# Patient Record
Sex: Female | Born: 1940 | Race: White | Hispanic: No | Smoking: Current every day smoker
Health system: Southern US, Community
[De-identification: ages and names within clinical notes are randomized; demographics above are authoritative.]

## PROBLEM LIST (undated history)

## (undated) DIAGNOSIS — E119 Type 2 diabetes mellitus without complications: Secondary | ICD-10-CM

## (undated) DIAGNOSIS — M170 Bilateral primary osteoarthritis of knee: Secondary | ICD-10-CM

## (undated) DIAGNOSIS — N201 Calculus of ureter: Secondary | ICD-10-CM

## (undated) DIAGNOSIS — S72342D Displaced spiral fracture of shaft of left femur, subsequent encounter for closed fracture with routine healing: Secondary | ICD-10-CM

## (undated) DIAGNOSIS — K7460 Unspecified cirrhosis of liver: Secondary | ICD-10-CM

## (undated) DIAGNOSIS — I447 Left bundle-branch block, unspecified: Secondary | ICD-10-CM

## (undated) DIAGNOSIS — G4733 Obstructive sleep apnea (adult) (pediatric): Secondary | ICD-10-CM

## (undated) DIAGNOSIS — I509 Heart failure, unspecified: Secondary | ICD-10-CM

## (undated) DIAGNOSIS — K219 Gastro-esophageal reflux disease without esophagitis: Secondary | ICD-10-CM

## (undated) DIAGNOSIS — I480 Paroxysmal atrial fibrillation: Secondary | ICD-10-CM

## (undated) DIAGNOSIS — E785 Hyperlipidemia, unspecified: Secondary | ICD-10-CM

## (undated) DIAGNOSIS — F0280 Dementia in other diseases classified elsewhere without behavioral disturbance: Secondary | ICD-10-CM

## (undated) DIAGNOSIS — J449 Chronic obstructive pulmonary disease, unspecified: Secondary | ICD-10-CM

## (undated) DIAGNOSIS — I2510 Atherosclerotic heart disease of native coronary artery without angina pectoris: Secondary | ICD-10-CM

## (undated) DIAGNOSIS — I70235 Atherosclerosis of native arteries of right leg with ulceration of other part of foot: Secondary | ICD-10-CM

## (undated) DIAGNOSIS — F32A Depression, unspecified: Secondary | ICD-10-CM

## (undated) DIAGNOSIS — I10 Essential (primary) hypertension: Secondary | ICD-10-CM

## (undated) DIAGNOSIS — F329 Major depressive disorder, single episode, unspecified: Secondary | ICD-10-CM

## (undated) DIAGNOSIS — E559 Vitamin D deficiency, unspecified: Secondary | ICD-10-CM

## (undated) DIAGNOSIS — D229 Melanocytic nevi, unspecified: Secondary | ICD-10-CM

## (undated) HISTORY — DX: Calculus of ureter: N20.1

## (undated) HISTORY — DX: Bilateral primary osteoarthritis of knee: M17.0

## (undated) HISTORY — DX: Melanocytic nevi, unspecified: D22.9

## (undated) HISTORY — DX: Displaced spiral fracture of shaft of left femur, subsequent encounter for closed fracture with routine healing: S72.342D

## (undated) HISTORY — PX: SPINE SURGERY: SHX786

## (undated) HISTORY — PX: CHOLECYSTECTOMY: SHX55

## (undated) HISTORY — PX: STRABISMUS SURGERY: SHX218

## (undated) HISTORY — DX: Hyperlipidemia, unspecified: E78.5

## (undated) HISTORY — DX: Gastro-esophageal reflux disease without esophagitis: K21.9

## (undated) HISTORY — DX: Atherosclerosis of native arteries of right leg with ulceration of other part of foot: I70.235

---
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
## 2019-03-28 ENCOUNTER — Encounter: Payer: Self-pay | Admitting: Family Medicine

## 2019-03-28 ENCOUNTER — Ambulatory Visit (INDEPENDENT_AMBULATORY_CARE_PROVIDER_SITE_OTHER): Payer: Medicare Other | Admitting: Family Medicine

## 2019-03-28 ENCOUNTER — Other Ambulatory Visit: Payer: Self-pay

## 2019-03-28 VITALS — BP 110/50 | HR 64 | Temp 97.7°F | Resp 15 | Ht 68.0 in | Wt 165.5 lb

## 2019-03-28 DIAGNOSIS — K7469 Other cirrhosis of liver: Secondary | ICD-10-CM

## 2019-03-28 DIAGNOSIS — I5022 Chronic systolic (congestive) heart failure: Secondary | ICD-10-CM

## 2019-03-28 DIAGNOSIS — M623 Immobility syndrome (paraplegic): Secondary | ICD-10-CM | POA: Insufficient documentation

## 2019-03-28 DIAGNOSIS — F329 Major depressive disorder, single episode, unspecified: Secondary | ICD-10-CM

## 2019-03-28 DIAGNOSIS — D229 Melanocytic nevi, unspecified: Secondary | ICD-10-CM | POA: Insufficient documentation

## 2019-03-28 DIAGNOSIS — R262 Difficulty in walking, not elsewhere classified: Secondary | ICD-10-CM | POA: Insufficient documentation

## 2019-03-28 DIAGNOSIS — G309 Alzheimer's disease, unspecified: Secondary | ICD-10-CM | POA: Insufficient documentation

## 2019-03-28 DIAGNOSIS — R0683 Snoring: Secondary | ICD-10-CM | POA: Insufficient documentation

## 2019-03-28 DIAGNOSIS — F984 Stereotyped movement disorders: Secondary | ICD-10-CM | POA: Insufficient documentation

## 2019-03-28 DIAGNOSIS — R29898 Other symptoms and signs involving the musculoskeletal system: Secondary | ICD-10-CM | POA: Insufficient documentation

## 2019-03-28 DIAGNOSIS — R600 Localized edema: Secondary | ICD-10-CM | POA: Insufficient documentation

## 2019-03-28 DIAGNOSIS — G479 Sleep disorder, unspecified: Secondary | ICD-10-CM | POA: Insufficient documentation

## 2019-03-28 DIAGNOSIS — M170 Bilateral primary osteoarthritis of knee: Secondary | ICD-10-CM | POA: Insufficient documentation

## 2019-03-28 DIAGNOSIS — G4722 Circadian rhythm sleep disorder, advanced sleep phase type: Secondary | ICD-10-CM | POA: Insufficient documentation

## 2019-03-28 DIAGNOSIS — I491 Atrial premature depolarization: Secondary | ICD-10-CM | POA: Insufficient documentation

## 2019-03-28 DIAGNOSIS — R52 Pain, unspecified: Secondary | ICD-10-CM | POA: Insufficient documentation

## 2019-03-28 DIAGNOSIS — R4182 Altered mental status, unspecified: Secondary | ICD-10-CM | POA: Insufficient documentation

## 2019-03-28 DIAGNOSIS — L89622 Pressure ulcer of left heel, stage 2: Secondary | ICD-10-CM

## 2019-03-28 DIAGNOSIS — I447 Left bundle-branch block, unspecified: Secondary | ICD-10-CM | POA: Insufficient documentation

## 2019-03-28 DIAGNOSIS — N201 Calculus of ureter: Secondary | ICD-10-CM | POA: Insufficient documentation

## 2019-03-28 DIAGNOSIS — G4719 Other hypersomnia: Secondary | ICD-10-CM | POA: Insufficient documentation

## 2019-03-28 DIAGNOSIS — R41841 Cognitive communication deficit: Secondary | ICD-10-CM | POA: Insufficient documentation

## 2019-03-28 DIAGNOSIS — I70235 Atherosclerosis of native arteries of right leg with ulceration of other part of foot: Secondary | ICD-10-CM | POA: Insufficient documentation

## 2019-03-28 DIAGNOSIS — R5383 Other fatigue: Secondary | ICD-10-CM | POA: Insufficient documentation

## 2019-03-28 DIAGNOSIS — I4891 Unspecified atrial fibrillation: Secondary | ICD-10-CM

## 2019-03-28 DIAGNOSIS — E559 Vitamin D deficiency, unspecified: Secondary | ICD-10-CM | POA: Insufficient documentation

## 2019-03-28 DIAGNOSIS — K5900 Constipation, unspecified: Secondary | ICD-10-CM | POA: Insufficient documentation

## 2019-03-28 DIAGNOSIS — J439 Emphysema, unspecified: Secondary | ICD-10-CM

## 2019-03-28 DIAGNOSIS — L89602 Pressure ulcer of unspecified heel, stage 2: Secondary | ICD-10-CM

## 2019-03-28 DIAGNOSIS — E119 Type 2 diabetes mellitus without complications: Secondary | ICD-10-CM | POA: Diagnosis not present

## 2019-03-28 DIAGNOSIS — G64 Other disorders of peripheral nervous system: Secondary | ICD-10-CM | POA: Insufficient documentation

## 2019-03-28 DIAGNOSIS — G4701 Insomnia due to medical condition: Secondary | ICD-10-CM | POA: Insufficient documentation

## 2019-03-28 DIAGNOSIS — I480 Paroxysmal atrial fibrillation: Secondary | ICD-10-CM | POA: Insufficient documentation

## 2019-03-28 DIAGNOSIS — L602 Onychogryphosis: Secondary | ICD-10-CM

## 2019-03-28 DIAGNOSIS — R638 Other symptoms and signs concerning food and fluid intake: Secondary | ICD-10-CM | POA: Insufficient documentation

## 2019-03-28 DIAGNOSIS — F0280 Dementia in other diseases classified elsewhere without behavioral disturbance: Secondary | ICD-10-CM | POA: Insufficient documentation

## 2019-03-28 DIAGNOSIS — I25118 Atherosclerotic heart disease of native coronary artery with other forms of angina pectoris: Secondary | ICD-10-CM | POA: Insufficient documentation

## 2019-03-28 DIAGNOSIS — G9341 Metabolic encephalopathy: Secondary | ICD-10-CM | POA: Insufficient documentation

## 2019-03-28 DIAGNOSIS — L539 Erythematous condition, unspecified: Secondary | ICD-10-CM | POA: Insufficient documentation

## 2019-03-28 DIAGNOSIS — L89329 Pressure ulcer of left buttock, unspecified stage: Secondary | ICD-10-CM

## 2019-03-28 DIAGNOSIS — G4733 Obstructive sleep apnea (adult) (pediatric): Secondary | ICD-10-CM | POA: Insufficient documentation

## 2019-03-28 MED ORDER — IPRATROPIUM-ALBUTEROL 0.5-2.5 (3) MG/3ML IN SOLN: 3.0000 mL | Freq: Two times a day (BID) | RESPIRATORY_TRACT | 3 refills | Status: DC

## 2019-03-28 MED ORDER — UNABLE TO FIND: 3 refills | Status: DC

## 2019-03-28 MED ORDER — ALBUTEROL SULFATE (2.5 MG/3ML) 0.083% IN NEBU: 2.5000 mg | INHALATION_SOLUTION | Freq: Four times a day (QID) | RESPIRATORY_TRACT | 3 refills | Status: DC | PRN

## 2019-03-28 MED ORDER — BLOOD GLUCOSE METER KIT: PACK | 0 refills | Status: DC

## 2019-03-28 NOTE — Progress Notes (Signed)
Subjective:  Patient ID: Heather Newman, female    DOB: 1940-06-01  Age: 78 y.o. MRN: [id number]  CC:  Chief Complaint  Patient presents with  . New Patient (Initial Visit)    establish care, look at wounds on both heels      HPI  HPI  Patient is new to clinic recently moved down here from [facility] in [place].  History of dementia/cognitive changes.  Granddaughter and friend are here to speak and be historians on her behalf.  They are trying to get her into the [facility] but due to Covid there has been a reduction in admissions and it looks like it might be another month or 2 before they can get her in there.  She has a very extensive history including but not limited to A. fib, CHF, atherosclerosis, pressure ulcers, constipation, liver/metabolic encephalopathy, osteoarthritis, difficulty walking wheelchair-bound among others.  Biggest concern from granddaughter today is to make sure that her pressure wounds are healing appropriately.  She reports that they have been doing a lot of care to them to try to get them to heal.  If she still needs to continue to take lactulose as her ammonia level was elevated at the last check from other facility.  And if they can get help getting blood sugar diabetic supplies and possibly chucks/depends for helping get care at home.  Additionally need refill on her nebulizer medications.  They deny having any changes in her bowel or bladder habits.  Reports that she sleeps fragmented but good.  Outside of the skin pressure ulcers that she has everything else is doing okay.  Reports that she eats and drinks well. Denies having any excessive confusion or depression reports that she is done really well since they moved her down here.  She did have a little bit of a nosebleed but those have gone away.  Reports taking all her medications without issue or side effect.  Need to get request for records so that we can get copies of all of her labs.   Additionally they report that they believe she has had a pneumonia vaccine flu vaccine and is possibly up-to-date on her tetanus but they are unsure.  Today patient denies signs and symptoms of COVID 19 infection including fever, chills, cough, shortness of breath, and headache. Past Medical, Surgical, Social History, Allergies, and Medications have been Reviewed.   Past Medical History:  Diagnosis Date  . Altered mental status, unspecified   . Alzheimer's disease, unspecified (CODE) ([place])   . Atherosclerosis of native arteries of right leg with ulceration of other part of foot ([place])   . Atherosclerotic heart disease of native coronary artery without angina pectoris   . Atrial premature depolarization   . Bilateral primary osteoarthritis of knee   . Calculus of ureter   . Chronic systolic (congestive) heart failure ([place])   . Circadian rhythm sleep disorder, advanced sleep phase type   . Cognitive communication deficit   . Constipation, unspecified   . Dementia in other diseases classified elsewhere without behavioral disturbance ([place])   . Difficulty in walking, not elsewhere classified   . Displaced spiral fracture of shaft of left femur, subsequent encounter for closed fracture with routine healing   . Emphysema, unspecified ([place])   . Erythematous condition, unspecified   . GERD (gastroesophageal reflux disease)   . Hyperlipidemia   . Hypertension   . Immobility syndrome (paraplegic)   . Insomnia due to medical condition   .  Left bundle-branch block, unspecified   . Localized edema   . Major depressive disorder, single episode, unspecified   . Melanocytic nevi, unspecified   . Metabolic encephalopathy   . Nicotine dependence, unspecified, uncomplicated   . Obstructive sleep apnea (adult) (pediatric)   . Other cirrhosis of liver ([place])   . Other disorders of peripheral nervous system   . Other fatigue   . Other hypersomnia   . Other symptoms and signs concerning food and fluid  intake   . Other symptoms and signs involving the musculoskeletal system   . Pain, unspecified   . Sleep disorder   . Snoring   . Stereotyped movement disorders   . Unspecified atrial fibrillation ([place])   . Vitamin D deficiency, unspecified     Current Meds  Medication Sig  . acetaminophen (TYLENOL) 325 MG tablet Take 650 mg by mouth every 8 (eight) hours.  Marland Kitchen albuterol (PROVENTIL) (2.5 MG/3ML) 0.083% nebulizer solution Take 3 mLs (2.5 mg total) by nebulization every 6 (six) hours as needed for wheezing or shortness of breath.  Marland Kitchen aspirin EC 81 MG tablet Take 81 mg by mouth daily.  . bisacodyl (DULCOLAX) 5 MG EC tablet Take 5 mg by mouth daily as needed for moderate constipation.  Marland Kitchen diltiazem (CARTIA XT) 120 MG 24 hr capsule Take 120 mg by mouth daily.  Marland Kitchen escitalopram (LEXAPRO) 5 MG tablet Take 5 mg by mouth at bedtime.  . famotidine (PEPCID) 20 MG tablet Take 20 mg by mouth 2 (two) times daily.  . furosemide (LASIX) 20 MG tablet Take 20 mg by mouth daily.  Marland Kitchen gabapentin (NEURONTIN) 400 MG capsule Take 400 mg by mouth 2 (two) times daily.  Marland Kitchen lactulose (CHRONULAC) 10 GM/15ML solution Take 30 g by mouth 2 (two) times daily.  Marland Kitchen lisinopril (ZESTRIL) 2.5 MG tablet Take 2.5 mg by mouth daily.  . magnesium hydroxide (MILK OF MAGNESIA) 400 MG/5ML suspension Take 30 mLs by mouth daily as needed for mild constipation.  . methocarbamol (ROBAXIN) 500 MG tablet Take 500 mg by mouth every 8 (eight) hours as needed for muscle spasms.  . mirtazapine (REMERON) 15 MG tablet Take 15 mg by mouth at bedtime.  . pravastatin (PRAVACHOL) 10 MG tablet Take 10 mg by mouth daily.  . sitaGLIPtin (JANUVIA) 50 MG tablet Take 50 mg by mouth daily.  . traMADol (ULTRAM) 50 MG tablet Take 50 mg by mouth every 4 (four) hours.  . Vitamin D, Ergocalciferol, (DRISDOL) 1.25 MG (50000 UT) CAPS capsule Take 50,000 Units by mouth every 7 (seven) days.  . [DISCONTINUED] albuterol (PROVENTIL) (2.5 MG/3ML) 0.083% nebulizer solution  Take 2.5 mg by nebulization every 6 (six) hours as needed for wheezing or shortness of breath.  . [DISCONTINUED] ipratropium-albuterol (DUONEB) 0.5-2.5 (3) MG/3ML SOLN Take 3 mLs by nebulization 2 (two) times daily.  . [DISCONTINUED] ipratropium-albuterol (DUONEB) 0.5-2.5 (3) MG/3ML SOLN Take 3 mLs by nebulization 2 (two) times daily.    ROS:  Review of Systems  Constitutional: Negative.   HENT: Negative.   Eyes: Negative.   Respiratory: Positive for shortness of breath.        Mild shortness of breath at baseline secondary to emphysema  Cardiovascular: Negative.   Gastrointestinal: Negative.   Genitourinary: Negative.   Musculoskeletal: Negative.        Immobile requires lots of assistance  Skin: Negative.        Pressure wound on heels and buttocks.  Neurological: Negative.   Endo/Heme/Allergies: Negative.   Psychiatric/Behavioral: Negative.  Depressed mood on Lexapro family reports doing much better.  All other systems reviewed and are negative.    Objective:   Today's Vitals: BP (!) 110/50   Pulse 64   Temp 97.7 F (36.5 C) (Oral)   Resp 15   Ht _0  (1.727 m)   Wt 165 lb 8 oz (75.1 kg)   SpO2 92%   BMI 25.16 kg/m  Vitals with BMI 03/28/2019  Height _1   Weight 165 lbs 8 oz  BMI 62.86  Systolic 381  Diastolic 50  Pulse 64     Physical Exam Vitals and nursing note reviewed.  Constitutional:      Appearance: Normal appearance. She is well-developed and well-groomed. She is obese.  HENT:     Head: Normocephalic and atraumatic.     Right Ear: External ear normal.     Left Ear: External ear normal.     Nose: Nose normal.     Mouth/Throat:     Mouth: Mucous membranes are moist.     Pharynx: Oropharynx is clear.  Eyes:     General:        Right eye: No discharge.        Left eye: No discharge.     Conjunctiva/sclera: Conjunctivae normal.  Cardiovascular:     Rate and Rhythm: Normal rate and regular rhythm.     Pulses: Normal pulses.     Heart  sounds: Normal heart sounds.  Pulmonary:     Effort: Pulmonary effort is normal.     Breath sounds: Normal breath sounds.  Musculoskeletal:        General: Normal range of motion.     Cervical back: Normal range of motion and neck supple.     Comments: In wheelchair  Skin:    General: Skin is warm.     Comments: Bilateral healing pressure ulcers on heels.  Additional pressure ulcer on buttock.  Neurological:     General: No focal deficit present.     Mental Status: She is alert and oriented to person, place, and time.  Psychiatric:        Attention and Perception: Attention normal.        Mood and Affect: Mood normal. Affect is flat.        Behavior: Behavior normal. Behavior is cooperative.        Thought Content: Thought content normal.        Cognition and Memory: Cognition is impaired.        Judgment: Judgment normal.     Comments: Minimal speech     Assessment   1. Chronic systolic (congestive) heart failure ([place])   2. Atrial fibrillation, unspecified type ([place])   3. Type 2 diabetes mellitus without complication, unspecified whether long term insulin use ([place])   4. Pulmonary emphysema, unspecified emphysema type ([place])   5. Pressure injury of left heel, stage 2 ([place])   6. Pressure injury of skin of left buttock, unspecified injury stage   7. Pressure injury of heel, stage 2, unspecified laterality ([place])   8. Other cirrhosis of liver ([place])   9. Cognitive communication deficit   10. Immobility syndrome (paraplegic)   11. Major depressive disorder with single episode, remission status unspecified   12. Metabolic encephalopathy   13. Nail overgrowth     Tests ordered Orders Placed This Encounter  Procedures  . CBC  . COMPLETE METABOLIC PANEL WITH GFR  . Ammonia  . AMB referral to wound clinic  . Ambulatory  referral to Podiatry     Plan: Please see assessment and plan per problem list above.   Meds ordered this encounter  Medications  . albuterol (PROVENTIL)  (2.5 MG/3ML) 0.083% nebulizer solution    Sig: Take 3 mLs (2.5 mg total) by nebulization every 6 (six) hours as needed for wheezing or shortness of breath.    Dispense:  75 mL    Refill:  3    Order Specific Question:   Supervising Provider    Answer:   SIMPSON, MARGARET E [[id number]]  . DISCONTD: ipratropium-albuterol (DUONEB) 0.5-2.5 (3) MG/3ML SOLN    Sig: Take 3 mLs by nebulization 2 (two) times daily.    Dispense:  360 mL    Refill:  3    Order Specific Question:   Supervising Provider    Answer:   SIMPSON, MARGARET E [[id number]]  . UNABLE TO FIND    Sig: Chuck pads 60/mth    Dispense:  60 each    Refill:  3  . UNABLE TO FIND    Sig: Depends Diapers up to 72" 120/mth    Dispense:  120 each    Refill:  3  . blood glucose meter kit and supplies    Sig: Dispense based on patient and insurance preference. Use up to four times daily as directed. (FOR ICD-10 E10.9, E11.9).    Dispense:  1 each    Refill:  0    Order Specific Question:   Number of strips    Answer:   100    Order Specific Question:   Number of lancets    Answer:   100    Patient to follow-up in 1 month  .  [name], NP

## 2019-03-28 NOTE — Patient Instructions (Addendum)
I appreciate the opportunity to provide you with care for your health and wellness. It was a pleasure to meet you all.  Today we discussed: establish care   Follow up:1-2 months   Labs today at [place] today  Continue all medications for now.  We will look at labs and decided if anything needs to be addressed.  Possible need for cardiology referral given history.  We will look at that pending if she can not get in [facility] soon.  Please continue to practice social distancing to keep you, your family, and our community safe.  If you must go out, please wear a mask and practice good handwashing.  It was a pleasure to see you and I look forward to continuing to work together on your health and well-being. Please do not hesitate to call the office if you need care or have questions about your care.  Have a wonderful day and week. With Gratitude, Cherly Beach, DNP, AGNP-BC

## 2019-03-29 ENCOUNTER — Telehealth: Payer: Self-pay

## 2019-03-29 ENCOUNTER — Other Ambulatory Visit: Payer: Self-pay

## 2019-03-29 DIAGNOSIS — E118 Type 2 diabetes mellitus with unspecified complications: Secondary | ICD-10-CM | POA: Insufficient documentation

## 2019-03-29 DIAGNOSIS — J439 Emphysema, unspecified: Secondary | ICD-10-CM

## 2019-03-29 DIAGNOSIS — L89622 Pressure ulcer of left heel, stage 2: Secondary | ICD-10-CM | POA: Insufficient documentation

## 2019-03-29 DIAGNOSIS — L602 Onychogryphosis: Secondary | ICD-10-CM | POA: Insufficient documentation

## 2019-03-29 LAB — COMPLETE METABOLIC PANEL WITH GFR
AG Ratio: 1 (calc) (ref 1.0–2.5)
ALT: 21 U/L (ref 6–29)
AST: 27 U/L (ref 10–35)
Albumin: 3.3 g/dL — ABNORMAL LOW (ref 3.6–5.1)
Alkaline phosphatase (APISO): 122 U/L (ref 37–153)
BUN/Creatinine Ratio: 23 (calc) — ABNORMAL HIGH (ref 6–22)
BUN: 13 mg/dL (ref 7–25)
CO2: 34 mmol/L — ABNORMAL HIGH (ref 20–32)
Calcium: 9.3 mg/dL (ref 8.6–10.4)
Chloride: 105 mmol/L (ref 98–110)
Creat: 0.57 mg/dL — ABNORMAL LOW (ref 0.60–0.93)
GFR, Est African American: 104 mL/min/{1.73_m2} (ref >=60)
GFR, Est Non African American: 89 mL/min/{1.73_m2} (ref >=60)
Globulin: 3.3 g/dL (calc) (ref 1.9–3.7)
Glucose, Bld: 103 mg/dL (ref 65–139)
Potassium: 4.2 mmol/L (ref 3.5–5.3)
Sodium: 143 mmol/L (ref 135–146)
Total Bilirubin: 0.5 mg/dL (ref 0.2–1.2)
Total Protein: 6.6 g/dL (ref 6.1–8.1)

## 2019-03-29 LAB — CBC
HCT: 28.9 % — ABNORMAL LOW (ref 35.0–45.0)
Hemoglobin: 9.4 g/dL — ABNORMAL LOW (ref 11.7–15.5)
MCH: 29.5 pg (ref 27.0–33.0)
MCHC: 32.5 g/dL (ref 32.0–36.0)
MCV: 90.6 fL (ref 80.0–100.0)
MPV: 11.5 fL (ref 7.5–12.5)
Platelets: 245 10*3/uL (ref 140–400)
RBC: 3.19 10*6/uL — ABNORMAL LOW (ref 3.80–5.10)
RDW: 12.8 % (ref 11.0–15.0)
WBC: 5.3 10*3/uL (ref 3.8–10.8)

## 2019-03-29 LAB — AMMONIA: Ammonia: 46 umol/L (ref <=72)

## 2019-03-29 MED ORDER — IPRATROPIUM-ALBUTEROL 0.5-2.5 (3) MG/3ML IN SOLN: 3.0000 mL | Freq: Two times a day (BID) | RESPIRATORY_TRACT | 3 refills | Status: DC

## 2019-03-29 NOTE — Telephone Encounter (Signed)
Caretaker advised that script has been called in under Dr.Simpson's name. Also made aware that medicare will only pay for one of them because both of them have albuterol in them. Jarrett Soho wants patient on Duoneb. All with verbal understanding.

## 2019-03-29 NOTE — Telephone Encounter (Signed)
PT LVM that she can't get the Neb solution unless called in by Dr Moshe Cipro, please call her

## 2019-04-02 ENCOUNTER — Telehealth: Payer: Self-pay | Admitting: *Deleted

## 2019-04-02 NOTE — Telephone Encounter (Signed)
Maryjo called for pt she said they cannot find the 325 mg iron over the counter and was wondering if this needed to be a prescription. She also knew the diapers for the pt were sent to [facility]  But was wondering if the pt could get some pads to go on the bed under her sent to [facility]. She would like a call back at [phone]

## 2019-04-02 NOTE — Telephone Encounter (Signed)
Spoke with caregiver. Let her know that chuck pads were sent in on the same day as diapers were. Also let her know which iron tablet to look for with verbal understanding.

## 2019-04-03 ENCOUNTER — Other Ambulatory Visit: Payer: Self-pay

## 2019-04-03 ENCOUNTER — Encounter: Payer: Self-pay | Admitting: Orthopedic Surgery

## 2019-04-03 ENCOUNTER — Ambulatory Visit (INDEPENDENT_AMBULATORY_CARE_PROVIDER_SITE_OTHER): Payer: Medicare Other | Admitting: Orthopedic Surgery

## 2019-04-03 VITALS — Ht 68.0 in | Wt 165.0 lb

## 2019-04-03 DIAGNOSIS — M5432 Sciatica, left side: Secondary | ICD-10-CM

## 2019-04-03 DIAGNOSIS — R296 Repeated falls: Secondary | ICD-10-CM | POA: Diagnosis not present

## 2019-04-03 DIAGNOSIS — B351 Tinea unguium: Secondary | ICD-10-CM

## 2019-04-03 DIAGNOSIS — L89622 Pressure ulcer of left heel, stage 2: Secondary | ICD-10-CM | POA: Diagnosis not present

## 2019-04-03 DIAGNOSIS — R531 Weakness: Secondary | ICD-10-CM

## 2019-04-03 DIAGNOSIS — L89612 Pressure ulcer of right heel, stage 2: Secondary | ICD-10-CM | POA: Diagnosis not present

## 2019-04-03 DIAGNOSIS — L89602 Pressure ulcer of unspecified heel, stage 2: Secondary | ICD-10-CM | POA: Insufficient documentation

## 2019-04-03 DIAGNOSIS — L89329 Pressure ulcer of left buttock, unspecified stage: Secondary | ICD-10-CM | POA: Insufficient documentation

## 2019-04-03 DIAGNOSIS — E118 Type 2 diabetes mellitus with unspecified complications: Secondary | ICD-10-CM | POA: Diagnosis not present

## 2019-04-03 MED ORDER — PREDNISONE 50 MG PO TABS: ORAL_TABLET | ORAL | 0 refills | Status: DC

## 2019-04-03 NOTE — Assessment & Plan Note (Signed)
Patient is new to the clinic.  Appears to be on several medications to help control congestive heart failure.  Is trying to get into the [facility] awaiting placement at this time.  Will manage medications until she is able to be accepted which they feel is going to be within the next month or 2.  Advised to continue taking all medications as previously prescribed at this time blood pressure is on the lower end and holding but no complaints of side effects at this time.

## 2019-04-03 NOTE — Assessment & Plan Note (Addendum)
They have done really good at making sure that her wounds have been taken care of.  Referral to wound clinic to make sure that they are doing everything that we can for them.

## 2019-04-03 NOTE — Assessment & Plan Note (Signed)
Controlled advised to continue medications at this time.

## 2019-04-03 NOTE — Assessment & Plan Note (Signed)
Has had elevation in ammonia levels.  Will be checking labs to make sure that liver is doing all right.

## 2019-04-03 NOTE — Assessment & Plan Note (Signed)
Controlled, per family improved.  Continue Lexapro at this time.

## 2019-04-03 NOTE — Assessment & Plan Note (Signed)
Has been on lactulose for elevated ammonia levels.  Will be checking labs to see if this level is controlled.  Continue lactulose at this time.

## 2019-04-03 NOTE — Assessment & Plan Note (Signed)
Family is encouraged to check blood sugar daily as directed. Continue current medications. . Educated on importance of maintain a well balanced diabetic friendly diet.   She is reminded the importance of maintaining  good blood sugars,  taking medications as directed, daily foot care, annual eye exams. Additionally educated about keeping good control over blood pressure and cholesterol as well.

## 2019-04-03 NOTE — Assessment & Plan Note (Signed)
Wheelchair-bound.  Have provided prescription for check pads and depends.

## 2019-04-03 NOTE — Assessment & Plan Note (Signed)
They have done really good at making sure that her wounds have been taken care of.  Referral to wound clinic to make sure that they are doing everything that we can for them.

## 2019-04-03 NOTE — Assessment & Plan Note (Signed)
Needs toenails cut.  Provided with podiatry referral.  Appreciate collaboration in her care please let PCP know if there is anything that we can do interim.

## 2019-04-03 NOTE — Assessment & Plan Note (Signed)
Controlled needs refills of her medications provided today.

## 2019-04-03 NOTE — Assessment & Plan Note (Signed)
Has not been much in communication as she was.  There was a history is whether or not she had dementia.  We will possibly look at doing a work-up of this if she is unable to get to the [facility] in the next month.

## 2019-04-03 NOTE — Progress Notes (Signed)
Office Visit Note   Patient: Heather Newman           Date of Birth: Nov 07, 1940           MRN: [id number] Visit Date: 04/03/2019              Requested by: [name], NP [address] [place],  [place] [address] PCP: [name], NP  Chief Complaint  Patient presents with  . Left Foot - Pain  . Right Foot - Pain      HPI: The patient is a 79 year old woman who presents today for initial evaluation of bilateral pressure ulcers to her heels, onychomycosis as well as generalized weakness.  Her daughter reports that she had a femur fracture as well as hip replacement on the left earlier this year she was out of state and skilled nursing.  States she did not receive physical therapy.  States she has been bedbound for greater than 6 months.  They were concerned for the heel ulcers.  They have been floating her heels as long as she has been staying with them which has been for the last several weeks these have shown excellent improvement.  They have difficulty getting her from a seated to a standing position in the home.  She is complaining of low back as well as left lateral hip pain this radiates down her posterior thigh and around her anterior shin.  She does have a history of lumbar spine surgery.  She has follow-up scheduled with podiatry.  She has not yet established care.  They are requesting a nail trim and foot evaluation today.  Assessment & Plan: Visit Diagnoses: No diagnosis found.  Plan: The pressure ulcers are nearly healed the left heel ulcer is fully healed the right ulcer is quite small they will continue floating the heels and doing antibacterial ointment and a Band-Aid.  They will advance her weightbearing as she tolerates using assistive devices.  I will order physical therapy to work on strengthening of the lower extremities and gait training.  We will start her on a prednisone taper for her radicular symptoms on the left.  Nails were trimmed today x10 as patient  is unable to safely trim her own nails  Daughter would like to keep follow-up with podiatry to establish care.  Discussed referring her to spine surgery for her back she will follow-up in office in 4 weeks  Follow-Up Instructions: No follow-ups on file.   Left Hip Exam   Tenderness  The patient is experiencing no tenderness.   Range of Motion  The patient has normal left hip ROM.  Other  Sensation: normal   Back Exam   Tenderness  The patient is experiencing no tenderness.   Muscle Strength  Right Quadriceps:  4/5  Left Quadriceps:  3/5   Tests  Straight leg raise right: negative Straight leg raise left: positive      Patient is alert, oriented, no adenopathy, well-dressed, normal affect, normal respiratory effort. Evaluation bilateral lower extremities there is no edema no erythema.  She does have callused ulceration to the posterior heel on the left this was debrided underlying full epithelialization.  There is no erythema no breakdown of her skin on the left.  On the right nonviable tissue debrided underlying ulceration is 3 mm in diameter 2 mm deep there is pink tissue in the wound bed no active drainage no surrounding erythema no edema no sign of infection she does have thickened and onychomycotic  nails x10 these were trimmed today in the office without incident  Imaging: No results found. No images are attached to the encounter.  Labs: No results found for: HGBA1C, ESRSEDRATE, CRP, LABURIC, REPTSTATUS, GRAMSTAIN, CULT, LABORGA   No results found for: ALBUMIN, PREALBUMIN, LABURIC  No results found for: MG No results found for: VD25OH  No results found for: PREALBUMIN CBC EXTENDED Latest Ref Rng & Units 03/28/2019  WBC 3.8 - 10.8 Thousand/uL 5.3  RBC 3.80 - 5.10 Million/uL 3.19(L)  HGB 11.7 - 15.5 g/dL 9.4(L)  HCT 35.0 - 45.0 % 28.9(L)  PLT 140 - 400 Thousand/uL 245     Body mass index is 25.09 kg/m.  Orders:  No orders of the defined types were  placed in this encounter.  No orders of the defined types were placed in this encounter.    Procedures: No procedures performed  Clinical Data: No additional findings.  ROS:  All other systems negative, except as noted in the HPI. Review of Systems  Objective: Vital Signs: Ht 5\' 8"  (1.727 m)   Wt 165 lb (74.8 kg)   BMI 25.09 kg/m   Specialty Comments:  No specialty comments available.  PMFS History: Patient Active Problem List   Diagnosis Date Noted  . Pressure injury of skin of left buttock 04/03/2019  . Pressure injury of heel, stage 2 ([place]) 04/03/2019  . Diabetes mellitus type 2 with complications ([place]) 99991111  . Nail overgrowth 03/29/2019  . Pressure injury of left heel, stage 2 ([place]) 03/29/2019  . Obstructive sleep apnea (adult) (pediatric)   . Altered mental status, unspecified   . Atherosclerosis of native arteries of right leg with ulceration of other part of foot ([place])   . Atrial premature depolarization   . Calculus of ureter   . Chronic systolic (congestive) heart failure ([place])   . Cognitive communication deficit   . Dementia in other diseases classified elsewhere without behavioral disturbance ([place])   . Emphysema, unspecified ([place])   . Immobility syndrome (paraplegic)   . Left bundle-branch block, unspecified   . Major depressive disorder, single episode, unspecified   . Metabolic encephalopathy   . Other disorders of peripheral nervous system   . Other hypersomnia   . Other symptoms and signs concerning food and fluid intake   . Pain, unspecified   . Sleep disorder   . Stereotyped movement disorders   . Unspecified atrial fibrillation ([place])   . Alzheimer's disease, unspecified (CODE) ([place])   . Atherosclerotic heart disease of native coronary artery without angina pectoris   . Bilateral primary osteoarthritis of knee   . Circadian rhythm sleep disorder, advanced sleep phase type   . Constipation, unspecified   . Difficulty in walking, not  elsewhere classified   . Insomnia due to medical condition   . Erythematous condition, unspecified   . Localized edema   . Melanocytic nevi, unspecified   . Other cirrhosis of liver ([place])   . Other fatigue   . Snoring   . Other symptoms and signs involving the musculoskeletal system   . Vitamin D deficiency, unspecified    Past Medical History:  Diagnosis Date  . Altered mental status, unspecified   . Alzheimer's disease, unspecified (CODE) ([place])   . Atherosclerosis of native arteries of right leg with ulceration of other part of foot ([place])   . Atherosclerotic heart disease of native coronary artery without angina pectoris   . Atrial premature depolarization   . Bilateral primary osteoarthritis of knee   .  Calculus of ureter   . Chronic systolic (congestive) heart failure ([place])   . Circadian rhythm sleep disorder, advanced sleep phase type   . Cognitive communication deficit   . Constipation, unspecified   . Dementia in other diseases classified elsewhere without behavioral disturbance ([place])   . Difficulty in walking, not elsewhere classified   . Displaced spiral fracture of shaft of left femur, subsequent encounter for closed fracture with routine healing   . Emphysema, unspecified ([place])   . Erythematous condition, unspecified   . GERD (gastroesophageal reflux disease)   . Hyperlipidemia   . Hypertension   . Immobility syndrome (paraplegic)   . Insomnia due to medical condition   . Left bundle-branch block, unspecified   . Localized edema   . Major depressive disorder, single episode, unspecified   . Melanocytic nevi, unspecified   . Metabolic encephalopathy   . Nicotine dependence, unspecified, uncomplicated   . Obstructive sleep apnea (adult) (pediatric)   . Other cirrhosis of liver ([place])   . Other disorders of peripheral nervous system   . Other fatigue   . Other hypersomnia   . Other symptoms and signs concerning food and fluid intake   . Other symptoms and signs  involving the musculoskeletal system   . Pain, unspecified   . Sleep disorder   . Snoring   . Stereotyped movement disorders   . Unspecified atrial fibrillation ([place])   . Vitamin D deficiency, unspecified     No family history on file.  No past surgical history on file. Social History   Occupational History  . Not on file  Tobacco Use  . Smoking status: Current Every Day Smoker  . Smokeless tobacco: Never Used  Substance and Sexual Activity  . Alcohol use: Not on file  . Drug use: Not on file  . Sexual activity: Not on file

## 2019-04-05 ENCOUNTER — Ambulatory Visit: Payer: Medicare Other | Admitting: Podiatry

## 2019-04-09 ENCOUNTER — Telehealth: Payer: Self-pay | Admitting: *Deleted

## 2019-04-09 ENCOUNTER — Other Ambulatory Visit: Payer: Self-pay

## 2019-04-09 MED ORDER — VITAMIN D (ERGOCALCIFEROL) 1.25 MG (50000 UNIT) PO CAPS: 50000.0000 [IU] | ORAL_CAPSULE | ORAL | 2 refills | Status: DC

## 2019-04-09 MED ORDER — LISINOPRIL 2.5 MG PO TABS: 2.5000 mg | ORAL_TABLET | Freq: Every day | ORAL | 2 refills | Status: DC

## 2019-04-09 MED ORDER — PRAVASTATIN SODIUM 10 MG PO TABS: 10.0000 mg | ORAL_TABLET | Freq: Every day | ORAL | 2 refills | Status: DC

## 2019-04-09 NOTE — Telephone Encounter (Signed)
Caregiver states you were going to review records and then decide which inhaler you wanted patient on before you called one in for her.

## 2019-04-09 NOTE — Telephone Encounter (Signed)
Heather Newman called about pt medication. She needs her vitamin D, lisinopril, prevastatin, and the steroid inhaler Jarrett Soho was going to call in sent to [facility] in [place]

## 2019-04-10 ENCOUNTER — Other Ambulatory Visit: Payer: Self-pay | Admitting: Family Medicine

## 2019-04-10 DIAGNOSIS — J439 Emphysema, unspecified: Secondary | ICD-10-CM

## 2019-04-10 MED ORDER — MOMETASONE FURO-FORMOTEROL FUM 100-5 MCG/ACT IN AERO: 2.0000 | INHALATION_SPRAY | Freq: Every day | RESPIRATORY_TRACT | 1 refills | Status: DC

## 2019-04-10 NOTE — Telephone Encounter (Signed)
Advised caregiver that Heather Newman has been called in. [facility] is only accepting Covid patients at this time. Caregivers are wanting assistance with bathing and that type of thing. Patient has Medicaid. I will look into it and see what I can find out.

## 2019-04-11 ENCOUNTER — Ambulatory Visit (HOSPITAL_COMMUNITY): Payer: Medicare Other | Admitting: Physical Therapy

## 2019-04-11 ENCOUNTER — Encounter: Payer: Self-pay | Admitting: Family Medicine

## 2019-04-11 ENCOUNTER — Telehealth (HOSPITAL_COMMUNITY): Payer: Self-pay | Admitting: Physical Therapy

## 2019-04-11 ENCOUNTER — Other Ambulatory Visit: Payer: Self-pay | Admitting: Family Medicine

## 2019-04-11 ENCOUNTER — Telehealth: Payer: Self-pay

## 2019-04-11 MED ORDER — CEPHALEXIN 250 MG PO CAPS: 250.0000 mg | ORAL_CAPSULE | Freq: Three times a day (TID) | ORAL | 0 refills | Status: DC

## 2019-04-11 NOTE — Telephone Encounter (Signed)
Will start keflex , needs to see NP in office

## 2019-04-11 NOTE — Telephone Encounter (Signed)
pt is not having a good day physically and can not get out of bed shehas recheduled this appt

## 2019-04-11 NOTE — Telephone Encounter (Signed)
Daughter called and said that her mothers right arm had redness spreading up her arm and she had a slight temp. She thought it was cellulitis and I advised UC since Jarrett Soho was not here today. Daughter states she does not want to take her to UC because of all the sick people there and wante to send a picture through mychart for the Dr that was here to look at and see if she would just prescribe some antibiotics for it because "that's what you do for cellulitis"  I explained that the UC would be able to evaluate her to see if an antibiotic was all she needed or if she needed to go to the ER for treatment. Wanted to still send a picture to Dr Moshe Cipro to see what she said. Message/picture forwarded to her

## 2019-04-12 NOTE — Telephone Encounter (Signed)
Follow up appt scheduled.

## 2019-04-13 ENCOUNTER — Telehealth: Payer: Self-pay | Admitting: *Deleted

## 2019-04-13 ENCOUNTER — Ambulatory Visit: Payer: Medicare Other | Admitting: Family Medicine

## 2019-04-13 NOTE — Telephone Encounter (Signed)
Noted, thank you

## 2019-04-13 NOTE — Telephone Encounter (Signed)
Called Heather Newman (on [place]) let her know recommendations. She said the redness is not spreading and it is not as swollen. The pt was more alert and she would take to urgent care or ED if worsens before Tuesday appt.

## 2019-04-16 ENCOUNTER — Other Ambulatory Visit: Payer: Self-pay

## 2019-04-16 ENCOUNTER — Telehealth: Payer: Self-pay

## 2019-04-16 MED ORDER — LACTULOSE 10 GM/15ML PO SOLN: 30.0000 g | Freq: Two times a day (BID) | ORAL | 0 refills | Status: DC

## 2019-04-16 NOTE — Telephone Encounter (Signed)
Please call in Lactulose for the pt, she has an appointment tomorrow

## 2019-04-16 NOTE — Telephone Encounter (Signed)
Med refilled.

## 2019-04-17 ENCOUNTER — Telehealth: Payer: Self-pay | Admitting: *Deleted

## 2019-04-17 ENCOUNTER — Ambulatory Visit (INDEPENDENT_AMBULATORY_CARE_PROVIDER_SITE_OTHER): Payer: Medicare Other | Admitting: Family Medicine

## 2019-04-17 ENCOUNTER — Other Ambulatory Visit: Payer: Self-pay

## 2019-04-17 ENCOUNTER — Encounter: Payer: Self-pay | Admitting: Family Medicine

## 2019-04-17 VITALS — BP 114/60 | HR 64 | Temp 98.3°F | Resp 15 | Ht 68.0 in | Wt 174.0 lb

## 2019-04-17 DIAGNOSIS — K7469 Other cirrhosis of liver: Secondary | ICD-10-CM | POA: Diagnosis not present

## 2019-04-17 DIAGNOSIS — E118 Type 2 diabetes mellitus with unspecified complications: Secondary | ICD-10-CM | POA: Diagnosis not present

## 2019-04-17 DIAGNOSIS — E119 Type 2 diabetes mellitus without complications: Secondary | ICD-10-CM | POA: Insufficient documentation

## 2019-04-17 MED ORDER — LACTULOSE 10 GM/15ML PO SOLN: 30.0000 g | Freq: Two times a day (BID) | ORAL | 0 refills | Status: DC

## 2019-04-17 MED ORDER — METFORMIN HCL 500 MG PO TABS: 500.0000 mg | ORAL_TABLET | Freq: Every day | ORAL | 1 refills | Status: DC

## 2019-04-17 NOTE — Assessment & Plan Note (Signed)
I have stopped the Januvia and added her back to Metformin once daily.  We will be checking her liver and kidney function in 4 weeks.  If all goes well and Metformin help stabilize her blood sugars we will continue Metformin over Januvia.  Most likely blood sugars are related to diet in addition to prednisone.  Secondary to having high doses of it.  Has been 10 days since her last dose.

## 2019-04-17 NOTE — Patient Instructions (Signed)
Happy New Year! May you have a year filled with hope, love, happiness and laughter.  I appreciate the opportunity to provide you with care for your health and wellness. Today we discussed: increased blood sugars   Follow up: 05/16/2019  Lab 1-3 days before that appt  Please continue to practice social distancing to keep you, your family, and our community safe.  If you must go out, please wear a mask and practice good handwashing.  It was a pleasure to see you and I look forward to continuing to work together on your health and well-being. Please do not hesitate to call the office if you need care or have questions about your care.  Have a wonderful day and week. With Gratitude, Cherly Beach, DNP, AGNP-BC

## 2019-04-17 NOTE — Progress Notes (Signed)
Subjective:  Patient ID: Heather Newman, female    DOB: 1940/10/10  Age: 79 y.o. MRN: [id number]  CC:  Chief Complaint  Patient presents with  . Recurrent Skin Infections    follow up      HPI  Diabetes She presents for her follow-up (Family reports that her blood sugars have been out of control.  Secondary to being put on prednisone from her orthopedic provider.  They report they only did for the 5 days of recommendation.  Blood sugars have ranged from the 130s to the 300s.  They are c) diabetic visit. She has type 2 diabetes mellitus. No MedicAlert identification noted. Her disease course has been fluctuating. There are no hypoglycemic associated symptoms. There are no diabetic associated symptoms. There are no hypoglycemic complications. Symptoms are worsening. There are no diabetic complications. Current diabetic treatment includes diet and oral agent (monotherapy). She is compliant with treatment all of the time. Her weight is stable. She is following a diabetic and generally healthy diet. Meal planning includes avoidance of concentrated sweets and carbohydrate counting. She has not had a previous visit with a dietitian. She never participates in exercise. Her home blood glucose trend is fluctuating dramatically. An ACE inhibitor/angiotensin II receptor blocker is being taken. She sees a podiatrist.Eye exam is not current.   Currently only takes Januvia for diabetic control.  Was on Metformin and the previous skilled nursing facility change that.   Today patient denies signs and symptoms of COVID 19 infection including fever, chills, cough, shortness of breath, and headache. Past Medical, Surgical, Social History, Allergies, and Medications have been Reviewed.   Past Medical History:  Diagnosis Date  . Altered mental status, unspecified   . Alzheimer's disease, unspecified (CODE) ([place])   . Atherosclerosis of native arteries of right leg with ulceration of other part of foot  ([place])   . Atherosclerotic heart disease of native coronary artery without angina pectoris   . Atrial premature depolarization   . Bilateral primary osteoarthritis of knee   . Calculus of ureter   . Chronic systolic (congestive) heart failure ([place])   . Circadian rhythm sleep disorder, advanced sleep phase type   . Cognitive communication deficit   . Constipation, unspecified   . Dementia in other diseases classified elsewhere without behavioral disturbance ([place])   . Difficulty in walking, not elsewhere classified   . Displaced spiral fracture of shaft of left femur, subsequent encounter for closed fracture with routine healing   . Emphysema, unspecified ([place])   . Erythematous condition, unspecified   . GERD (gastroesophageal reflux disease)   . Hyperlipidemia   . Hypertension   . Immobility syndrome (paraplegic)   . Insomnia due to medical condition   . Left bundle-branch block, unspecified   . Localized edema   . Major depressive disorder, single episode, unspecified   . Melanocytic nevi, unspecified   . Metabolic encephalopathy   . Nicotine dependence, unspecified, uncomplicated   . Obstructive sleep apnea (adult) (pediatric)   . Other cirrhosis of liver ([place])   . Other disorders of peripheral nervous system   . Other fatigue   . Other hypersomnia   . Other symptoms and signs concerning food and fluid intake   . Other symptoms and signs involving the musculoskeletal system   . Pain, unspecified   . Sleep disorder   . Snoring   . Stereotyped movement disorders   . Unspecified atrial fibrillation ([place])   . Vitamin D deficiency, unspecified  Current Meds  Medication Sig  . acetaminophen (TYLENOL) 325 MG tablet Take 650 mg by mouth every 8 (eight) hours.  Marland Kitchen albuterol (PROVENTIL) (2.5 MG/3ML) 0.083% nebulizer solution Take 3 mLs (2.5 mg total) by nebulization every 6 (six) hours as needed for wheezing or shortness of breath.  Marland Kitchen aspirin EC 81 MG tablet Take 81 mg by mouth  daily.  . bisacodyl (DULCOLAX) 5 MG EC tablet Take 5 mg by mouth daily as needed for moderate constipation.  . blood glucose meter kit and supplies Dispense based on patient and insurance preference. Use up to four times daily as directed. (FOR ICD-10 E10.9, E11.9).  . cephALEXin (KEFLEX) 250 MG capsule Take 1 capsule (250 mg total) by mouth 3 (three) times daily.  Marland Kitchen diltiazem (CARTIA XT) 120 MG 24 hr capsule Take 120 mg by mouth daily.  Marland Kitchen escitalopram (LEXAPRO) 5 MG tablet Take 5 mg by mouth at bedtime.  . famotidine (PEPCID) 20 MG tablet Take 20 mg by mouth 2 (two) times daily.  . furosemide (LASIX) 20 MG tablet Take 20 mg by mouth daily.  Marland Kitchen gabapentin (NEURONTIN) 400 MG capsule Take 400 mg by mouth 2 (two) times daily.  Marland Kitchen ipratropium-albuterol (DUONEB) 0.5-2.5 (3) MG/3ML SOLN Take 3 mLs by nebulization 2 (two) times daily.  Marland Kitchen lactulose (CHRONULAC) 10 GM/15ML solution Take 45 mLs (30 g total) by mouth 2 (two) times daily.  Marland Kitchen lisinopril (ZESTRIL) 2.5 MG tablet Take 1 tablet (2.5 mg total) by mouth daily.  . magnesium hydroxide (MILK OF MAGNESIA) 400 MG/5ML suspension Take 30 mLs by mouth daily as needed for mild constipation.  . methocarbamol (ROBAXIN) 500 MG tablet Take 500 mg by mouth every 8 (eight) hours as needed for muscle spasms.  . mirtazapine (REMERON) 15 MG tablet Take 15 mg by mouth at bedtime.  . mometasone-formoterol (DULERA) 100-5 MCG/ACT AERO Inhale 2 puffs into the lungs daily.  . pravastatin (PRAVACHOL) 10 MG tablet Take 1 tablet (10 mg total) by mouth daily.  . traMADol (ULTRAM) 50 MG tablet Take 50 mg by mouth every 4 (four) hours.  Marland Kitchen UNABLE TO FIND Chuck pads 60/mth  . UNABLE TO FIND Depends Diapers up to 72" 120/mth  . Vitamin D, Ergocalciferol, (DRISDOL) 1.25 MG (50000 UNIT) CAPS capsule Take 1 capsule (50,000 Units total) by mouth every 7 (seven) days.  . [DISCONTINUED] lactulose (CHRONULAC) 10 GM/15ML solution Take 45 mLs (30 g total) by mouth 2 (two) times daily.  .  [DISCONTINUED] sitaGLIPtin (JANUVIA) 50 MG tablet Take 50 mg by mouth daily.    ROS:  Review of Systems  Constitutional: Negative.   HENT: Negative.   Eyes: Negative.   Respiratory: Negative.   Cardiovascular: Negative.   Gastrointestinal: Negative.   Genitourinary: Negative.   Musculoskeletal: Negative.   Skin: Negative.   Neurological: Negative.   Endo/Heme/Allergies: Negative.        Uncontrolled blood sugars secondary to prednisone use  Psychiatric/Behavioral: Negative.   All other systems reviewed and are negative.    Objective:   Today's Vitals: BP 114/60   Pulse 64   Temp 98.3 F (36.8 C) (Oral)   Resp 15   Ht _0  (1.727 m)   Wt 174 lb (78.9 kg)   SpO2 91%   BMI 26.46 kg/m  Vitals with BMI 04/17/2019 04/03/2019 03/28/2019  Height _1  _2  _3   Weight 174 lbs 165 lbs 165 lbs 8 oz  BMI 26.46 90.24 09.73  Systolic 532 - 992  Diastolic 60 -  50  Pulse 64 - 64     Physical Exam Vitals and nursing note reviewed.  Constitutional:      Appearance: Normal appearance. She is well-developed, well-groomed and overweight.  HENT:     Head: Normocephalic and atraumatic.     Right Ear: External ear normal.     Left Ear: External ear normal.     Nose: Nose normal.     Mouth/Throat:     Mouth: Mucous membranes are moist.     Pharynx: Oropharynx is clear.  Eyes:     General:        Right eye: No discharge.        Left eye: No discharge.     Conjunctiva/sclera: Conjunctivae normal.  Cardiovascular:     Rate and Rhythm: Normal rate and regular rhythm.     Pulses: Normal pulses.     Heart sounds: Normal heart sounds.  Pulmonary:     Effort: Pulmonary effort is normal.     Breath sounds: Normal breath sounds.  Musculoskeletal:        General: Normal range of motion.     Cervical back: Normal range of motion and neck supple.     Comments: In wheelchair  Skin:    General: Skin is warm.  Neurological:     General: No focal deficit present.     Mental Status:  She is alert and oriented to person, place, and time.  Psychiatric:        Attention and Perception: Attention normal.        Mood and Affect: Mood normal.        Speech: Speech normal.        Behavior: Behavior normal. Behavior is cooperative.        Thought Content: Thought content normal.        Cognition and Memory: Cognition normal.        Judgment: Judgment normal.     Assessment   1. Type 2 diabetes mellitus without complication, unspecified whether long term insulin use (HCC)   2. Other cirrhosis of liver ([place])   3. Diabetes mellitus type 2 with complications ([place])     Tests ordered Orders Placed This Encounter  Procedures  . COMPLETE METABOLIC PANEL WITH GFR     Plan: Please see assessment and plan per problem list above.   Meds ordered this encounter  Medications  . metFORMIN (GLUCOPHAGE) 500 MG tablet    Sig: Take 1 tablet (500 mg total) by mouth daily.    Dispense:  30 tablet    Refill:  1    Order Specific Question:   Supervising Provider    Answer:   SIMPSON, MARGARET E [[id number]]  . lactulose (CHRONULAC) 10 GM/15ML solution    Sig: Take 45 mLs (30 g total) by mouth 2 (two) times daily.    Dispense:  3000 mL    Refill:  0    Order Specific Question:   Supervising Provider    Answer:   Fayrene Helper [[id number]]    Patient to follow-up in 1 month  [name], NP

## 2019-04-17 NOTE — Telephone Encounter (Signed)
Pt caregiver marijo aware to start metforming tomorrow instead of today with verbal understanding

## 2019-04-17 NOTE — Telephone Encounter (Signed)
Called patient's caregiver to let her know to start metformin tomorrow 04/18/2019 per verbal clarification from provider. No answer, no vm. Will try again later.

## 2019-04-17 NOTE — Telephone Encounter (Signed)
Heather Newman called about pt said she had already taken her [place] today and wanted to know if she should start pt on metformin at dinner or if they should wait until tomorrow. Would like clarification

## 2019-04-17 NOTE — Assessment & Plan Note (Signed)
Refilled

## 2019-04-19 ENCOUNTER — Other Ambulatory Visit: Payer: Self-pay

## 2019-04-19 ENCOUNTER — Ambulatory Visit (HOSPITAL_COMMUNITY): Payer: Medicare Other | Attending: Family | Admitting: Physical Therapy

## 2019-04-19 ENCOUNTER — Encounter (HOSPITAL_COMMUNITY): Payer: Self-pay | Admitting: Physical Therapy

## 2019-04-19 DIAGNOSIS — M6281 Muscle weakness (generalized): Secondary | ICD-10-CM | POA: Diagnosis present

## 2019-04-19 DIAGNOSIS — R293 Abnormal posture: Secondary | ICD-10-CM | POA: Diagnosis present

## 2019-04-19 NOTE — Therapy (Signed)
[place], [place], [address] Phone: [phone]   Fax:  [phone]  Physical Therapy Evaluation  Patient Details  Name: Heather Newman MRN: [id number] Date of Birth: [birth year] Referring Provider (PT): Dondra Prader NP    Encounter Date: 04/19/2019  PT End of Session - 04/19/19 1614    Visit Number  1    Number of Visits  16    Date for PT Re-Evaluation  06/15/19    Authorization Type  MEDICARE PART A    Authorization Time Period  04/19/19-06/15/19    Authorization - Visit Number  1    Authorization - Number of Visits  10    PT Start Time  [id number]    PT Stop Time  1600    PT Time Calculation (min)  45 min    Equipment Utilized During Treatment  Gait belt   RW/ Wheelchair follow   Activity Tolerance  Patient tolerated treatment well;Patient limited by fatigue    Behavior During Therapy  WFL for tasks assessed/performed       Past Medical History:  Diagnosis Date  . Altered mental status, unspecified   . Alzheimer's disease, unspecified (CODE) ([place])   . Atherosclerosis of native arteries of right leg with ulceration of other part of foot ([place])   . Atherosclerotic heart disease of native coronary artery without angina pectoris   . Atrial premature depolarization   . Bilateral primary osteoarthritis of knee   . Calculus of ureter   . Chronic systolic (congestive) heart failure ([place])   . Circadian rhythm sleep disorder, advanced sleep phase type   . Cognitive communication deficit   . Constipation, unspecified   . Dementia in other diseases classified elsewhere without behavioral disturbance ([place])   . Difficulty in walking, not elsewhere classified   . Displaced spiral fracture of shaft of left femur, subsequent encounter for closed fracture with routine healing   . Emphysema, unspecified ([place])   . Erythematous condition, unspecified   . GERD (gastroesophageal reflux disease)   . Hyperlipidemia   .  Hypertension   . Immobility syndrome (paraplegic)   . Insomnia due to medical condition   . Left bundle-branch block, unspecified   . Localized edema   . Major depressive disorder, single episode, unspecified   . Melanocytic nevi, unspecified   . Metabolic encephalopathy   . Nicotine dependence, unspecified, uncomplicated   . Obstructive sleep apnea (adult) (pediatric)   . Other cirrhosis of liver ([place])   . Other disorders of peripheral nervous system   . Other fatigue   . Other hypersomnia   . Other symptoms and signs concerning food and fluid intake   . Other symptoms and signs involving the musculoskeletal system   . Pain, unspecified   . Sleep disorder   . Snoring   . Stereotyped movement disorders   . Unspecified atrial fibrillation ([place])   . Vitamin D deficiency, unspecified     History reviewed. No pertinent surgical history.  There were no vitals filed for this visit.   Subjective Assessment - 04/19/19 1529    Subjective  Patient presents to therapy with complaint of weakness. Patient presents to evaluation with a family friend. Patients friend says patient broke her hip in November of 2019, she says she went to a SNF where she spent 9 months. Also reports femur fracture in early 2020 from a fall. She says she did not have therapy in SNF and became very weak. She says she  and patient granddaughter picked patient up in SNF in [place], and brought her here where they live. Patient is able to stand and take few steps with RW, is able to transfer with some assistance from bed to chair and chair to bed. Patient denies presence of pain. Says she is wanting to build strength and get back to walking.    Patient is accompained by:  Family member   "family friend" of patients granddaugher   Limitations  Standing;Walking;House hold activities    How long can you stand comfortably?  1-2 minutes    How long can you walk comfortably?  8-10 feet with assistance    Patient Stated Goals  Be  able to walk with a walker    Currently in Pain?  No/denies         [facility] PT Assessment - 04/19/19 0001      Assessment   Medical Diagnosis  generalized weakness    Referring Provider (PT)  Dondra Prader NP     Onset Date/Surgical Date  --   Chronic    Prior Therapy  Initially for hip 1-2 years ago       Precautions   Precautions  Fall      Restrictions   Weight Bearing Restrictions  No      Balance Screen   Has the patient fallen in the past 6 months  Yes    How many times?  3-4    Has the patient had a decrease in activity level because of a fear of falling?   Yes    Is the patient reluctant to leave their home because of a fear of falling?   No      Home Environment   Living Environment  Private residence    Living Arrangements  Other relatives    Type of [place] Access  Level entry    Home Layout  One level      Prior Function   Level of Independence  Needs assistance with ADLs      Cognition   Overall Cognitive Status  Within Functional Limits for tasks assessed      Sensation   Light Touch  Impaired by gross assessment   slight decreased sensation to light touch about LLE     Posture/Postural Control   Posture/Postural Control  Postural limitations    Postural Limitations  Rounded Shoulders;Forward head;Flexed trunk      ROM / Strength   AROM / PROM / Strength  Strength      Strength   Overall Strength Comments  MMT tested in seated     Strength Assessment Site  Hip;Knee;Ankle    Right/Left Hip  Right;Left    Right Hip Flexion  4/5    Right Hip Extension  4/5    Right Hip ABduction  4/5    Left Hip Flexion  3+/5    Left Hip Extension  4-/5    Left Hip ABduction  4-/5    Right/Left Knee  Right;Left    Right Knee Flexion  4+/5    Right Knee Extension  5/5    Left Knee Flexion  4/5    Left Knee Extension  4/5    Right/Left Ankle  Right;Left    Right Ankle Dorsiflexion  4+/5    Left Ankle Dorsiflexion  4+/5      Transfers   Transfers   Sit to Stand    Sit to Stand  6: Modified independent (  Device/Increase time)   requires multiple attempts and use of both hands     Ambulation/Gait   Ambulation/Gait  Yes    Ambulation Distance (Feet)  20 Feet    Assistive device  Rolling walker    Gait Pattern  Decreased step length - right;Decreased step length - left;Decreased stride length;Decreased hip/knee flexion - right;Decreased hip/knee flexion - left;Decreased dorsiflexion - right;Decreased dorsiflexion - left;Trunk flexed    Ambulation Surface  Level;Indoor    Gait velocity  decreased      Balance   Balance Assessed  --   unable to assess due to BLE weakness/ unsteadiness               Objective measurements completed on examination: See above findings.              PT Education - 04/19/19 1531    Education Details  Patient educated on evaluation, POC and HEP    Person(s) Educated  Patient    Methods  Explanation    Comprehension  Verbalized understanding       PT Short Term Goals - 04/19/19 1619      PT SHORT TERM GOAL #1   Title  Patient will be independent with initial HEP to improve functional outcomes    Time  4    Period  Weeks    Status  New    Target Date  05/18/19      PT SHORT TERM GOAL #2   Title  Patient will be able to perform stand x 5 in < 30 seconds to demonstrate improvement in functional mobility and reduced risk for falls.    Time  4    Period  Weeks    Status  New    Target Date  05/18/19      PT SHORT TERM GOAL #3   Title  Patient will report at least 25% overall improvement in subjective complaint to indicate improvement in ability to perform ADLs.    Time  4    Period  Weeks    Status  New    Target Date  05/18/19        PT Long Term Goals - 04/19/19 1621      PT LONG TERM GOAL #1   Title  Patient will report at least 70% overall improvement in subjective complaint to indicate improvement in ability to perform ADLs.    Time  8    Period  Weeks    Status   New    Target Date  06/15/19      PT LONG TERM GOAL #2   Title  Patient will be able to perform stand x 5 in < 20 seconds to demonstrate improvement in functional mobility and reduced risk for falls.    Time  8    Period  Weeks    Status  New    Target Date  06/15/19      PT LONG TERM GOAL #3   Title  Patient will be able to ambulate at least 150 feet during 2MWT with LRAD and improved upright posturing to demonstrate improved ability to perform functional mobility and associated tasks.    Time  8    Period  Weeks    Status  New    Target Date  06/15/19             Plan - 04/19/19 1615    Clinical Impression Statement  Patient is a 79 y.o. female who presents to  physical therapy with complaint of generalized weakness. Patient demonstrates decreased strength, reduced tolerance to functional activity, balance deficits and gait abnormalities which are negatively impacting patient ability to perform ADLs and functional mobility tasks. Patient will benefit from skilled physical therapy services to address these deficits to improve level of function with ADLs, functional mobility tasks, and reduce risk for falls.    Personal Factors and Comorbidities  Age;Time since onset of injury/illness/exacerbation    Examination-Activity Limitations  Bathing;Bed Mobility;Bend;Squat;Carry;Stand;Toileting;Dressing;Transfers;Hygiene/Grooming;Lift;Locomotion Level    Examination-Participation Restrictions  Community Activity    Stability/Clinical Decision Making  Stable/Uncomplicated    Clinical Decision Making  Low    Rehab Potential  Good    PT Frequency  2x / week    PT Duration  8 weeks    PT Treatment/Interventions  ADLs/Self Care Home Management;Balance training;DME Instruction;Gait training;Neuromuscular re-education;Manual techniques;Functional mobility training;Stair training;Therapeutic activities;Therapeutic exercise;Patient/family education;Passive range of motion;Energy conservation;Joint  Manipulations;Taping    PT Next Visit Plan  Review goals and HEP. Progress BLE strengthening and gait/ balance training as tolerated. Add seated ball squeeze, HS curls, sit/ stands, NBOS balance    PT Home Exercise Plan  04/19/19: seated march, heel/ toe raises, LAQs    Consulted and Agree with Plan of Care  Patient       Patient will benefit from skilled therapeutic intervention in order to improve the following deficits and impairments:  Abnormal gait, Decreased endurance, Hypomobility, Decreased activity tolerance, Decreased strength, Decreased balance, Decreased mobility, Difficulty walking, Postural dysfunction, Improper body mechanics  Visit Diagnosis: Muscle weakness (generalized)  Abnormal posture     Problem List Patient Active Problem List   Diagnosis Date Noted  . Pressure injury of skin of left buttock 04/03/2019  . Pressure injury of heel, stage 2 ([place]) 04/03/2019  . Diabetes mellitus type 2 with complications ([place]) [id number]  . Nail overgrowth 03/29/2019  . Pressure injury of left heel, stage 2 ([place]) 03/29/2019  . Obstructive sleep apnea (adult) (pediatric)   . Altered mental status, unspecified   . Atherosclerosis of native arteries of right leg with ulceration of other part of foot ([place])   . Atrial premature depolarization   . Calculus of ureter   . Chronic systolic (congestive) heart failure ([place])   . Cognitive communication deficit   . Dementia in other diseases classified elsewhere without behavioral disturbance ([place])   . Emphysema, unspecified ([place])   . Immobility syndrome (paraplegic)   . Left bundle-branch block, unspecified   . Major depressive disorder, single episode, unspecified   . Metabolic encephalopathy   . Other disorders of peripheral nervous system   . Other hypersomnia   . Other symptoms and signs concerning food and fluid intake   . Pain, unspecified   . Sleep disorder   . Stereotyped movement disorders   . Unspecified atrial fibrillation  ([place])   . Alzheimer's disease, unspecified (CODE) ([place])   . Atherosclerotic heart disease of native coronary artery without angina pectoris   . Bilateral primary osteoarthritis of knee   . Circadian rhythm sleep disorder, advanced sleep phase type   . Constipation, unspecified   . Difficulty in walking, not elsewhere classified   . Insomnia due to medical condition   . Erythematous condition, unspecified   . Localized edema   . Melanocytic nevi, unspecified   . Other cirrhosis of liver ([place])   . Other fatigue   . Snoring   . Other symptoms and signs involving the musculoskeletal system   . Vitamin D deficiency, unspecified  4:26 PM, 04/19/19 Josue Hector PT DPT  Physical Therapist with [facility]  [phone] [place] [address] [place], [place], [address] Phone: [phone]   Fax:  [phone]  Name: Julitza Bradsher MRN: [id number] Date of Birth: 1940-08-03

## 2019-04-19 NOTE — Patient Instructions (Signed)
Access Code: [email or web address]  URL: [email or web address]  Date: 04/19/2019  Prepared by: Josue Hector   Exercises Seated March - 10 reps - 2 sets - 3x daily - 7x weekly Seated Long Arc Quad - 10 reps - 2 sets - 3x daily - 7x weekly Seated Heel Toe Raises - 10 reps - 2 sets - 3x daily - 7x weekly

## 2019-04-23 ENCOUNTER — Telehealth: Payer: Self-pay | Admitting: *Deleted

## 2019-04-23 ENCOUNTER — Other Ambulatory Visit: Payer: Self-pay

## 2019-04-23 MED ORDER — GABAPENTIN 400 MG PO CAPS: 400.0000 mg | ORAL_CAPSULE | Freq: Two times a day (BID) | ORAL | 2 refills | Status: DC

## 2019-04-23 MED ORDER — FUROSEMIDE 20 MG PO TABS: 20.0000 mg | ORAL_TABLET | Freq: Every day | ORAL | 2 refills | Status: DC

## 2019-04-23 NOTE — Telephone Encounter (Signed)
Medications refilled and sent to pharmacy.

## 2019-04-23 NOTE — Telephone Encounter (Signed)
Pt daughter Idalia Needle called pt needs gabapentin and furosemide sent to [facility] in [place] also the inhaler that was called in needs a prior authroization

## 2019-04-24 ENCOUNTER — Ambulatory Visit (HOSPITAL_COMMUNITY): Payer: Medicare Other

## 2019-04-24 ENCOUNTER — Other Ambulatory Visit: Payer: Self-pay

## 2019-04-24 ENCOUNTER — Encounter (HOSPITAL_COMMUNITY): Payer: Self-pay

## 2019-04-24 DIAGNOSIS — M6281 Muscle weakness (generalized): Secondary | ICD-10-CM | POA: Diagnosis not present

## 2019-04-24 DIAGNOSIS — R293 Abnormal posture: Secondary | ICD-10-CM

## 2019-04-24 NOTE — Therapy (Signed)
[place], [place], [address] Phone: [phone]   Fax:  [phone]  Physical Therapy Treatment  Patient Details  Name: Heather Newman MRN: [id number] Date of Birth: [birth year] Referring Provider (PT): Dondra Prader NP    Encounter Date: 04/24/2019  PT End of Session - 04/24/19 1327    Visit Number  2    Number of Visits  16    Date for PT Re-Evaluation  06/15/19    Authorization Type  MEDICARE PART A    Authorization Time Period  04/19/19-06/15/19    Authorization - Visit Number  2    Authorization - Number of Visits  10    PT Start Time  1318    PT Stop Time  1358    PT Time Calculation (min)  40 min    Equipment Utilized During Treatment  Gait belt   RW/ WC follow   Activity Tolerance  Patient tolerated treatment well;Patient limited by fatigue    Behavior During Therapy  [facility] for tasks assessed/performed       Past Medical History:  Diagnosis Date  . Altered mental status, unspecified   . Alzheimer's disease, unspecified (CODE) ([place])   . Atherosclerosis of native arteries of right leg with ulceration of other part of foot ([place])   . Atherosclerotic heart disease of native coronary artery without angina pectoris   . Atrial premature depolarization   . Bilateral primary osteoarthritis of knee   . Calculus of ureter   . Chronic systolic (congestive) heart failure ([place])   . Circadian rhythm sleep disorder, advanced sleep phase type   . Cognitive communication deficit   . Constipation, unspecified   . Dementia in other diseases classified elsewhere without behavioral disturbance ([place])   . Difficulty in walking, not elsewhere classified   . Displaced spiral fracture of shaft of left femur, subsequent encounter for closed fracture with routine healing   . Emphysema, unspecified ([place])   . Erythematous condition, unspecified   . GERD (gastroesophageal reflux disease)   . Hyperlipidemia   . Hypertension   .  Immobility syndrome (paraplegic)   . Insomnia due to medical condition   . Left bundle-branch block, unspecified   . Localized edema   . Major depressive disorder, single episode, unspecified   . Melanocytic nevi, unspecified   . Metabolic encephalopathy   . Nicotine dependence, unspecified, uncomplicated   . Obstructive sleep apnea (adult) (pediatric)   . Other cirrhosis of liver ([place])   . Other disorders of peripheral nervous system   . Other fatigue   . Other hypersomnia   . Other symptoms and signs concerning food and fluid intake   . Other symptoms and signs involving the musculoskeletal system   . Pain, unspecified   . Sleep disorder   . Snoring   . Stereotyped movement disorders   . Unspecified atrial fibrillation ([place])   . Vitamin D deficiency, unspecified     History reviewed. No pertinent surgical history.  There were no vitals filed for this visit.  Subjective Assessment - 04/24/19 1322    Subjective  Pt arrived with granddaugther's friend for today treatment.  Pt reports she is very achey today    Patient Stated Goals  Be able to walk with a walker    Currently in Pain?  Yes    Pain Score  7     Pain Location  Hip    Pain Orientation  Left;Lateral    Pain Descriptors /  Indicators  Aching    Pain Type  Chronic pain    Pain Radiating Towards  lateral just past Lt knee    Pain Onset  More than a month ago    Pain Frequency  Intermittent    Aggravating Factors   weather    Pain Relieving Factors  pillow behind back for LBP and hip, [place]         [facility] PT Assessment - 04/24/19 0001      Assessment   Medical Diagnosis  generalized weakness    Referring Provider (PT)  Dondra Prader NP     Onset Date/Surgical Date  --   chronic                  [facility] Adult PT Treatment/Exercise - 04/24/19 0001      Posture/Postural Control   Posture/Postural Control  Postural limitations    Postural Limitations  Rounded Shoulders;Forward head;Flexed trunk       Exercises   Exercises  Knee/Hip      Knee/Hip Exercises: Standing   Gait Training  RW x9ft    Other Standing Knee Exercises  NBOS 2x 20"    Other Standing Knee Exercises  attempted sidestep, too fatigued      Knee/Hip Exercises: Seated   Long Arc Quad  Both;10 reps    Ball Squeeze  10x 5"    Other Seated Knee/Hip Exercises  heel/toe raises 10x    Marching  Both;10 reps    Marching Limitations  alternating, hold for 3" slow    Hamstring Curl  10 reps;Both    Hamstring Limitations  RTB    Sit to Sand  5 reps;with UE support   cueing for handplacement prior standing            PT Education - 04/24/19 1327    Education Details  Reviewed goals and assured compliance with HEP.    Person(s) Educated  Patient;Other (comment)   Granddaughter's friend   Methods  Explanation    Comprehension  Verbalized understanding       PT Short Term Goals - 04/19/19 1619      PT SHORT TERM GOAL #1   Title  Patient will be independent with initial HEP to improve functional outcomes    Time  4    Period  Weeks    Status  New    Target Date  05/18/19      PT SHORT TERM GOAL #2   Title  Patient will be able to perform stand x 5 in < 30 seconds to demonstrate improvement in functional mobility and reduced risk for falls.    Time  4    Period  Weeks    Status  New    Target Date  05/18/19      PT SHORT TERM GOAL #3   Title  Patient will report at least 25% overall improvement in subjective complaint to indicate improvement in ability to perform ADLs.    Time  4    Period  Weeks    Status  New    Target Date  05/18/19        PT Long Term Goals - 04/19/19 1621      PT LONG TERM GOAL #1   Title  Patient will report at least 70% overall improvement in subjective complaint to indicate improvement in ability to perform ADLs.    Time  8    Period  Weeks    Status  New  Target Date  06/15/19      PT LONG TERM GOAL #2   Title  Patient will be able to perform stand x 5 in < 20  seconds to demonstrate improvement in functional mobility and reduced risk for falls.    Time  8    Period  Weeks    Status  New    Target Date  06/15/19      PT LONG TERM GOAL #3   Title  Patient will be able to ambulate at least 150 feet during 2MWT with LRAD and improved upright posturing to demonstrate improved ability to perform functional mobility and associated tasks.    Time  8    Period  Weeks    Status  New    Target Date  06/15/19            Plan - 04/24/19 1513    Clinical Impression Statement  Pt arrived with granddaugther's friend who is caregiver.  Began session by reviewing goals and educated importance of compliance with HEP.  Reviewed mechanics, pt able to verbalize and demonstrate appropriate mechanics with current HEP.  Session focus on LE strengthening mainly in open chain position.  STS complete with supervision and cueing for safety with sit to stand.  Min A with gait and static balance training.  Pt limited by fatigue and SOB with increased rest breaks required, no reports of pain through session.    Personal Factors and Comorbidities  Age;Time since onset of injury/illness/exacerbation    Examination-Activity Limitations  Bathing;Bed Mobility;Bend;Squat;Carry;Stand;Toileting;Dressing;Transfers;Hygiene/Grooming;Lift;Locomotion Level    Examination-Participation Restrictions  Community Activity    Stability/Clinical Decision Making  Stable/Uncomplicated    Clinical Decision Making  Low    Rehab Potential  Good    PT Frequency  2x / week    PT Duration  8 weeks    PT Treatment/Interventions  ADLs/Self Care Home Management;Balance training;DME Instruction;Gait training;Neuromuscular re-education;Manual techniques;Functional mobility training;Stair training;Therapeutic activities;Therapeutic exercise;Patient/family education;Passive range of motion;Energy conservation;Joint Manipulations;Taping    PT Next Visit Plan  Progress BLE strengthening and gait/ balance  training as tolerated.  Continue STS and NBOS balance.  Add sidestep next session with HHA    PT Home Exercise Plan  04/19/19: seated march, heel/ toe raises, LAQs       Patient will benefit from skilled therapeutic intervention in order to improve the following deficits and impairments:  Abnormal gait, Decreased endurance, Hypomobility, Decreased activity tolerance, Decreased strength, Decreased balance, Decreased mobility, Difficulty walking, Postural dysfunction, Improper body mechanics  Visit Diagnosis: Muscle weakness (generalized)  Abnormal posture     Problem List Patient Active Problem List   Diagnosis Date Noted  . Pressure injury of skin of left buttock 04/03/2019  . Pressure injury of heel, stage 2 ([place]) 04/03/2019  . Diabetes mellitus type 2 with complications ([place]) [id number]  . Nail overgrowth 03/29/2019  . Pressure injury of left heel, stage 2 ([place]) 03/29/2019  . Obstructive sleep apnea (adult) (pediatric)   . Altered mental status, unspecified   . Atherosclerosis of native arteries of right leg with ulceration of other part of foot ([place])   . Atrial premature depolarization   . Calculus of ureter   . Chronic systolic (congestive) heart failure ([place])   . Cognitive communication deficit   . Dementia in other diseases classified elsewhere without behavioral disturbance ([place])   . Emphysema, unspecified ([place])   . Immobility syndrome (paraplegic)   . Left bundle-branch block, unspecified   . Major depressive disorder, single episode, unspecified   .  Metabolic encephalopathy   . Other disorders of peripheral nervous system   . Other hypersomnia   . Other symptoms and signs concerning food and fluid intake   . Pain, unspecified   . Sleep disorder   . Stereotyped movement disorders   . Unspecified atrial fibrillation ([place])   . Alzheimer's disease, unspecified (CODE) ([place])   . Atherosclerotic heart disease of native coronary artery without angina pectoris   .  Bilateral primary osteoarthritis of knee   . Circadian rhythm sleep disorder, advanced sleep phase type   . Constipation, unspecified   . Difficulty in walking, not elsewhere classified   . Insomnia due to medical condition   . Erythematous condition, unspecified   . Localized edema   . Melanocytic nevi, unspecified   . Other cirrhosis of liver ([place])   . Other fatigue   . Snoring   . Other symptoms and signs involving the musculoskeletal system   . Vitamin D deficiency, unspecified    [place]hor [place], [place]; [place]  Aldona Lento 04/24/2019, 5:38 PM  [facility] [address] [place], [place], [address] Phone: [phone]   Fax:  [phone]  Name: Heather Newman MRN: [id number] Date of Birth: 08-Jun-1940

## 2019-04-26 ENCOUNTER — Other Ambulatory Visit: Payer: Self-pay

## 2019-04-26 ENCOUNTER — Encounter (HOSPITAL_COMMUNITY): Payer: Self-pay

## 2019-04-26 ENCOUNTER — Ambulatory Visit (HOSPITAL_COMMUNITY): Payer: Medicare Other

## 2019-04-26 DIAGNOSIS — M6281 Muscle weakness (generalized): Secondary | ICD-10-CM

## 2019-04-26 DIAGNOSIS — R293 Abnormal posture: Secondary | ICD-10-CM

## 2019-04-26 NOTE — Therapy (Signed)
[place], [place], [address] Phone: [phone]   Fax:  [phone]  Physical Therapy Treatment  Patient Details  Name: Heather Newman MRN: [id number] Date of Birth: 1941-03-18 Referring Provider (PT): Dondra Prader NP    Encounter Date: 04/26/2019  PT End of Session - 04/26/19 1426    Visit Number  3    Number of Visits  16    Date for PT Re-Evaluation  06/15/19    Authorization Type  MEDICARE PART A    Authorization Time Period  04/19/19-06/15/19    Authorization - Visit Number  3    Authorization - Number of Visits  10    PT Start Time  1410   pt late for apt   PT Stop Time  1443    PT Time Calculation (min)  33 min    Equipment Utilized During Treatment  Gait belt   Gait belt, RW, WC   Activity Tolerance  Patient limited by pain;Patient limited by fatigue;Patient tolerated treatment well    Behavior During Therapy  [facility] for tasks assessed/performed       Past Medical History:  Diagnosis Date  . Altered mental status, unspecified   . Alzheimer's disease, unspecified (CODE) ([place])   . Atherosclerosis of native arteries of right leg with ulceration of other part of foot ([place])   . Atherosclerotic heart disease of native coronary artery without angina pectoris   . Atrial premature depolarization   . Bilateral primary osteoarthritis of knee   . Calculus of ureter   . Chronic systolic (congestive) heart failure ([place])   . Circadian rhythm sleep disorder, advanced sleep phase type   . Cognitive communication deficit   . Constipation, unspecified   . Dementia in other diseases classified elsewhere without behavioral disturbance ([place])   . Difficulty in walking, not elsewhere classified   . Displaced spiral fracture of shaft of left femur, subsequent encounter for closed fracture with routine healing   . Emphysema, unspecified ([place])   . Erythematous condition, unspecified   . GERD (gastroesophageal reflux  disease)   . Hyperlipidemia   . Hypertension   . Immobility syndrome (paraplegic)   . Insomnia due to medical condition   . Left bundle-branch block, unspecified   . Localized edema   . Major depressive disorder, single episode, unspecified   . Melanocytic nevi, unspecified   . Metabolic encephalopathy   . Nicotine dependence, unspecified, uncomplicated   . Obstructive sleep apnea (adult) (pediatric)   . Other cirrhosis of liver ([place])   . Other disorders of peripheral nervous system   . Other fatigue   . Other hypersomnia   . Other symptoms and signs concerning food and fluid intake   . Other symptoms and signs involving the musculoskeletal system   . Pain, unspecified   . Sleep disorder   . Snoring   . Stereotyped movement disorders   . Unspecified atrial fibrillation ([place])   . Vitamin D deficiency, unspecified     History reviewed. No pertinent surgical history.  There were no vitals filed for this visit.  Subjective Assessment - 04/26/19 1424    Subjective  Pt arrived with granddaughter's friend, care giver today.  Pt reports very sore today and edema present Bil LE.    Patient is accompained by:  Family member    Patient Stated Goals  Be able to walk with a walker    Currently in Pain?  Yes    Pain Score  8     Pain Location  Leg    Pain Orientation  Left;Lateral    Pain Descriptors / Indicators  Sore    Pain Type  Chronic pain    Pain Onset  More than a month ago    Pain Frequency  Intermittent    Aggravating Factors   weather    Pain Relieving Factors  pillow behind back for LBP and hip, [place]                       [facility] Adult PT Treatment/Exercise - 04/26/19 0001      Ambulation/Gait   Ambulation/Gait  Yes    Ambulation Distance (Feet)  8 Feet    Assistive device  Rolling walker    Gait Pattern  Decreased step length - right;Decreased step length - left;Decreased stride length;Decreased hip/knee flexion - right;Decreased hip/knee flexion -  left;Decreased dorsiflexion - right;Decreased dorsiflexion - left;Trunk flexed    Ambulation Surface  Level;Indoor    Gait velocity  decreased      Posture/Postural Control   Posture/Postural Control  Postural limitations    Postural Limitations  Rounded Shoulders;Forward head;Flexed trunk      Knee/Hip Exercises: Standing   Gait Training  RW x66ft      Knee/Hip Exercises: Seated   Long Arc Quad  Both;10 reps    Other Seated Knee/Hip Exercises  heel/toe raises 10x    Marching  Both;10 reps    Marching Limitations  alternating, hold for 3" slow    Sit to Sand  1 set      Knee/Hip Exercises: Supine   Heel Slides  Both;5 reps    Other Supine Knee/Hip Exercises  gluteal sets 5x 5"      Manual Therapy   Manual Therapy  Soft tissue mobilization    Manual therapy comments  Manual complete separate than rest of tx with use of rolling stick    Soft tissue mobilization  Rt femor/hip with IASTM             PT Education - 04/26/19 1427    Education Details  Self care massage techniques wiht rolling stick, ball or massage machine at home to assist with pain control.  Open chain exercises only due to pain.    Person(s) Educated  Patient;Other (comment)   Granddaughter's friend- caregiver   Methods  Explanation;Demonstration    Comprehension  Verbalized understanding;Verbal cues required;Need further instruction       PT Short Term Goals - 04/19/19 1619      PT SHORT TERM GOAL #1   Title  Patient will be independent with initial HEP to improve functional outcomes    Time  4    Period  Weeks    Status  New    Target Date  05/18/19      PT SHORT TERM GOAL #2   Title  Patient will be able to perform stand x 5 in < 30 seconds to demonstrate improvement in functional mobility and reduced risk for falls.    Time  4    Period  Weeks    Status  New    Target Date  05/18/19      PT SHORT TERM GOAL #3   Title  Patient will report at least 25% overall improvement in subjective  complaint to indicate improvement in ability to perform ADLs.    Time  4    Period  Weeks    Status  New  Target Date  05/18/19        PT Long Term Goals - 04/19/19 1621      PT LONG TERM GOAL #1   Title  Patient will report at least 70% overall improvement in subjective complaint to indicate improvement in ability to perform ADLs.    Time  8    Period  Weeks    Status  New    Target Date  06/15/19      PT LONG TERM GOAL #2   Title  Patient will be able to perform stand x 5 in < 20 seconds to demonstrate improvement in functional mobility and reduced risk for falls.    Time  8    Period  Weeks    Status  New    Target Date  06/15/19      PT LONG TERM GOAL #3   Title  Patient will be able to ambulate at least 150 feet during 2MWT with LRAD and improved upright posturing to demonstrate improved ability to perform functional mobility and associated tasks.    Time  8    Period  Weeks    Status  New    Target Date  06/15/19            Plan - 04/26/19 1430    Clinical Impression Statement  Pt limited by pain following increased activities last session.  Began session with manual soft tissue mobilizaiton to address tight restricitons around Rt hip/femor for pain relief.  Pt educated on self care technqiues with use of rolling stick/ball at home for pain control.  Therex focus mainly open chair positions for pain control, min A and gait belt/RW used to transfer to bed from [facility] as well as short distance gait training at [facility].  Pt limited by fatigue and SOB through session, limited by pain as well though does report some relief following manual at [facility].    Personal Factors and Comorbidities  Age;Time since onset of injury/illness/exacerbation    Examination-Activity Limitations  Bathing;Bed Mobility;Bend;Squat;Carry;Stand;Toileting;Dressing;Transfers;Hygiene/Grooming;Lift;Locomotion Level    Examination-Participation Restrictions  Community Activity    Stability/Clinical Decision  Making  Stable/Uncomplicated    Clinical Decision Making  Low    Rehab Potential  Good    PT Frequency  2x / week    PT Duration  8 weeks    PT Treatment/Interventions  ADLs/Self Care Home Management;Balance training;DME Instruction;Gait training;Neuromuscular re-education;Manual techniques;Functional mobility training;Stair training;Therapeutic activities;Therapeutic exercise;Patient/family education;Passive range of motion;Energy conservation;Joint Manipulations;Taping    PT Next Visit Plan  F/U wiht pain and manual as needed.  When able add RTB with seated abduction.  Progress BLE strengthening and gait/ balance training as tolerated.  Continue STS and NBOS balance.  Add sidestep next session with HHA    PT Home Exercise Plan  04/19/19: seated march, heel/ toe raises, LAQs       Patient will benefit from skilled therapeutic intervention in order to improve the following deficits and impairments:  Abnormal gait, Decreased endurance, Hypomobility, Decreased activity tolerance, Decreased strength, Decreased balance, Decreased mobility, Difficulty walking, Postural dysfunction, Improper body mechanics  Visit Diagnosis: Muscle weakness (generalized)  Abnormal posture     Problem List Patient Active Problem List   Diagnosis Date Noted  . Pressure injury of skin of left buttock 04/03/2019  . Pressure injury of heel, stage 2 ([place]) 04/03/2019  . Diabetes mellitus type 2 with complications ([place]) 99991111  . Nail overgrowth 03/29/2019  . Pressure injury of left heel, stage 2 ([place]) 03/29/2019  .  Obstructive sleep apnea (adult) (pediatric)   . Altered mental status, unspecified   . Atherosclerosis of native arteries of right leg with ulceration of other part of foot ([place])   . Atrial premature depolarization   . Calculus of ureter   . Chronic systolic (congestive) heart failure ([place])   . Cognitive communication deficit   . Dementia in other diseases classified elsewhere without behavioral  disturbance ([place])   . Emphysema, unspecified ([place])   . Immobility syndrome (paraplegic)   . Left bundle-branch block, unspecified   . Major depressive disorder, single episode, unspecified   . Metabolic encephalopathy   . Other disorders of peripheral nervous system   . Other hypersomnia   . Other symptoms and signs concerning food and fluid intake   . Pain, unspecified   . Sleep disorder   . Stereotyped movement disorders   . Unspecified atrial fibrillation ([place])   . Alzheimer's disease, unspecified (CODE) ([place])   . Atherosclerotic heart disease of native coronary artery without angina pectoris   . Bilateral primary osteoarthritis of knee   . Circadian rhythm sleep disorder, advanced sleep phase type   . Constipation, unspecified   . Difficulty in walking, not elsewhere classified   . Insomnia due to medical condition   . Erythematous condition, unspecified   . Localized edema   . Melanocytic nevi, unspecified   . Other cirrhosis of liver ([place])   . Other fatigue   . Snoring   . Other symptoms and signs involving the musculoskeletal system   . Vitamin D deficiency, unspecified    [place]hor [place], [place]; [place]  Aldona Lento 04/26/2019, 2:56 PM  Time [address] [place], [place], [address] Phone: [phone]   Fax:  [phone]  Name: Heather Newman MRN: [id number] Date of Birth: Jun 04, 1940

## 2019-05-01 ENCOUNTER — Encounter (HOSPITAL_COMMUNITY): Payer: Self-pay | Admitting: Physical Therapy

## 2019-05-01 ENCOUNTER — Other Ambulatory Visit: Payer: Self-pay

## 2019-05-01 ENCOUNTER — Ambulatory Visit (HOSPITAL_COMMUNITY): Payer: Medicare Other | Attending: Family | Admitting: Physical Therapy

## 2019-05-01 ENCOUNTER — Telehealth: Payer: Self-pay

## 2019-05-01 DIAGNOSIS — R293 Abnormal posture: Secondary | ICD-10-CM | POA: Insufficient documentation

## 2019-05-01 DIAGNOSIS — M6281 Muscle weakness (generalized): Secondary | ICD-10-CM | POA: Diagnosis not present

## 2019-05-01 MED ORDER — MIRTAZAPINE 15 MG PO TABS: 15.0000 mg | ORAL_TABLET | Freq: Every day | ORAL | 2 refills | Status: DC

## 2019-05-01 NOTE — Telephone Encounter (Signed)
Medication refilled and sent to pharmacy.

## 2019-05-01 NOTE — Telephone Encounter (Signed)
Please send refill to [facility] in , [place]

## 2019-05-01 NOTE — Therapy (Signed)
[place], [place], [address] Phone: [phone]   Fax:  [phone]  Physical Therapy Treatment  Patient Details  Name: Heather Newman MRN: [id number] Date of Birth: 03/26/1941 Referring Provider (PT): Dondra Prader NP    Encounter Date: 05/01/2019  PT End of Session - 05/01/19 1446    Visit Number  4    Number of Visits  16    Date for PT Re-Evaluation  06/15/19    Authorization Type  MEDICARE PART A    Authorization Time Period  04/19/19-06/15/19    Authorization - Visit Number  4    Authorization - Number of Visits  10    PT Start Time  [id number]    PT Stop Time  1515    PT Time Calculation (min)  39 min    Equipment Utilized During Treatment  Gait belt   Gait belt, RW, WC   Activity Tolerance  Patient limited by fatigue;Patient tolerated treatment well    Behavior During Therapy  [facility] for tasks assessed/performed       Past Medical History:  Diagnosis Date  . Altered mental status, unspecified   . Alzheimer's disease, unspecified (CODE) ([place])   . Atherosclerosis of native arteries of right leg with ulceration of other part of foot ([place])   . Atherosclerotic heart disease of native coronary artery without angina pectoris   . Atrial premature depolarization   . Bilateral primary osteoarthritis of knee   . Calculus of ureter   . Chronic systolic (congestive) heart failure ([place])   . Circadian rhythm sleep disorder, advanced sleep phase type   . Cognitive communication deficit   . Constipation, unspecified   . Dementia in other diseases classified elsewhere without behavioral disturbance ([place])   . Difficulty in walking, not elsewhere classified   . Displaced spiral fracture of shaft of left femur, subsequent encounter for closed fracture with routine healing   . Emphysema, unspecified ([place])   . Erythematous condition, unspecified   . GERD (gastroesophageal reflux disease)   . Hyperlipidemia   . Hypertension    . Immobility syndrome (paraplegic)   . Insomnia due to medical condition   . Left bundle-branch block, unspecified   . Localized edema   . Major depressive disorder, single episode, unspecified   . Melanocytic nevi, unspecified   . Metabolic encephalopathy   . Nicotine dependence, unspecified, uncomplicated   . Obstructive sleep apnea (adult) (pediatric)   . Other cirrhosis of liver ([place])   . Other disorders of peripheral nervous system   . Other fatigue   . Other hypersomnia   . Other symptoms and signs concerning food and fluid intake   . Other symptoms and signs involving the musculoskeletal system   . Pain, unspecified   . Sleep disorder   . Snoring   . Stereotyped movement disorders   . Unspecified atrial fibrillation ([place])   . Vitamin D deficiency, unspecified     History reviewed. No pertinent surgical history.  There were no vitals filed for this visit.  Subjective Assessment - 05/01/19 1443    Subjective  Patient says her legs are very sore, says she was having a lot of pain earlier today but her granddaughter gave her a message and she feels much better now. Says the cold makes her hurt more. Denies pain currently.    Patient is accompained by:  Family member    Patient Stated Goals  Be able to walk with a walker  Currently in Pain?  No/denies    Pain Onset  More than a month ago                       [facility] Adult PT Treatment/Exercise - 05/01/19 0001      Knee/Hip Exercises: Stretches   Other Knee/Hip Stretches  seated knee flexion stretch 5x5" each       Knee/Hip Exercises: Standing   Gait Training  16 feet with RW, 15 feet with RW and WC follow      Knee/Hip Exercises: Seated   Sit to Sand  5 reps;with UE support      Knee/Hip Exercises: Supine   Heel Slides  Both;10 reps    Other Supine Knee/Hip Exercises  gluteal sets 10 x 5"      Manual Therapy   Manual Therapy  Soft tissue mobilization    Manual therapy comments  Manual complete  separate than rest of tx     Soft tissue mobilization  roller stick to LT quad and ITB in seated position               PT Short Term Goals - 04/19/19 1619      PT SHORT TERM GOAL #1   Title  Patient will be independent with initial HEP to improve functional outcomes    Time  4    Period  Weeks    Status  New    Target Date  05/18/19      PT SHORT TERM GOAL #2   Title  Patient will be able to perform stand x 5 in < 30 seconds to demonstrate improvement in functional mobility and reduced risk for falls.    Time  4    Period  Weeks    Status  New    Target Date  05/18/19      PT SHORT TERM GOAL #3   Title  Patient will report at least 25% overall improvement in subjective complaint to indicate improvement in ability to perform ADLs.    Time  4    Period  Weeks    Status  New    Target Date  05/18/19        PT Long Term Goals - 04/19/19 1621      PT LONG TERM GOAL #1   Title  Patient will report at least 70% overall improvement in subjective complaint to indicate improvement in ability to perform ADLs.    Time  8    Period  Weeks    Status  New    Target Date  06/15/19      PT LONG TERM GOAL #2   Title  Patient will be able to perform stand x 5 in < 20 seconds to demonstrate improvement in functional mobility and reduced risk for falls.    Time  8    Period  Weeks    Status  New    Target Date  06/15/19      PT LONG TERM GOAL #3   Title  Patient will be able to ambulate at least 150 feet during 2MWT with LRAD and improved upright posturing to demonstrate improved ability to perform functional mobility and associated tasks.    Time  8    Period  Weeks    Status  New    Target Date  06/15/19            Plan - 05/01/19 1550    Clinical Impression Statement  Patient had some trouble getting into position for supine exercise. Required assistance with LES. Patient requires wedge and several pillows for keeping head elevated to avoid breathing issues due to  patient hx of emphysema. Patient repositioned to seated at EOB for comfort during manual treatment.  Patient required tactile cues for glute activation with glute sets. Patient did well with gait training today, and was able to increase distance, but requires continued cueing for proper use of hands on RW and keeping upright posture. Patient did require rest breaks due to fatigue and increased work of breathing with activity. Patient noted some improvement in LLE stiffness post session.    Personal Factors and Comorbidities  Age;Time since onset of injury/illness/exacerbation    Examination-Activity Limitations  Bathing;Bed Mobility;Bend;Squat;Carry;Stand;Toileting;Dressing;Transfers;Hygiene/Grooming;Lift;Locomotion Level    Examination-Participation Restrictions  Community Activity    Stability/Clinical Decision Making  Stable/Uncomplicated    Rehab Potential  Good    PT Frequency  2x / week    PT Duration  8 weeks    PT Treatment/Interventions  ADLs/Self Care Home Management;Balance training;DME Instruction;Gait training;Neuromuscular re-education;Manual techniques;Functional mobility training;Stair training;Therapeutic activities;Therapeutic exercise;Patient/family education;Passive range of motion;Energy conservation;Joint Manipulations;Taping    PT Next Visit Plan  F/U wiht pain and manual as needed.  When able add RTB with seated abduction.  Progress BLE strengthening and gait/ balance training as tolerated.  Continue STS and NBOS balance.  Add sidestep next session with HHA    PT Home Exercise Plan  04/19/19: seated march, heel/ toe raises, LAQs       Patient will benefit from skilled therapeutic intervention in order to improve the following deficits and impairments:  Abnormal gait, Decreased endurance, Hypomobility, Decreased activity tolerance, Decreased strength, Decreased balance, Decreased mobility, Difficulty walking, Postural dysfunction, Improper body mechanics  Visit  Diagnosis: Muscle weakness (generalized)  Abnormal posture     Problem List Patient Active Problem List   Diagnosis Date Noted  . Pressure injury of skin of left buttock 04/03/2019  . Pressure injury of heel, stage 2 ([place]) 04/03/2019  . Diabetes mellitus type 2 with complications ([place]) 99991111  . Nail overgrowth 03/29/2019  . Pressure injury of left heel, stage 2 ([place]) 03/29/2019  . Obstructive sleep apnea (adult) (pediatric)   . Altered mental status, unspecified   . Atherosclerosis of native arteries of right leg with ulceration of other part of foot ([place])   . Atrial premature depolarization   . Calculus of ureter   . Chronic systolic (congestive) heart failure ([place])   . Cognitive communication deficit   . Dementia in other diseases classified elsewhere without behavioral disturbance ([place])   . Emphysema, unspecified ([place])   . Immobility syndrome (paraplegic)   . Left bundle-branch block, unspecified   . Major depressive disorder, single episode, unspecified   . Metabolic encephalopathy   . Other disorders of peripheral nervous system   . Other hypersomnia   . Other symptoms and signs concerning food and fluid intake   . Pain, unspecified   . Sleep disorder   . Stereotyped movement disorders   . Unspecified atrial fibrillation ([place])   . Alzheimer's disease, unspecified (CODE) ([place])   . Atherosclerotic heart disease of native coronary artery without angina pectoris   . Bilateral primary osteoarthritis of knee   . Circadian rhythm sleep disorder, advanced sleep phase type   . Constipation, unspecified   . Difficulty in walking, not elsewhere classified   . Insomnia due to medical condition   . Erythematous condition, unspecified   . Localized edema   .  Melanocytic nevi, unspecified   . Other cirrhosis of liver ([place])   . Other fatigue   . Snoring   . Other symptoms and signs involving the musculoskeletal system   . Vitamin D deficiency, unspecified    3:54 PM,  05/01/19 Josue Hector PT DPT  Physical Therapist with [facility]  [phone] [place] [address] [place], [place], [address] Phone: [phone]   Fax:  [phone]  Name: Heather Newman MRN: [id number] Date of Birth: 08/30/1940

## 2019-05-02 ENCOUNTER — Telehealth (HOSPITAL_COMMUNITY): Payer: Self-pay | Admitting: Physical Therapy

## 2019-05-02 NOTE — Telephone Encounter (Signed)
Pt's caregiver called states her leg is too swollen and they will be here next week.

## 2019-05-03 ENCOUNTER — Ambulatory Visit (HOSPITAL_COMMUNITY): Payer: Medicare Other | Admitting: Physical Therapy

## 2019-05-07 ENCOUNTER — Telehealth (HOSPITAL_COMMUNITY): Payer: Self-pay | Admitting: Physical Therapy

## 2019-05-07 ENCOUNTER — Other Ambulatory Visit: Payer: Self-pay

## 2019-05-07 ENCOUNTER — Telehealth: Payer: Self-pay

## 2019-05-07 MED ORDER — DILTIAZEM HCL ER COATED BEADS 120 MG PO CP24: 120.0000 mg | ORAL_CAPSULE | Freq: Every day | ORAL | 0 refills | Status: DC

## 2019-05-07 NOTE — Telephone Encounter (Signed)
Please send Diltiazem to [facility] in [place]

## 2019-05-07 NOTE — Telephone Encounter (Signed)
Heather Newman has never refilled before. Ok to refill?

## 2019-05-07 NOTE — Telephone Encounter (Signed)
pt's caregiver called to cancel this appt due to she is hurting and stiff

## 2019-05-07 NOTE — Telephone Encounter (Signed)
Medication refilled

## 2019-05-08 ENCOUNTER — Ambulatory Visit (HOSPITAL_COMMUNITY): Payer: Medicare Other | Admitting: Physical Therapy

## 2019-05-10 ENCOUNTER — Telehealth (HOSPITAL_COMMUNITY): Payer: Self-pay | Admitting: Physical Therapy

## 2019-05-10 ENCOUNTER — Telehealth (HOSPITAL_COMMUNITY): Payer: Self-pay

## 2019-05-10 ENCOUNTER — Ambulatory Visit (HOSPITAL_COMMUNITY): Payer: Medicare Other

## 2019-05-10 NOTE — Telephone Encounter (Signed)
pt's caregiver called to cancel today's appt and mondays due to she still has some swelling in her legs

## 2019-05-12 ENCOUNTER — Encounter (HOSPITAL_COMMUNITY): Payer: Self-pay | Admitting: Emergency Medicine

## 2019-05-12 ENCOUNTER — Emergency Department (HOSPITAL_COMMUNITY): Payer: Medicare Other

## 2019-05-12 ENCOUNTER — Other Ambulatory Visit: Payer: Self-pay

## 2019-05-12 ENCOUNTER — Inpatient Hospital Stay (HOSPITAL_COMMUNITY)
Admission: EM | Admit: 2019-05-12 | Discharge: 2019-05-16 | DRG: 190 | Disposition: A | Discharge status: Skilled Nursing Facility | Payer: Medicare Other | Attending: Family Medicine | Admitting: Family Medicine

## 2019-05-12 DIAGNOSIS — L8990 Pressure ulcer of unspecified site, unspecified stage: Secondary | ICD-10-CM | POA: Diagnosis present

## 2019-05-12 DIAGNOSIS — I110 Hypertensive heart disease with heart failure: Secondary | ICD-10-CM | POA: Diagnosis present

## 2019-05-12 DIAGNOSIS — I483 Typical atrial flutter: Secondary | ICD-10-CM | POA: Diagnosis not present

## 2019-05-12 DIAGNOSIS — I480 Paroxysmal atrial fibrillation: Secondary | ICD-10-CM | POA: Diagnosis present

## 2019-05-12 DIAGNOSIS — K7469 Other cirrhosis of liver: Secondary | ICD-10-CM | POA: Diagnosis present

## 2019-05-12 DIAGNOSIS — J9621 Acute and chronic respiratory failure with hypoxia: Secondary | ICD-10-CM | POA: Diagnosis present

## 2019-05-12 DIAGNOSIS — I25118 Atherosclerotic heart disease of native coronary artery with other forms of angina pectoris: Secondary | ICD-10-CM | POA: Diagnosis present

## 2019-05-12 DIAGNOSIS — Z87442 Personal history of urinary calculi: Secondary | ICD-10-CM

## 2019-05-12 DIAGNOSIS — G9341 Metabolic encephalopathy: Secondary | ICD-10-CM | POA: Diagnosis present

## 2019-05-12 DIAGNOSIS — L89152 Pressure ulcer of sacral region, stage 2: Secondary | ICD-10-CM | POA: Diagnosis present

## 2019-05-12 DIAGNOSIS — I5022 Chronic systolic (congestive) heart failure: Secondary | ICD-10-CM | POA: Diagnosis present

## 2019-05-12 DIAGNOSIS — J9602 Acute respiratory failure with hypercapnia: Secondary | ICD-10-CM | POA: Diagnosis not present

## 2019-05-12 DIAGNOSIS — M79671 Pain in right foot: Secondary | ICD-10-CM | POA: Diagnosis not present

## 2019-05-12 DIAGNOSIS — K219 Gastro-esophageal reflux disease without esophagitis: Secondary | ICD-10-CM | POA: Diagnosis present

## 2019-05-12 DIAGNOSIS — Z7984 Long term (current) use of oral hypoglycemic drugs: Secondary | ICD-10-CM

## 2019-05-12 DIAGNOSIS — Z833 Family history of diabetes mellitus: Secondary | ICD-10-CM

## 2019-05-12 DIAGNOSIS — F329 Major depressive disorder, single episode, unspecified: Secondary | ICD-10-CM | POA: Diagnosis present

## 2019-05-12 DIAGNOSIS — G309 Alzheimer's disease, unspecified: Secondary | ICD-10-CM | POA: Diagnosis present

## 2019-05-12 DIAGNOSIS — J9622 Acute and chronic respiratory failure with hypercapnia: Secondary | ICD-10-CM | POA: Diagnosis present

## 2019-05-12 DIAGNOSIS — Z20822 Contact with and (suspected) exposure to covid-19: Secondary | ICD-10-CM | POA: Diagnosis present

## 2019-05-12 DIAGNOSIS — I493 Ventricular premature depolarization: Secondary | ICD-10-CM | POA: Diagnosis present

## 2019-05-12 DIAGNOSIS — Z9049 Acquired absence of other specified parts of digestive tract: Secondary | ICD-10-CM

## 2019-05-12 DIAGNOSIS — K7290 Hepatic failure, unspecified without coma: Secondary | ICD-10-CM | POA: Diagnosis present

## 2019-05-12 DIAGNOSIS — I361 Nonrheumatic tricuspid (valve) insufficiency: Secondary | ICD-10-CM | POA: Diagnosis not present

## 2019-05-12 DIAGNOSIS — I447 Left bundle-branch block, unspecified: Secondary | ICD-10-CM | POA: Diagnosis present

## 2019-05-12 DIAGNOSIS — M170 Bilateral primary osteoarthritis of knee: Secondary | ICD-10-CM | POA: Diagnosis present

## 2019-05-12 DIAGNOSIS — M79672 Pain in left foot: Secondary | ICD-10-CM | POA: Diagnosis not present

## 2019-05-12 DIAGNOSIS — E559 Vitamin D deficiency, unspecified: Secondary | ICD-10-CM | POA: Diagnosis present

## 2019-05-12 DIAGNOSIS — J441 Chronic obstructive pulmonary disease with (acute) exacerbation: Secondary | ICD-10-CM | POA: Diagnosis present

## 2019-05-12 DIAGNOSIS — T380X5A Adverse effect of glucocorticoids and synthetic analogues, initial encounter: Secondary | ICD-10-CM | POA: Diagnosis not present

## 2019-05-12 DIAGNOSIS — E118 Type 2 diabetes mellitus with unspecified complications: Secondary | ICD-10-CM | POA: Diagnosis present

## 2019-05-12 DIAGNOSIS — J439 Emphysema, unspecified: Secondary | ICD-10-CM | POA: Diagnosis present

## 2019-05-12 DIAGNOSIS — J9601 Acute respiratory failure with hypoxia: Secondary | ICD-10-CM | POA: Diagnosis present

## 2019-05-12 DIAGNOSIS — F17210 Nicotine dependence, cigarettes, uncomplicated: Secondary | ICD-10-CM | POA: Diagnosis present

## 2019-05-12 DIAGNOSIS — Z8679 Personal history of other diseases of the circulatory system: Secondary | ICD-10-CM

## 2019-05-12 DIAGNOSIS — Z9181 History of falling: Secondary | ICD-10-CM

## 2019-05-12 DIAGNOSIS — Y92239 Unspecified place in hospital as the place of occurrence of the external cause: Secondary | ICD-10-CM | POA: Diagnosis not present

## 2019-05-12 DIAGNOSIS — I082 Rheumatic disorders of both aortic and tricuspid valves: Secondary | ICD-10-CM | POA: Diagnosis present

## 2019-05-12 DIAGNOSIS — E785 Hyperlipidemia, unspecified: Secondary | ICD-10-CM | POA: Diagnosis present

## 2019-05-12 DIAGNOSIS — G4733 Obstructive sleep apnea (adult) (pediatric): Secondary | ICD-10-CM | POA: Diagnosis present

## 2019-05-12 DIAGNOSIS — I4892 Unspecified atrial flutter: Secondary | ICD-10-CM | POA: Diagnosis present

## 2019-05-12 DIAGNOSIS — Z7982 Long term (current) use of aspirin: Secondary | ICD-10-CM

## 2019-05-12 DIAGNOSIS — Z79899 Other long term (current) drug therapy: Secondary | ICD-10-CM

## 2019-05-12 DIAGNOSIS — I340 Nonrheumatic mitral (valve) insufficiency: Secondary | ICD-10-CM | POA: Diagnosis not present

## 2019-05-12 DIAGNOSIS — I429 Cardiomyopathy, unspecified: Secondary | ICD-10-CM | POA: Diagnosis present

## 2019-05-12 DIAGNOSIS — F0280 Dementia in other diseases classified elsewhere without behavioral disturbance: Secondary | ICD-10-CM | POA: Diagnosis present

## 2019-05-12 HISTORY — DX: Paroxysmal atrial fibrillation: I48.0

## 2019-05-12 HISTORY — DX: Left bundle-branch block, unspecified: I44.7

## 2019-05-12 HISTORY — DX: Dementia in other diseases classified elsewhere, unspecified severity, without behavioral disturbance, psychotic disturbance, mood disturbance, and anxiety: F02.80

## 2019-05-12 HISTORY — DX: Obstructive sleep apnea (adult) (pediatric): G47.33

## 2019-05-12 HISTORY — DX: Essential (primary) hypertension: I10

## 2019-05-12 HISTORY — DX: Atherosclerotic heart disease of native coronary artery without angina pectoris: I25.10

## 2019-05-12 HISTORY — DX: Unspecified cirrhosis of liver: K74.60

## 2019-05-12 HISTORY — DX: Type 2 diabetes mellitus without complications: E11.9

## 2019-05-12 HISTORY — DX: Depression, unspecified: F32.A

## 2019-05-12 HISTORY — DX: Chronic obstructive pulmonary disease, unspecified: J44.9

## 2019-05-12 HISTORY — DX: Vitamin D deficiency, unspecified: E55.9

## 2019-05-12 LAB — RESPIRATORY PANEL BY RT PCR (FLU A&B, COVID)
Influenza A by PCR: NEGATIVE
Influenza B by PCR: NEGATIVE
SARS Coronavirus 2 by RT PCR: NEGATIVE

## 2019-05-12 LAB — BLOOD GAS, VENOUS
Acid-Base Excess: 12.3 mmol/L — ABNORMAL HIGH (ref 0.0–2.0)
Bicarbonate: 32.8 mmol/L — ABNORMAL HIGH (ref 20.0–28.0)
FIO2: 100
O2 Saturation: 92.9 %
Patient temperature: 37
pCO2, Ven: 93.4 mmHg (ref 44.0–60.0)
pH, Ven: 7.252 (ref 7.250–7.430)
pO2, Ven: 77 mmHg — ABNORMAL HIGH (ref 32.0–45.0)

## 2019-05-12 LAB — CBC WITH DIFFERENTIAL/PLATELET
Abs Immature Granulocytes: 0.04 10*3/uL (ref 0.00–0.07)
Basophils Absolute: 0.1 10*3/uL (ref 0.0–0.1)
Basophils Relative: 1 %
Eosinophils Absolute: 0.1 10*3/uL (ref 0.0–0.5)
Eosinophils Relative: 2 %
HCT: 43.1 % (ref 36.0–46.0)
Hemoglobin: 12.5 g/dL (ref 12.0–15.0)
Immature Granulocytes: 1 %
Lymphocytes Relative: 13 %
Lymphs Abs: 0.8 10*3/uL (ref 0.7–4.0)
MCH: 30.9 pg (ref 26.0–34.0)
MCHC: 29 g/dL — ABNORMAL LOW (ref 30.0–36.0)
MCV: 106.4 fL — ABNORMAL HIGH (ref 80.0–100.0)
Monocytes Absolute: 1 10*3/uL (ref 0.1–1.0)
Monocytes Relative: 16 %
Neutro Abs: 4 10*3/uL (ref 1.7–7.7)
Neutrophils Relative %: 67 %
Platelets: 192 10*3/uL (ref 150–400)
RBC: 4.05 MIL/uL (ref 3.87–5.11)
RDW: 18.2 % — ABNORMAL HIGH (ref 11.5–15.5)
WBC: 5.9 10*3/uL (ref 4.0–10.5)
nRBC: 0 % (ref 0.0–0.2)

## 2019-05-12 LAB — COMPREHENSIVE METABOLIC PANEL
ALT: 20 U/L (ref 0–44)
AST: 23 U/L (ref 15–41)
Albumin: 3.1 g/dL — ABNORMAL LOW (ref 3.5–5.0)
Alkaline Phosphatase: 115 U/L (ref 38–126)
Anion gap: 8 (ref 5–15)
BUN: 9 mg/dL (ref 8–23)
CO2: 37 mmol/L — ABNORMAL HIGH (ref 22–32)
Calcium: 8.7 mg/dL — ABNORMAL LOW (ref 8.9–10.3)
Chloride: 96 mmol/L — ABNORMAL LOW (ref 98–111)
Creatinine, Ser: 0.52 mg/dL (ref 0.44–1.00)
GFR calc Af Amer: >60 mL/min (ref >60)
GFR calc non Af Amer: >60 mL/min (ref >60)
Glucose, Bld: 179 mg/dL — ABNORMAL HIGH (ref 70–99)
Potassium: 3.8 mmol/L (ref 3.5–5.1)
Sodium: 141 mmol/L (ref 135–145)
Total Bilirubin: 0.7 mg/dL (ref 0.3–1.2)
Total Protein: 6.4 g/dL — ABNORMAL LOW (ref 6.5–8.1)

## 2019-05-12 LAB — AMMONIA: Ammonia: 41 umol/L — ABNORMAL HIGH (ref 9–35)

## 2019-05-12 LAB — CBG MONITORING, ED: Glucose-Capillary: 166 mg/dL — ABNORMAL HIGH (ref 70–99)

## 2019-05-12 MED ORDER — PRAVASTATIN SODIUM 10 MG PO TABS
10.0000 mg | ORAL_TABLET | Freq: Every day | ORAL | Status: DC
  Administered 2019-05-13 – 2019-05-15 (×3): 10 mg via ORAL
  Filled 2019-05-12 (×5): qty 1

## 2019-05-12 MED ORDER — BISACODYL 5 MG PO TBEC: 5.0000 mg | DELAYED_RELEASE_TABLET | Freq: Every day | ORAL | Status: DC | PRN

## 2019-05-12 MED ORDER — FUROSEMIDE 20 MG PO TABS
20.0000 mg | ORAL_TABLET | Freq: Every morning | ORAL | Status: DC
  Administered 2019-05-13 – 2019-05-16 (×4): 20 mg via ORAL
  Filled 2019-05-12 (×4): qty 1

## 2019-05-12 MED ORDER — SODIUM CHLORIDE 0.9% FLUSH: 3.0000 mL | INTRAVENOUS | Status: DC | PRN

## 2019-05-12 MED ORDER — INSULIN ASPART 100 UNIT/ML ~~LOC~~ SOLN
0.0000 [IU] | Freq: Every day | SUBCUTANEOUS | Status: DC
  Administered 2019-05-14: 22:00:00 2 [IU] via SUBCUTANEOUS
  Administered 2019-05-15: 3 [IU] via SUBCUTANEOUS

## 2019-05-12 MED ORDER — DILTIAZEM HCL ER COATED BEADS 120 MG PO CP24
120.0000 mg | ORAL_CAPSULE | Freq: Every evening | ORAL | Status: DC
  Filled 2019-05-12: qty 1

## 2019-05-12 MED ORDER — IPRATROPIUM BROMIDE 0.02 % IN SOLN
RESPIRATORY_TRACT | Status: AC
  Administered 2019-05-12: 22:00:00 0.5 mg
  Filled 2019-05-12: qty 2.5

## 2019-05-12 MED ORDER — ALBUTEROL SULFATE (2.5 MG/3ML) 0.083% IN NEBU
2.5000 mg | INHALATION_SOLUTION | RESPIRATORY_TRACT | Status: DC | PRN
  Administered 2019-05-12: 22:00:00 2.5 mg via RESPIRATORY_TRACT
  Filled 2019-05-12: qty 3

## 2019-05-12 MED ORDER — IPRATROPIUM-ALBUTEROL 0.5-2.5 (3) MG/3ML IN SOLN
3.0000 mL | Freq: Four times a day (QID) | RESPIRATORY_TRACT | Status: DC
  Administered 2019-05-12 – 2019-05-13 (×5): 3 mL via RESPIRATORY_TRACT
  Filled 2019-05-12 (×5): qty 3

## 2019-05-12 MED ORDER — INSULIN ASPART 100 UNIT/ML ~~LOC~~ SOLN
0.0000 [IU] | Freq: Three times a day (TID) | SUBCUTANEOUS | Status: DC
  Administered 2019-05-13: 4 [IU] via SUBCUTANEOUS
  Administered 2019-05-13: 3 [IU] via SUBCUTANEOUS
  Administered 2019-05-13 – 2019-05-14 (×2): 4 [IU] via SUBCUTANEOUS
  Administered 2019-05-14: 7 [IU] via SUBCUTANEOUS
  Administered 2019-05-14: 3 [IU] via SUBCUTANEOUS
  Administered 2019-05-15: 4 [IU] via SUBCUTANEOUS
  Administered 2019-05-15: 12:00:00 11 [IU] via SUBCUTANEOUS
  Administered 2019-05-15 – 2019-05-16 (×2): 4 [IU] via SUBCUTANEOUS
  Administered 2019-05-16: 11 [IU] via SUBCUTANEOUS
  Administered 2019-05-16: 4 [IU] via SUBCUTANEOUS
  Filled 2019-05-12: qty 1

## 2019-05-12 MED ORDER — ALBUTEROL SULFATE (2.5 MG/3ML) 0.083% IN NEBU
INHALATION_SOLUTION | RESPIRATORY_TRACT | Status: AC
  Administered 2019-05-12: 22:00:00 5 mg
  Filled 2019-05-12: qty 6

## 2019-05-12 MED ORDER — SODIUM CHLORIDE 0.9 % IV SOLN: 250.0000 mL | INTRAVENOUS | Status: DC | PRN

## 2019-05-12 MED ORDER — INSULIN GLARGINE 100 UNIT/ML ~~LOC~~ SOLN
10.0000 [IU] | Freq: Every day | SUBCUTANEOUS | Status: DC
  Administered 2019-05-13 – 2019-05-15 (×4): 10 [IU] via SUBCUTANEOUS
  Filled 2019-05-12 (×5): qty 0.1

## 2019-05-12 MED ORDER — LACTULOSE 10 GM/15ML PO SOLN
20.0000 g | Freq: Two times a day (BID) | ORAL | Status: DC
  Administered 2019-05-13 – 2019-05-16 (×7): 20 g via ORAL
  Filled 2019-05-12 (×7): qty 30

## 2019-05-12 MED ORDER — ENOXAPARIN SODIUM 40 MG/0.4ML ~~LOC~~ SOLN
40.0000 mg | SUBCUTANEOUS | Status: DC
  Administered 2019-05-13 – 2019-05-14 (×3): 40 mg via SUBCUTANEOUS
  Filled 2019-05-12 (×3): qty 0.4

## 2019-05-12 MED ORDER — ASPIRIN EC 81 MG PO TBEC
81.0000 mg | DELAYED_RELEASE_TABLET | Freq: Every morning | ORAL | Status: DC
  Administered 2019-05-13 – 2019-05-16 (×4): 81 mg via ORAL
  Filled 2019-05-12 (×4): qty 1

## 2019-05-12 MED ORDER — LISINOPRIL 5 MG PO TABS
2.5000 mg | ORAL_TABLET | Freq: Every morning | ORAL | Status: DC
  Administered 2019-05-13: 2.5 mg via ORAL
  Filled 2019-05-12: qty 1

## 2019-05-12 MED ORDER — MIRTAZAPINE 15 MG PO TABS
15.0000 mg | ORAL_TABLET | Freq: Every day | ORAL | Status: DC
  Administered 2019-05-13 – 2019-05-15 (×3): 15 mg via ORAL
  Filled 2019-05-12 (×3): qty 1

## 2019-05-12 MED ORDER — DEXAMETHASONE SODIUM PHOSPHATE 10 MG/ML IJ SOLN
10.0000 mg | Freq: Once | INTRAMUSCULAR | Status: AC
  Administered 2019-05-12: 20:00:00 10 mg via INTRAVENOUS
  Filled 2019-05-12: qty 1

## 2019-05-12 MED ORDER — ALBUTEROL SULFATE HFA 108 (90 BASE) MCG/ACT IN AERS
4.0000 | INHALATION_SPRAY | Freq: Once | RESPIRATORY_TRACT | Status: AC
  Administered 2019-05-12: 20:00:00 4 via RESPIRATORY_TRACT
  Filled 2019-05-12: qty 6.7

## 2019-05-12 MED ORDER — METHYLPREDNISOLONE SODIUM SUCC 125 MG IJ SOLR
60.0000 mg | Freq: Four times a day (QID) | INTRAMUSCULAR | Status: DC
  Administered 2019-05-12 – 2019-05-13 (×4): 60 mg via INTRAVENOUS
  Filled 2019-05-12 (×4): qty 2

## 2019-05-12 MED ORDER — METHOCARBAMOL 500 MG PO TABS: 500.0000 mg | ORAL_TABLET | Freq: Three times a day (TID) | ORAL | Status: DC | PRN

## 2019-05-12 MED ORDER — MOMETASONE FURO-FORMOTEROL FUM 100-5 MCG/ACT IN AERO
2.0000 | INHALATION_SPRAY | Freq: Every day | RESPIRATORY_TRACT | Status: DC
  Administered 2019-05-13 – 2019-05-16 (×4): 2 via RESPIRATORY_TRACT
  Filled 2019-05-12 (×2): qty 8.8

## 2019-05-12 MED ORDER — FAMOTIDINE 20 MG PO TABS
20.0000 mg | ORAL_TABLET | Freq: Two times a day (BID) | ORAL | Status: DC
  Administered 2019-05-13 – 2019-05-16 (×7): 20 mg via ORAL
  Filled 2019-05-12 (×7): qty 1

## 2019-05-12 MED ORDER — GABAPENTIN 400 MG PO CAPS
400.0000 mg | ORAL_CAPSULE | Freq: Two times a day (BID) | ORAL | Status: DC
  Administered 2019-05-13 – 2019-05-16 (×7): 400 mg via ORAL
  Filled 2019-05-12 (×7): qty 1

## 2019-05-12 MED ORDER — SODIUM CHLORIDE 0.9 % IV SOLN: INTRAVENOUS | Status: DC

## 2019-05-12 MED ORDER — SODIUM CHLORIDE 0.9% FLUSH
3.0000 mL | Freq: Two times a day (BID) | INTRAVENOUS | Status: DC
  Administered 2019-05-13 – 2019-05-16 (×7): 3 mL via INTRAVENOUS

## 2019-05-12 NOTE — H&P (Signed)
History and Physical  Heather Newman SWH:[id number] DOB: 1940/04/11 DOA: 05/12/2019  Referring physician: Dr Eulis Foster, ED physician PCP: [name], NP  Outpatient Specialists:   Patient Coming From: home  Chief Complaint: SOB, altered metal status  HPI: Heather Newman is a 79 y.o. female with a history of dementia, DM2, atrial fibrillation (not on medications due to falls), COPD, systolic heart failure, HTN, cirrhosis, OSA.  Patient was in a nursing home in [place] and was recently taken out of the nursing home and brought to [place] by the patient's granddaughter.  She currently lives with her granddaughter and a caretaker.  As the patient is acutely encephalopathic, history was obtained from the caretaker after verbal permission from patient's granddaughter.  Patient has been getting increasingly short of breath over the past couple of days, but acutely worsened today they have been trying to give her albuterol inhalers, although this has not been completely accessible.  Due to increasing auditory hallucinations, EMS was called and they noted that she was extremely hypoxic to the 70s but responded adequately to a nonrebreather. They brought her to the emergency room for evaluation.  It appears that she was unable to get her [facility] and has been off her inhaled corticosteroid and long-acting bronchodilator.  Emergency Department Course: Patient desatted to the 70s off oxygen.  Checks x-ray shows chronic scarring.  VBG shows a pH of 7.25, PCO2 of 93, PO2 of 77.  Her bicarb is 37.  Covid test is negative  Review of Systems:  Patient unable to provide  Past Medical History:  Diagnosis Date  . Altered mental status, unspecified   . Alzheimer's disease, unspecified (CODE) ([place])   . Atherosclerosis of native arteries of right leg with ulceration of other part of foot ([place])   . Atherosclerotic heart disease of native coronary artery without angina pectoris   . Atrial  premature depolarization   . Bilateral primary osteoarthritis of knee   . Calculus of ureter   . Chronic systolic (congestive) heart failure ([place])   . Circadian rhythm sleep disorder, advanced sleep phase type   . Cognitive communication deficit   . Constipation, unspecified   . Dementia in other diseases classified elsewhere without behavioral disturbance ([place])   . Difficulty in walking, not elsewhere classified   . Displaced spiral fracture of shaft of left femur, subsequent encounter for closed fracture with routine healing   . Emphysema, unspecified ([place])   . Erythematous condition, unspecified   . GERD (gastroesophageal reflux disease)   . Hyperlipidemia   . Hypertension   . Immobility syndrome (paraplegic)   . Insomnia due to medical condition   . Left bundle-branch block, unspecified   . Localized edema   . Major depressive disorder, single episode, unspecified   . Melanocytic nevi, unspecified   . Metabolic encephalopathy   . Nicotine dependence, unspecified, uncomplicated   . Obstructive sleep apnea (adult) (pediatric)   . Other cirrhosis of liver ([place])   . Other disorders of peripheral nervous system   . Other fatigue   . Other hypersomnia   . Other symptoms and signs concerning food and fluid intake   . Other symptoms and signs involving the musculoskeletal system   . Pain, unspecified   . Sleep disorder   . Snoring   . Stereotyped movement disorders   . Unspecified atrial fibrillation ([place])   . Vitamin D deficiency, unspecified    History reviewed. No pertinent surgical history. Social History:  reports that she has been  smoking. She has never used smokeless tobacco. No history on file for alcohol and drug. Patient lives at home  Allergies  Allergen Reactions  . Prednisone     Increase in blood sugar levels that resulted in medication change per family    History reviewed. No pertinent family history.  Patient unable to provide  Prior to Admission  medications   Medication Sig Start Date End Date Taking? Authorizing Provider  acetaminophen (TYLENOL) 325 MG tablet Take 325-650 mg by mouth every 6 (six) hours as needed for mild pain or moderate pain.   Yes [provider]  aspirin EC 81 MG tablet Take 81 mg by mouth every morning.    Yes [provider]  bisacodyl (DULCOLAX) 5 MG EC tablet Take 5 mg by mouth daily as needed for moderate constipation.   Yes [provider]  diltiazem (CARTIA XT) 120 MG 24 hr capsule Take 1 capsule (120 mg total) by mouth daily. Patient taking differently: Take 120 mg by mouth every evening.  05/07/19  Yes [name], NP  famotidine (PEPCID) 20 MG tablet Take 20 mg by mouth 2 (two) times daily.   Yes [provider]  furosemide (LASIX) 20 MG tablet Take 1 tablet (20 mg total) by mouth daily. Patient taking differently: Take 20 mg by mouth every morning.  04/23/19  Yes [name], NP  gabapentin (NEURONTIN) 400 MG capsule Take 1 capsule (400 mg total) by mouth 2 (two) times daily. 04/23/19  Yes [name], NP  ipratropium-albuterol (DUONEB) 0.5-2.5 (3) MG/3ML SOLN Take 3 mLs by nebulization 2 (two) times daily. 03/29/19  Yes Fayrene Helper, MD  lactulose (CHRONULAC) 10 GM/15ML solution Take 45 mLs (30 g total) by mouth 2 (two) times daily. Patient taking differently: Take 20 g by mouth 2 (two) times daily.  04/17/19  Yes [name], NP  lisinopril (ZESTRIL) 2.5 MG tablet Take 1 tablet (2.5 mg total) by mouth daily. Patient taking differently: Take 2.5 mg by mouth every morning.  04/09/19  Yes Fayrene Helper, MD  magnesium hydroxide (MILK OF MAGNESIA) 400 MG/5ML suspension Take 30 mLs by mouth daily as needed for mild constipation.   Yes [provider]  metFORMIN (GLUCOPHAGE) 500 MG tablet Take 1 tablet (500 mg total) by mouth daily. 04/17/19  Yes [name], NP  methocarbamol (ROBAXIN) 500 MG tablet Take 500 mg by mouth every 8 (eight) hours  as needed for muscle spasms.   Yes [provider]  mirtazapine (REMERON) 15 MG tablet Take 1 tablet (15 mg total) by mouth at bedtime. 05/01/19  Yes [name], NP  pravastatin (PRAVACHOL) 10 MG tablet Take 1 tablet (10 mg total) by mouth daily. Patient taking differently: Take 10 mg by mouth at bedtime.  04/09/19  Yes Fayrene Helper, MD  Vitamin D, Ergocalciferol, (DRISDOL) 1.25 MG (50000 UNIT) CAPS capsule Take 1 capsule (50,000 Units total) by mouth every 7 (seven) days. Patient taking differently: Take 50,000 Units by mouth every Monday.  04/09/19  Yes Fayrene Helper, MD  albuterol (PROVENTIL) (2.5 MG/3ML) 0.083% nebulizer solution Take 3 mLs (2.5 mg total) by nebulization every 6 (six) hours as needed for wheezing or shortness of breath. 03/28/19   [name], NP  blood glucose meter kit and supplies Dispense based on patient and insurance preference. Use up to four times daily as directed. (FOR ICD-10 E10.9, E11.9). Patient not taking: Reported on 05/12/2019 03/28/19   Cherly Beach  M, NP  cephALEXin (KEFLEX) 250 MG capsule Take 1 capsule (250 mg total) by mouth 3 (three) times daily. Patient not taking: Reported on 05/12/2019 04/11/19   Fayrene Helper, MD  mometasone-formoterol [facility]) 100-5 MCG/ACT AERO Inhale 2 puffs into the lungs daily. Patient not taking: Reported on 05/12/2019 04/10/19   [name], NP  UNABLE TO Plain pads 60/mth Patient not taking: Reported on 05/12/2019 03/28/19   [name], NP  UNABLE TO FIND Depends Diapers up to 72" 120/mth Patient not taking: Reported on 05/12/2019 03/28/19   [name], NP    Physical Exam: BP (!) 171/62   Pulse 95   Temp 97.7 F (36.5 C) (Oral)   Resp (!) 28   Ht '5\' 6"'$  (1.676 m)   Wt 76.2 kg   SpO2 96% Comment: Simultaneous filing. User may not have seen previous data.  BMI 27.12 kg/m   . General: Elderly female. Awake and alert and oriented x3. No acute cardiopulmonary distress.   Marland Kitchen HEENT: Normocephalic atraumatic.  Right and left ears normal in appearance.  Pupils equal, round, reactive to light. Extraocular muscles are intact. Sclerae anicteric and noninjected.  Moist mucosal membranes. No mucosal lesions.  . Neck: Neck supple without lymphadenopathy. No carotid bruits. No masses palpated.  . Cardiovascular: Regular rate with normal S1-S2 sounds. No murmurs, rubs, gallops auscultated. No JVD.  Marland Kitchen Respiratory: Lungs exhalation.  Coarse wheezing throughout.  Mild accessory muscle use. . Abdomen: Soft, nontender, nondistended. Active bowel sounds. No masses or hepatosplenomegaly  . Skin: Sacral ulcer.  No other rashes, lesions, or ulcerations.  Dry, warm to touch. 2+ dorsalis pedis and radial pulses. . Musculoskeletal: No calf or leg pain. All major joints not erythematous nontender.  No upper or lower joint deformation.  Good ROM.  No contractures  . Psychiatric: Intact judgment and insight. Pleasant and cooperative. . Neurologic: No focal neurological deficits. Strength is 5/5 and symmetric in upper and lower extremities.  Cranial nerves II through XII are grossly intact.           Labs on Admission: I have personally reviewed following labs and imaging studies  CBC: Recent Labs  Lab 05/12/19 1935  WBC 5.9  NEUTROABS 4.0  HGB 12.5  HCT 43.1  MCV 106.4*  PLT 282   Basic Metabolic Panel: Recent Labs  Lab 05/12/19 1935  NA 141  K 3.8  CL 96*  CO2 37*  GLUCOSE 179*  BUN 9  CREATININE 0.52  CALCIUM 8.7*   GFR: Estimated Creatinine Clearance: 61.5 mL/min (by C-G formula based on SCr of 0.52 mg/dL). Liver Function Tests: Recent Labs  Lab 05/12/19 1935  AST 23  ALT 20  ALKPHOS 115  BILITOT 0.7  PROT 6.4*  ALBUMIN 3.1*   No results for input(s): LIPASE, AMYLASE in the last 168 hours. Recent Labs  Lab 05/12/19 1935  AMMONIA 41*   Coagulation Profile: No results for input(s): INR, PROTIME in the last 168 hours. Cardiac Enzymes: No results  for input(s): CKTOTAL, CKMB, CKMBINDEX, TROPONINI in the last 168 hours. BNP (last 3 results) No results for input(s): PROBNP in the last 8760 hours. HbA1C: No results for input(s): HGBA1C in the last 72 hours. CBG: No results for input(s): GLUCAP in the last 168 hours. Lipid Profile: No results for input(s): CHOL, HDL, LDLCALC, TRIG, CHOLHDL, LDLDIRECT in the last 72 hours. Thyroid Function Tests: No results for input(s): TSH, T4TOTAL, FREET4, T3FREE, THYROIDAB in the last 72 hours. Anemia Panel:  No results for input(s): VITAMINB12, FOLATE, FERRITIN, TIBC, IRON, RETICCTPCT in the last 72 hours. Urine analysis: No results found for: COLORURINE, APPEARANCEUR, LABSPEC, PHURINE, GLUCOSEU, HGBUR, BILIRUBINUR, KETONESUR, PROTEINUR, UROBILINOGEN, NITRITE, LEUKOCYTESUR Sepsis Labs: '@LABRCNTIP'$ (procalcitonin:4,lacticidven:4) ) Recent Results (from the past 240 hour(s))  Respiratory Panel by RT PCR (Flu A&B, Covid) - Nasopharyngeal Swab     Status: None   Collection Time: 05/12/19  7:35 PM   Specimen: Nasopharyngeal Swab  Result Value Ref Range Status   SARS Coronavirus 2 by RT PCR NEGATIVE NEGATIVE Final    Comment: (NOTE) SARS-CoV-2 target nucleic acids are NOT DETECTED. The SARS-CoV-2 RNA is generally detectable in upper respiratoy specimens during the acute phase of infection. The lowest concentration of SARS-CoV-2 viral copies this assay can detect is 131 copies/mL. A negative result does not preclude SARS-Cov-2 infection and should not be used as the sole basis for treatment or other patient management decisions. A negative result may occur with  improper specimen collection/handling, submission of specimen other than nasopharyngeal swab, presence of viral mutation(s) within the areas targeted by this assay, and inadequate number of viral copies (<131 copies/mL). A negative result must be combined with clinical observations, patient history, and epidemiological information.  The expected result is Negative. Fact Sheet for Patients:  PinkCheek.be Fact Sheet for Healthcare Providers:  GravelBags.it This test is not yet ap proved or cleared by the [place] FDA and  has been authorized for detection and/or diagnosis of SARS-CoV-2 by FDA under an Emergency Use Authorization (EUA). This EUA will remain  in effect (meaning this test can be used) for the duration of the COVID-19 declaration under Section 564(b)(1) of the Act, 21 U.S.C. section 360bbb-3(b)(1), unless the authorization is terminated or revoked sooner.    Influenza A by PCR NEGATIVE NEGATIVE Final   Influenza B by PCR NEGATIVE NEGATIVE Final    Comment: (NOTE) The Xpert Xpress SARS-CoV-2/FLU/RSV assay is intended as an aid in  the diagnosis of influenza from Nasopharyngeal swab specimens and  should not be used as a sole basis for treatment. Nasal washings and  aspirates are unacceptable for Xpert Xpress SARS-CoV-2/FLU/RSV  testing. Fact Sheet for Patients: PinkCheek.be Fact Sheet for Healthcare Providers: GravelBags.it This test is not yet approved or cleared by the [place] FDA and  has been authorized for detection and/or diagnosis of SARS-CoV-2 by  FDA under an Emergency Use Authorization (EUA). This EUA will remain  in effect (meaning this test can be used) for the duration of the  Covid-19 declaration under Section 564(b)(1) of the Act, 21  U.S.C. section 360bbb-3(b)(1), unless the authorization is  terminated or revoked. Performed at [facility], [address], [place], [place] [address]      Radiological Exams on Admission: DG Chest [facility] 1 View  Result Date: 05/12/2019 CLINICAL DATA:  Shortness of breath. EXAM: PORTABLE CHEST 1 VIEW COMPARISON:  None. FINDINGS: Tortuous thoracic aorta. Heart size appears mildly enlarged. There are diffuse bilateral coarse  interstitial opacities which may reflect chronic bronchitic change and scarring. No definite focal infiltrate. No pneumothorax or large pleural effusion. No acute finding in the visualized skeleton. IMPRESSION: Diffuse bilateral coarse interstitial opacities which may reflect chronic bronchitic change and scarring. No definite active disease. Electronically Signed   By: Audie Pinto M.D.   On: 05/12/2019 20:03    EKG: Independently reviewed.  Sinus rhythm with left bundle branch block.  No acute ST changes  Assessment/Plan: Principal Problem:   Acute respiratory failure with hypoxia and hypercapnia (  [place]) Active Problems:   Obstructive sleep apnea (adult) (pediatric)   Chronic systolic (congestive) heart failure (HCC)   Dementia in other diseases classified elsewhere without behavioral disturbance (H[facility])   Acute metabolic encephalopathy   Unspecified atrial fibrillation (HCC)   Other cirrhosis of liver (HCC)   Diabetes mellitus type 2 with complications ([place])   COPD with acute exacerbation ([place])    This patient was discussed with the ED physician, including pertinent vitals, physical exam findings, labs, and imaging.  We also discussed care given by the ED provider.  1. Acute respiratory failure with hypoxia and hypercapnia a. Admit to stepdown b. May need to start the patient on BiPAP c. Respiratory therapy consulted 2. COPD with acute exacerbation DuoNeb's every 6 scheduled with albuterol every 2 when necessary Restart inhaled steroids and LA bronchodilator Solu-Medrol 60 mg IV every 6 hours Mucinex 3. Diabetes a. Hold metformin b. Lantus 10 mg with sliding scale insulin 4. Cirrhosis a. Check ammonia level b. Continue lactulose 5. Acute metabolic encephalopathy with underlying dementia a. Likely secondary to hypercarbia, although we will check ammonia 6. Atrial fibrillation a. Currently in sinus rhythm. b. Not on anticoagulation secondary to falls 7. Chronic systolic  heart failure a. Appears compensated 8. Obstructive sleep apnea  DVT prophylaxis: Lovenox Consultants: None Code Status: Full code Family Communication: Spoke with patient's granddaughter and caregiver Disposition Plan: We will likely need skilled nursing facility following admission versus long-term placement   Truett Mainland, DO

## 2019-05-12 NOTE — ED Triage Notes (Addendum)
EMS called out for SOB. Pt does not wear O2 at home but EMS stated that she was 34% on room air when they arrived but stated that pt had "very cold hands". Pt was placed on 100% NRB and her sats came up to 97% and pt became alert. Pt o2 sats 79% on 4l nasal cannula upon arrival to ED so pt placed back on 100% NRB.

## 2019-05-12 NOTE — ED Notes (Signed)
Placed patient on BiPAP for wob and high PCO2.
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
## 2019-05-13 ENCOUNTER — Encounter (HOSPITAL_COMMUNITY): Payer: Self-pay | Admitting: Family Medicine

## 2019-05-13 DIAGNOSIS — J9602 Acute respiratory failure with hypercapnia: Secondary | ICD-10-CM

## 2019-05-13 DIAGNOSIS — L8990 Pressure ulcer of unspecified site, unspecified stage: Secondary | ICD-10-CM | POA: Diagnosis present

## 2019-05-13 DIAGNOSIS — J9601 Acute respiratory failure with hypoxia: Secondary | ICD-10-CM

## 2019-05-13 LAB — CBC
HCT: 45 % (ref 36.0–46.0)
Hemoglobin: 12.9 g/dL (ref 12.0–15.0)
MCH: 30.4 pg (ref 26.0–34.0)
MCHC: 28.7 g/dL — ABNORMAL LOW (ref 30.0–36.0)
MCV: 106.1 fL — ABNORMAL HIGH (ref 80.0–100.0)
Platelets: 192 10*3/uL (ref 150–400)
RBC: 4.24 MIL/uL (ref 3.87–5.11)
RDW: 17.9 % — ABNORMAL HIGH (ref 11.5–15.5)
WBC: 5 10*3/uL (ref 4.0–10.5)
nRBC: 0 % (ref 0.0–0.2)

## 2019-05-13 LAB — BLOOD GAS, VENOUS
Acid-Base Excess: 10.5 mmol/L — ABNORMAL HIGH (ref 0.0–2.0)
Bicarbonate: 31.1 mmol/L — ABNORMAL HIGH (ref 20.0–28.0)
FIO2: 21
O2 Saturation: 71.3 %
Patient temperature: 37
pCO2, Ven: 75.6 mmHg (ref 44.0–60.0)
pH, Ven: 7.309 (ref 7.250–7.430)
pO2, Ven: 41.6 mmHg (ref 32.0–45.0)

## 2019-05-13 LAB — TROPONIN I (HIGH SENSITIVITY)
Troponin I (High Sensitivity): 13 ng/L (ref <18)
Troponin I (High Sensitivity): 12 ng/L (ref <18)

## 2019-05-13 LAB — BASIC METABOLIC PANEL
Anion gap: 7 (ref 5–15)
BUN: 11 mg/dL (ref 8–23)
CO2: 34 mmol/L — ABNORMAL HIGH (ref 22–32)
Calcium: 8.5 mg/dL — ABNORMAL LOW (ref 8.9–10.3)
Chloride: 101 mmol/L (ref 98–111)
Creatinine, Ser: 0.52 mg/dL (ref 0.44–1.00)
GFR calc Af Amer: >60 mL/min (ref >60)
GFR calc non Af Amer: >60 mL/min (ref >60)
Glucose, Bld: 175 mg/dL — ABNORMAL HIGH (ref 70–99)
Potassium: 4 mmol/L (ref 3.5–5.1)
Sodium: 142 mmol/L (ref 135–145)

## 2019-05-13 LAB — D-DIMER, QUANTITATIVE: D-Dimer, Quant: 0.72 ug/mL-FEU — ABNORMAL HIGH (ref 0.00–0.50)

## 2019-05-13 LAB — HEMOGLOBIN A1C
Hgb A1c MFr Bld: 5.5 % (ref 4.8–5.6)
Mean Plasma Glucose: 111.15 mg/dL

## 2019-05-13 LAB — MRSA PCR SCREENING: MRSA by PCR: NEGATIVE

## 2019-05-13 LAB — BRAIN NATRIURETIC PEPTIDE: B Natriuretic Peptide: 696 pg/mL — ABNORMAL HIGH (ref 0.0–100.0)

## 2019-05-13 LAB — GLUCOSE, CAPILLARY
Glucose-Capillary: 130 mg/dL — ABNORMAL HIGH (ref 70–99)
Glucose-Capillary: 188 mg/dL — ABNORMAL HIGH (ref 70–99)
Glucose-Capillary: 145 mg/dL — ABNORMAL HIGH (ref 70–99)

## 2019-05-13 LAB — CBG MONITORING, ED: Glucose-Capillary: 168 mg/dL — ABNORMAL HIGH (ref 70–99)

## 2019-05-13 MED ORDER — METHYLPREDNISOLONE SODIUM SUCC 125 MG IJ SOLR
60.0000 mg | Freq: Three times a day (TID) | INTRAMUSCULAR | Status: DC
  Administered 2019-05-13 – 2019-05-14 (×3): 60 mg via INTRAVENOUS
  Filled 2019-05-13 (×3): qty 2

## 2019-05-13 MED ORDER — DOXYCYCLINE HYCLATE 100 MG PO TABS
100.0000 mg | ORAL_TABLET | Freq: Two times a day (BID) | ORAL | Status: DC
  Administered 2019-05-13 – 2019-05-16 (×6): 100 mg via ORAL
  Filled 2019-05-13 (×6): qty 1

## 2019-05-13 MED ORDER — CHLORHEXIDINE GLUCONATE CLOTH 2 % EX PADS
6.0000 | MEDICATED_PAD | Freq: Every day | CUTANEOUS | Status: DC
  Administered 2019-05-13 – 2019-05-16 (×4): 6 via TOPICAL

## 2019-05-13 MED ORDER — NICOTINE 21 MG/24HR TD PT24
21.0000 mg | MEDICATED_PATCH | Freq: Every day | TRANSDERMAL | Status: DC
  Administered 2019-05-13 – 2019-05-16 (×4): 21 mg via TRANSDERMAL
  Filled 2019-05-13 (×4): qty 1

## 2019-05-13 MED ORDER — DILTIAZEM HCL ER COATED BEADS 120 MG PO CP24
120.0000 mg | ORAL_CAPSULE | Freq: Every day | ORAL | Status: DC
  Administered 2019-05-13: 120 mg via ORAL
  Filled 2019-05-13: qty 1

## 2019-05-13 MED ORDER — SODIUM CHLORIDE 0.9 % IV BOLUS
250.0000 mL | Freq: Once | INTRAVENOUS | Status: AC
  Administered 2019-05-13: 250 mL via INTRAVENOUS

## 2019-05-13 MED ORDER — MUPIROCIN 2 % EX OINT: 1.0000 "application " | TOPICAL_OINTMENT | Freq: Two times a day (BID) | CUTANEOUS | Status: DC

## 2019-05-13 MED ORDER — SALINE SPRAY 0.65 % NA SOLN: 1.0000 | NASAL | Status: DC | PRN

## 2019-05-13 MED ORDER — DILTIAZEM HCL 25 MG/5ML IV SOLN
10.0000 mg | Freq: Once | INTRAVENOUS | Status: AC
  Administered 2019-05-13: 10 mg via INTRAVENOUS
  Filled 2019-05-13: qty 5

## 2019-05-13 MED ORDER — IPRATROPIUM-ALBUTEROL 0.5-2.5 (3) MG/3ML IN SOLN
3.0000 mL | Freq: Three times a day (TID) | RESPIRATORY_TRACT | Status: DC
  Administered 2019-05-14 (×2): 3 mL via RESPIRATORY_TRACT
  Filled 2019-05-13 (×2): qty 3

## 2019-05-13 MED ORDER — DILTIAZEM HCL ER COATED BEADS 180 MG PO CP24
180.0000 mg | ORAL_CAPSULE | Freq: Every day | ORAL | Status: DC
  Administered 2019-05-14: 180 mg via ORAL
  Filled 2019-05-13: qty 1

## 2019-05-13 NOTE — Progress Notes (Signed)
**Note De-identified  Obfuscation** EKG complete and placed in patient chart 

## 2019-05-13 NOTE — Progress Notes (Signed)
Patient Demographics:    Heather Newman, is a 79 y.o. female, DOB - 07-21-1940, [id number]  Admit date - 05/12/2019   Admitting Physician Dent Plantz Denton Brick, MD  Outpatient Primary MD for the patient is [name], NP  LOS - 1   Chief Complaint  Patient presents with  . 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 female smoker with a history of dementia, DM2, atrial fibrillation (not on AC due to falls), COPD, systolic heart failure, HTN, cirrhosis, OSA admitted with hypoxic and hypercapnic respiratory failure on 05/12/2019  A/p 1) acute hypoxic and hypercapnic respiratory failure----apparently patient has been using her granddaughter's oxygen at home--from time to time -So she may actually have acute on chronic respiratory failure -ABG despite BiPAP continues to show hypercapnic and hypoxic respiratory failure -Auscultation of the lungs on imaging of the chest failed to demonstrate definite evidence of pneumonia --Continue BiPAP nightly and as needed during the day Continue IV Solu-Medrol, bronchodilators and mucolytics, doxycycline  added  2) acute COPD exacerbation--- leading to #1 above, treat as above #1  3)PAFib--- sinus tach noted, anticipate persistent tachycardia in the setting of bronchodilator use, -Increase Cardizem CD 180 mg daily from 120mg   4)DM2-A1c 5.5 reflecting excellent diabetic control PTA -Anticipate worsening glycemic control due to steroids  5)Social/Ethics--- recently moved from SNF facility in [place] -Patient is a full code -MCV is elevated, query if alcohol use -Attempted to reach her granddaughter, not successful today  6)H/o OSA--???  If compliance with CPAP or even if she has CPAP at home  7)Tobacco Abuse--- 2 pack-a-day smoker, give nicotine patch  8) acute metabolic encephalopathy superimposed on underlying dementia --Worsening mentation most likely due to hypercapnia and respiratory failure as well as mild hepatic encephalopathy  9) history of liver cirrhosis with mild hepatic encephalopathy--serum ammonia is borderline high at 41, give lactulose as ordered --MCV is elevated, query if alcohol use -Platelets are normal  10) generalized weakness and deconditioning----we will get PT eval when respiratory status improves, may need SNF rehab   Disposition/Need for in-Hospital Stay- patient unable to be discharged at this time due to --significant respiratory decompensation which hypoxia and hypercapnia requiring BiPAP use, and IV steroids -Not medically ready for discharge, anticipate she will need SNF rehab  Code Status : Full  Family Communication:   -Attempted to reach her granddaughter, not successful today -RN spoke with Ms Meda Coffee (friend) Attempted to reach her  granddaughter at [phone], not successful today   Consults  :  na  DVT Prophylaxis  :  Lovenox - - SCDs   Lab Results  Component Value Date   PLT 192 05/13/2019    Inpatient Medications  Scheduled Meds: . aspirin EC  81 mg Oral q morning - 10a  .  Chlorhexidine Gluconate Cloth  6 each Topical Daily  . [START ON 05/14/2019] diltiazem  180 mg Oral Daily  . enoxaparin (LOVENOX) injection  40 mg Subcutaneous Q24H  . famotidine  20 mg Oral BID  . furosemide  20 mg Oral q morning - 10a  . gabapentin  400 mg Oral BID  . insulin aspart  0-20 Units Subcutaneous TID WC  . insulin aspart  0-5 Units Subcutaneous QHS  . insulin glargine  10 Units Subcutaneous QHS  . ipratropium-albuterol  3 mL Nebulization Q6H  . lactulose  20 g Oral BID  . methylPREDNISolone (SOLU-MEDROL) injection  60 mg Intravenous Q8H  . mirtazapine  15 mg Oral QHS  . mometasone-formoterol  2 puff Inhalation Daily  . nicotine  21 mg Transdermal Daily  . pravastatin  10 mg Oral QHS  . sodium chloride flush  3 mL Intravenous Q12H   Continuous Infusions: . sodium chloride     PRN Meds:.sodium chloride, albuterol, bisacodyl, methocarbamol, sodium chloride flush    Anti-infectives (From admission, onward)   None        Objective:   Vitals:   05/13/19 1623 05/13/19 1627 05/13/19 1630 05/13/19 1700  BP:   (!) 106/53 106/60  Pulse: (!) 123 (!) 123 (!) 123 (!) 121  Resp: (!) 26 (!) 25 (!) 25 (!) 25  Temp:      TempSrc:      SpO2: 93% 94% 93% 93%  Weight:      Height:        Wt Readings from Last 3 Encounters:  05/13/19 80.7 kg  04/17/19 78.9 kg  04/03/19 74.8 kg     Intake/Output Summary (Last 24 hours) at 05/13/2019 1712 Last data filed at 05/12/2019 2212 Gross per 24 hour  Intake 312.5 ml  Output --  Net 312.5 ml     Physical Exam  Gen:- Awake Alert, unable to speak in sentences HEENT:- [place].AT, No sclera icterus Nose- [place] 3L/min, alternates with BiPAP Neck-Supple Neck,No JVD,.  Lungs-diminished bilaterally with scattered wheezes CV- S1, S2 normal, regular , tacky Abd-  +ve B.Sounds, Abd Soft, No tenderness,    Extremity/Skin:- No  edema, pedal pulses present  Psych-confusional episodes, some cognitive and memory deficits  neuro-generalized  weakness no new focal deficits,+ve tremors   Data Review:   Micro Results Recent Results (from the past 240 hour(s))  Respiratory Panel by RT PCR (Flu A&B, Covid) - Nasopharyngeal Swab     Status: None   Collection Time: 05/12/19  7:35 PM   Specimen: Nasopharyngeal Swab  Result Value Ref Range Status   SARS Coronavirus 2 by RT PCR NEGATIVE NEGATIVE Final    Comment: (NOTE) SARS-CoV-2 target nucleic acids are NOT DETECTED. The SARS-CoV-2 RNA is generally detectable in upper respiratoy specimens during the acute phase of infection. The lowest concentration of SARS-CoV-2 viral copies this assay can detect is 131 copies/mL. A negative result does not preclude SARS-Cov-2 infection and should not be used as the sole basis for treatment or other patient management decisions. A negative result may occur with  improper specimen collection/handling, submission of specimen other than nasopharyngeal swab, presence of viral mutation(s) within  the areas targeted by this assay, and inadequate number of viral copies (<131 copies/mL). A negative result must be combined with clinical observations, patient history, and epidemiological information. The expected result is Negative. Fact Sheet for Patients:  PinkCheek.be Fact Sheet for Healthcare Providers:  GravelBags.it This test is not yet ap proved or cleared by the [place] FDA and  has been authorized for detection and/or diagnosis of SARS-CoV-2 by FDA under an Emergency Use Authorization (EUA). This EUA will remain  in effect (meaning this test can be used) for the duration of the COVID-19 declaration under Section 564(b)(1) of the Act, 21 U.S.C. section 360bbb-3(b)(1), unless the authorization is terminated or revoked sooner.    Influenza A by PCR NEGATIVE NEGATIVE Final   Influenza B by PCR NEGATIVE NEGATIVE Final    Comment: (NOTE) The Xpert Xpress SARS-CoV-2/FLU/RSV assay is  intended as an aid in  the diagnosis of influenza from Nasopharyngeal swab specimens and  should not be used as a sole basis for treatment. Nasal washings and  aspirates are unacceptable for Xpert Xpress SARS-CoV-2/FLU/RSV  testing. Fact Sheet for Patients: PinkCheek.be Fact Sheet for Healthcare Providers: GravelBags.it This test is not yet approved or cleared by the [place] FDA and  has been authorized for detection and/or diagnosis of SARS-CoV-2 by  FDA under an Emergency Use Authorization (EUA). This EUA will remain  in effect (meaning this test can be used) for the duration of the  Covid-19 declaration under Section 564(b)(1) of the Act, 21  U.S.C. section 360bbb-3(b)(1), unless the authorization is  terminated or revoked. Performed at [facility], [address] [place], [place] [address]     Radiology Reports DG Chest [place] 1 View  Result Date: 05/12/2019 CLINICAL DATA:  Shortness of breath. EXAM: PORTABLE CHEST 1 VIEW COMPARISON:  None. FINDINGS: Tortuous thoracic aorta. Heart size appears mildly enlarged. There are diffuse bilateral coarse interstitial opacities which may reflect chronic bronchitic change and scarring. No definite focal infiltrate. No pneumothorax or large pleural effusion. No acute finding in the visualized skeleton. IMPRESSION: Diffuse bilateral coarse interstitial opacities which may reflect chronic bronchitic change and scarring. No definite active disease. Electronically Signed   By: Audie Pinto M.D.   On: 05/12/2019 20:03     CBC Recent Labs  Lab 05/12/19 1935 05/13/19 0258  WBC 5.9 5.0  HGB 12.5 12.9  HCT 43.1 45.0  PLT 192 192  MCV 106.4* 106.1*  MCH 30.9 30.4  MCHC 29.0* 28.7*  RDW 18.2* 17.9*  LYMPHSABS 0.8  --   MONOABS 1.0  --   EOSABS 0.1  --   BASOSABS 0.1  --     Chemistries  Recent Labs  Lab 05/12/19 1935 05/13/19 0258  NA 141 142  K 3.8 4.0  CL 96* 101    CO2 37* 34*  GLUCOSE 179* 175*  BUN 9 11  CREATININE 0.52 0.52  CALCIUM 8.7* 8.5*  AST 23  --   ALT 20  --   ALKPHOS 115  --   BILITOT 0.7  --    ------------------------------------------------------------------------------------------------------------------ No results for input(s): CHOL, HDL, LDLCALC, TRIG, CHOLHDL, LDLDIRECT in the last 72 hours.  Lab Results  Component Value Date   HGBA1C 5.5 05/12/2019   ------------------------------------------------------------------------------------------------------------------ No results for input(s): TSH, T4TOTAL, T3FREE, THYROIDAB in the last 72 hours.  Invalid input(s): FREET3 ------------------------------------------------------------------------------------------------------------------ No results for input(s): VITAMINB12, FOLATE, FERRITIN, TIBC, IRON, RETICCTPCT in the last 72 hours.  Coagulation profile No results for input(s): INR, PROTIME in  the last 168 hours.  Recent Labs    05/13/19 0258  DDIMER 0.72*    Cardiac Enzymes No results for input(s): CKMB, TROPONINI, MYOGLOBIN in the last 168 hours.  Invalid input(s): CK ------------------------------------------------------------------------------------------------------------------    Component Value Date/Time   BNP 696.0 (H) 05/13/2019 [id number]     Roxan Hockey M.D on 05/13/2019 at 5:12 PM  Go to [email or web address] - for contact info  [facility] - Office  [phone]

## 2019-05-13 NOTE — Progress Notes (Signed)
Patient appears to be pulling and moving more. Still not verbal or awakening, BP still below 100 -nurse reports. Saturation 96 on 35 FiO2. Marland Kitchen

## 2019-05-13 NOTE — Progress Notes (Signed)
Pt HR sustaining in the 120's. Pt asymptomatic and no complaints. MD made aware. EKG performed per emergency standing orders protocol, showed sinus tachycardia. MD to review chart and come assess patient. Will continue to monitor.

## 2019-05-13 NOTE — ED Provider Notes (Signed)
Pt awaiting admission She is currently on bipap She is responsive to voice Her vitals have worsened, with worsening tachycardia/hypotension This has been managed by hospitalist while awaiting admission Repeat EKG is largely unchanged with LBBB  ED ECG REPORT   Date: 05/13/2019 0351  Rate: 94  Rhythm: indeterminate  QRS Axis: normal  Intervals: prolonged QT  ST/T Wave abnormalities: nonspecific ST changes  Conduction Disutrbances:left bundle branch block  Narrative Interpretation:   Old EKG Reviewed: unchanged  I have personally reviewed the EKG tracing and agree with the computerized printout as noted.    Ripley Fraise, MD 05/13/19 [phone]

## 2019-05-13 NOTE — Progress Notes (Signed)
Patient still appears very sleepy does awaken some. She has dementia and arrived early for Low oxygen saturation and shortness of breath worsening today. Wheezing appears to mostly have resolved. Saturation on BiPAP 14/6 -- running at 95. Would hope she would be more alert by now. Suspect her PCO2 runs high chronic 70"s ?? She is beginning to have some heart arhythmia's  Did not get evening Cardizem as she was unable to take. Not sure of her mental base line but most likely will check another blood gas before am unless she arouses more.

## 2019-05-13 NOTE — ED Notes (Signed)
Repeat EKG and spoke with MD about pt's new tachycardia.

## 2019-05-13 NOTE — Progress Notes (Signed)
Pt transitioned from Bi-pap to 4L nasal cannula. Tolerated well. O2 sat's are 98%. RT made aware. Will continue to monitor.

## 2019-05-13 NOTE — TOC Initial Note (Signed)
Transition of Care [facility]) - Initial/Assessment Note    Patient Details  Name: Heather Newman MRN: [id number] Date of Birth: [birth year]  [facility]) CM/SW Contact:    Boneta Lucks, RN Phone Number: 05/13/2019, 3:32 PM  Clinical Narrative:     Patient admitted for respiratory failure. Patient lives with grand -daughter and her friend, which both are disabled. CM spoke with Doristine Devoid the friend.  She explains the situation.  Patient was in a facility in [place] for rehab and they had worked out a facility to facility transfer to [facility].  Once patient got her on Dec 12th. [facility] could not longer take her due to being a COVID SNF. They attempted to care for her in the home, getting PCP and HH. Patient continues to decline and  They can not give her the care she needs and requesting nursing facility.  Patient has deconditioned and will need SNF. PASSR pending, needing a PT evaluation. FL2 done.             Expected Discharge Plan: Skilled Nursing Facility Barriers to Discharge: Continued Medical Work up   Patient Goals and CMS Choice Patient states their goals for this hospitalization and ongoing recovery are:: to go to skilled nursing facility CMS Medicare.gov Compare Post Acute Care list provided to:: Patient Represenative (must comment) Choice offered to / list presented to : Adult Children  Expected Discharge Plan and Services Expected Discharge Plan: [place] Acute Care Choice: Durable Medical Equipment Living arrangements for the past 2 months: Single Family Home                   Prior Living Arrangements/Services Living arrangements for the past 2 months: Single Family Home Lives with:: Relatives          Need for Family Participation in Patient Care: Yes (Comment) Care giver support system in place?: Yes (comment) Current home services: DME Criminal Activity/Legal Involvement Pertinent to Current Situation/Hospitalization:  No - Comment as needed  Activities of Daily Living Home Assistive Devices/Equipment: None ADL Screening (condition at time of admission) Patient's cognitive ability adequate to safely complete daily activities?: No Is the patient deaf or have difficulty hearing?: No Does the patient have difficulty seeing, even when wearing glasses/contacts?: No Does the patient have difficulty concentrating, remembering, or making decisions?: No Patient able to express need for assistance with ADLs?: Yes Does the patient have difficulty dressing or bathing?: Yes Independently performs ADLs?: No Communication: Independent Dressing (OT): Dependent Is this a change from baseline?: Pre-admission baseline Grooming: Dependent Is this a change from baseline?: Pre-admission baseline Feeding: Independent with device (comment) Bathing: Dependent Is this a change from baseline?: Pre-admission baseline Toileting: Dependent Is this a change from baseline?: Pre-admission baseline In/Out Bed: Dependent Is this a change from baseline?: Pre-admission baseline Walks in Home: Dependent Is this a change from baseline?: Pre-admission baseline Does the patient have difficulty walking or climbing stairs?: No Weakness of Legs: None Weakness of Arms/Hands: None  Permission Sought/Granted    Emotional Assessment     Affect (typically observed): Accepting Orientation: : Oriented to Self Alcohol / Substance Use: Not Applicable Psych Involvement: No (comment)  Admission diagnosis:  COPD exacerbation ([place]) [J44.1] Acute respiratory failure with hypoxia and hypercapnia ([place]) [J96.01, J96.02] Acute on chronic respiratory failure with hypoxia and hypercapnia (HCC) [id number], J96.22] Patient Active Problem List   Diagnosis Date Noted  . Acute respiratory failure with hypoxia and hypercapnia (HCC)  05/12/2019  . COPD with acute exacerbation ([place]) 05/12/2019  . Pressure injury of skin of left buttock 04/03/2019  . Pressure  injury of heel, stage 2 ([place]) 04/03/2019  . Diabetes mellitus type 2 with complications ([place]) [id number]  . Nail overgrowth 03/29/2019  . Pressure injury of left heel, stage 2 ([place]) 03/29/2019  . Obstructive sleep apnea (adult) (pediatric)   . Altered mental status, unspecified   . Atherosclerosis of native arteries of right leg with ulceration of other part of foot ([place])   . Atrial premature depolarization   . Calculus of ureter   . Chronic systolic (congestive) heart failure ([place])   . Cognitive communication deficit   . 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 Emphysema, unspecified ([place])   . Immobility syndrome (paraplegic)   . Left bundle-branch block, unspecified   . Major depressive disorder, single episode, unspecified   . Acute metabolic encephalopathy   . Other disorders of peripheral nervous system   . Other hypersomnia   . Other symptoms and signs concerning food and fluid intake   . Pain, unspecified   . Sleep disorder   . Stereotyped movement disorders   . Unspecified atrial fibrillation ([place])   . Alzheimer's disease, unspecified (CODE) ([place])   . Atherosclerotic heart disease of native coronary artery without angina pectoris   . Bilateral primary osteoarthritis of knee   . Circadian rhythm sleep disorder, advanced sleep phase type   . Constipation, unspecified   . Difficulty in walking, not elsewhere classified   . Insomnia due to medical condition   . Erythematous condition, unspecified   . Localized edema   . Melanocytic nevi, unspecified   . Other cirrhosis of liver ([place])   . Other fatigue   . Snoring   . Other symptoms and signs involving the musculoskeletal system   . Vitamin D deficiency, unspecified    PCP:  [name], NP Pharmacy:   [facility] [address], [place] - [place] [address] [place] [address] Phone: [phone] Fax: [phone]

## 2019-05-13 NOTE — NC FL2 (Addendum)
[place] LEVEL OF CARE SCREENING TOOL     IDENTIFICATION  Patient Name: Heather Newman Birthdate: 12/18/1940 Sex: female Admission Date (Current Location): 05/12/2019  [place] and [place] Number:  [place] [id number] [place] and Address:  [facility] [address] [place]      Provider Number: [id number]  Attending Physician Name and Address:  Roxan Hockey, MD  Relative Name and Phone Number:  Domenic Polite grand-daughter  [phone]    Current Level of Care: Hospital Recommended Level of Care: [place] Prior Approval Number:    Date Approved/Denied:   PASRR Number:  [id number]  Discharge Plan: SNF    Current Diagnoses: Patient Active Problem List   Diagnosis Date Noted  . Acute respiratory failure with hypoxia and hypercapnia ([place]) 05/12/2019  . COPD with acute exacerbation ([place]) 05/12/2019  . Pressure injury of skin of left buttock 04/03/2019  . Pressure injury of heel, stage 2 ([place]) 04/03/2019  . Diabetes mellitus type 2 with complications ([place]) [id number]  . Nail overgrowth 03/29/2019  . Pressure injury of left heel, stage 2 ([place]) 03/29/2019  . Obstructive sleep apnea (adult) (pediatric)   . Altered mental status, unspecified   . Atherosclerosis of native arteries of right leg with ulceration of other part of foot ([place])   . Atrial premature depolarization   . Calculus of ureter   . Chronic systolic (congestive) heart failure ([place])   . Cognitive communication deficit   . Dementia in other diseases classified elsewhere without behavioral disturbance ([place])   . Emphysema, unspecified ([place])   . Immobility syndrome (paraplegic)   . Left bundle-branch block, unspecified   . Major depressive disorder, single episode, unspecified   . Acute metabolic encephalopathy   . Other disorders of peripheral nervous system   . Other hypersomnia   . Other symptoms and signs concerning food and fluid intake    . Pain, unspecified   . Sleep disorder   . Stereotyped movement disorders   . Unspecified atrial fibrillation ([place])   . Alzheimer's disease, unspecified (CODE) ([place])   . Atherosclerotic heart disease of native coronary artery without angina pectoris   . Bilateral primary osteoarthritis of knee   . Circadian rhythm sleep disorder, advanced sleep phase type   . Constipation, unspecified   . Difficulty in walking, not elsewhere classified   . Insomnia due to medical condition   . Erythematous condition, unspecified   . Localized edema   . Melanocytic nevi, unspecified   . Other cirrhosis of liver ([place])   . Other fatigue   . Snoring   . Other symptoms and signs involving the musculoskeletal system   . Vitamin D deficiency, unspecified     Orientation RESPIRATION BLADDER Height & Weight     Self  Normal External catheter Weight: 80.7 kg Height:  5\' 5"  (165.1 cm)  BEHAVIORAL SYMPTOMS/MOOD NEUROLOGICAL BOWEL NUTRITION STATUS      Incontinent Diet  AMBULATORY STATUS COMMUNICATION OF NEEDS Skin   Extensive Assist Verbally Normal                       Personal Care Assistance Level of Assistance  Bathing, Dressing, Feeding Bathing Assistance: Maximum assistance Feeding assistance: Limited assistance Dressing Assistance: Maximum assistance     Functional Limitations Info  Sight, Hearing, Speech Sight Info: Adequate Hearing Info: Adequate Speech Info: Adequate    SPECIAL CARE FACTORS FREQUENCY  PT (By licensed PT)     PT Frequency: 5  times a week              Contractures Contractures Info: Not present    Additional Factors Info  Code Status, Allergies Code Status Info: FULL Allergies Info: prednisone           Current Medications (05/13/2019):  This is the current hospital active medication list Current Facility-Administered Medications  Medication Dose Route Frequency Provider Last Rate Last Admin  . 0.9 %  sodium chloride infusion  250 mL Intravenous  PRN Truett Mainland, DO      . albuterol (PROVENTIL) (2.5 MG/3ML) 0.083% nebulizer solution 2.5 mg  2.5 mg Nebulization Q2H PRN Truett Mainland, DO   2.5 mg at 05/12/19 2223  . aspirin EC tablet 81 mg  81 mg Oral q morning - 10a Truett Mainland, DO   81 mg at 05/13/19 1100  . bisacodyl (DULCOLAX) EC tablet 5 mg  5 mg Oral Daily PRN Truett Mainland, DO      . Chlorhexidine Gluconate Cloth 2 % PADS 6 each  6 each Topical Daily Roxan Hockey, MD   6 each at 05/13/19 1024  . diltiazem (CARDIZEM CD) 24 hr capsule 120 mg  120 mg Oral Daily Emokpae, Courage, MD   120 mg at 05/13/19 1117  . enoxaparin (LOVENOX) injection 40 mg  40 mg Subcutaneous Q24H Truett Mainland, DO   40 mg at 05/13/19 1019  . famotidine (PEPCID) tablet 20 mg  20 mg Oral BID Truett Mainland, DO   20 mg at 05/13/19 1100  . furosemide (LASIX) tablet 20 mg  20 mg Oral q morning - 10a Truett Mainland, DO   20 mg at 05/13/19 1100  . gabapentin (NEURONTIN) capsule 400 mg  400 mg Oral BID Truett Mainland, DO   400 mg at 05/13/19 1100  . insulin aspart (novoLOG) injection 0-20 Units  0-20 Units Subcutaneous TID WC Truett Mainland, DO   3 Units at 05/13/19 1151  . insulin aspart (novoLOG) injection 0-5 Units  0-5 Units Subcutaneous QHS Stinson, Jacob J, DO      . insulin glargine (LANTUS) injection 10 Units  10 Units Subcutaneous QHS Truett Mainland, DO   10 Units at 05/13/19 0031  . ipratropium-albuterol (DUONEB) 0.5-2.5 (3) MG/3ML nebulizer solution 3 mL  3 mL Nebulization Q6H Loma Boston J, DO   3 mL at 05/13/19 0851  . lactulose (CHRONULAC) 10 GM/15ML solution 20 g  20 g Oral BID Truett Mainland, DO   20 g at 05/13/19 1100  . lisinopril (ZESTRIL) tablet 2.5 mg  2.5 mg Oral q morning - 10a Truett Mainland, DO   2.5 mg at 05/13/19 1100  . methocarbamol (ROBAXIN) tablet 500 mg  500 mg Oral Q8H PRN Truett Mainland, DO      . methylPREDNISolone sodium succinate (SOLU-MEDROL) 125 mg/2 mL injection 60 mg  60 mg Intravenous Q6H  Truett Mainland, DO   60 mg at 05/13/19 1520  . mirtazapine (REMERON) tablet 15 mg  15 mg Oral QHS Stinson, Jacob J, DO      . mometasone-formoterol (DULERA) 100-5 MCG/ACT inhaler 2 puff  2 puff Inhalation Daily Truett Mainland, DO   2 puff at 05/13/19 1247  . nicotine (NICODERM CQ - dosed in mg/24 hours) patch 21 mg  21 mg Transdermal Daily Emokpae, Courage, MD   21 mg at 05/13/19 1117  . pravastatin (PRAVACHOL) tablet 10 mg  10 mg Oral QHS Stinson,  Tanna Savoy, DO      . sodium chloride flush (NS) 0.9 % injection 3 mL  3 mL Intravenous Q12H Truett Mainland, DO   3 mL at 05/13/19 1019  . sodium chloride flush (NS) 0.9 % injection 3 mL  3 mL Intravenous PRN Truett Mainland, DO         Discharge Medications: Please see discharge summary for a list of discharge medications.  Relevant Imaging Results:  Relevant Lab Results:   Additional Information SS [id number]  Boneta Lucks, RN

## 2019-05-13 NOTE — ED Notes (Signed)
Checking venous Blood gas on patient.

## 2019-05-13 NOTE — Progress Notes (Signed)
Bodenheimer, PA notified of HR 125 at rest.

## 2019-05-13 NOTE — ED Notes (Signed)
Date and time results received: 02/14/210632  Test: pCO2 via VBG Critical Value: 75.6  Name of Provider Notified: Dr. Clearence Ped via Shea Evans  Orders Received? Or Actions Taken?: Actions Taken: n/a

## 2019-05-13 NOTE — ED Notes (Signed)
Blood gas result reported as 21 percent oxygen is incorrect . Actual result should be 35 FiO2 on BiPAP

## 2019-05-14 ENCOUNTER — Ambulatory Visit (HOSPITAL_COMMUNITY): Payer: Medicare Other | Admitting: Physical Therapy

## 2019-05-14 DIAGNOSIS — I4892 Unspecified atrial flutter: Secondary | ICD-10-CM

## 2019-05-14 LAB — CBC
HCT: 40.7 % (ref 36.0–46.0)
Hemoglobin: 12.2 g/dL (ref 12.0–15.0)
MCH: 31 pg (ref 26.0–34.0)
MCHC: 30 g/dL (ref 30.0–36.0)
MCV: 103.6 fL — ABNORMAL HIGH (ref 80.0–100.0)
Platelets: 187 10*3/uL (ref 150–400)
RBC: 3.93 MIL/uL (ref 3.87–5.11)
RDW: 18.6 % — ABNORMAL HIGH (ref 11.5–15.5)
WBC: 5.5 10*3/uL (ref 4.0–10.5)
nRBC: 0 % (ref 0.0–0.2)

## 2019-05-14 LAB — BLOOD GAS, ARTERIAL
Acid-Base Excess: 12.6 mmol/L — ABNORMAL HIGH (ref 0.0–2.0)
Bicarbonate: 35.5 mmol/L — ABNORMAL HIGH (ref 20.0–28.0)
FIO2: 35
O2 Saturation: 98.1 %
Patient temperature: 37
pCO2 arterial: 51.3 mmHg — ABNORMAL HIGH (ref 32.0–48.0)
pH, Arterial: 7.472 — ABNORMAL HIGH (ref 7.350–7.450)
pO2, Arterial: 98 mmHg (ref 83.0–108.0)

## 2019-05-14 LAB — BASIC METABOLIC PANEL
Anion gap: 9 (ref 5–15)
BUN: 23 mg/dL (ref 8–23)
CO2: 34 mmol/L — ABNORMAL HIGH (ref 22–32)
Calcium: 8.6 mg/dL — ABNORMAL LOW (ref 8.9–10.3)
Chloride: 98 mmol/L (ref 98–111)
Creatinine, Ser: 0.68 mg/dL (ref 0.44–1.00)
GFR calc Af Amer: >60 mL/min (ref >60)
GFR calc non Af Amer: >60 mL/min (ref >60)
Glucose, Bld: 172 mg/dL — ABNORMAL HIGH (ref 70–99)
Potassium: 4.1 mmol/L (ref 3.5–5.1)
Sodium: 141 mmol/L (ref 135–145)

## 2019-05-14 LAB — GLUCOSE, CAPILLARY
Glucose-Capillary: 149 mg/dL — ABNORMAL HIGH (ref 70–99)
Glucose-Capillary: 228 mg/dL — ABNORMAL HIGH (ref 70–99)
Glucose-Capillary: 180 mg/dL — ABNORMAL HIGH (ref 70–99)
Glucose-Capillary: 233 mg/dL — ABNORMAL HIGH (ref 70–99)

## 2019-05-14 MED ORDER — LEVALBUTEROL HCL 0.63 MG/3ML IN NEBU
0.6300 mg | INHALATION_SOLUTION | Freq: Three times a day (TID) | RESPIRATORY_TRACT | Status: DC
  Administered 2019-05-14: 0.63 mg via RESPIRATORY_TRACT
  Filled 2019-05-14: qty 3

## 2019-05-14 MED ORDER — METHYLPREDNISOLONE SODIUM SUCC 40 MG IJ SOLR
40.0000 mg | Freq: Three times a day (TID) | INTRAMUSCULAR | Status: DC
  Administered 2019-05-14 – 2019-05-16 (×6): 40 mg via INTRAVENOUS
  Filled 2019-05-14 (×6): qty 1

## 2019-05-14 MED ORDER — JUVEN PO PACK
1.0000 | PACK | Freq: Two times a day (BID) | ORAL | Status: DC
  Administered 2019-05-14 – 2019-05-16 (×5): 1 via ORAL
  Filled 2019-05-14 (×4): qty 1

## 2019-05-14 MED ORDER — DILTIAZEM HCL 30 MG PO TABS
30.0000 mg | ORAL_TABLET | Freq: Once | ORAL | Status: AC
  Administered 2019-05-14: 14:00:00 30 mg via ORAL
  Filled 2019-05-14: qty 1

## 2019-05-14 MED ORDER — LEVALBUTEROL HCL 0.63 MG/3ML IN NEBU
0.6300 mg | INHALATION_SOLUTION | Freq: Three times a day (TID) | RESPIRATORY_TRACT | Status: DC
  Administered 2019-05-15 – 2019-05-16 (×5): 0.63 mg via RESPIRATORY_TRACT
  Filled 2019-05-14 (×5): qty 3

## 2019-05-14 MED ORDER — ALBUTEROL SULFATE (2.5 MG/3ML) 0.083% IN NEBU: 2.5000 mg | INHALATION_SOLUTION | Freq: Four times a day (QID) | RESPIRATORY_TRACT | Status: DC | PRN

## 2019-05-14 MED ORDER — DILTIAZEM HCL ER COATED BEADS 240 MG PO CP24
240.0000 mg | ORAL_CAPSULE | Freq: Every day | ORAL | Status: DC
  Administered 2019-05-15 – 2019-05-16 (×2): 240 mg via ORAL
  Filled 2019-05-14 (×2): qty 1

## 2019-05-14 NOTE — Progress Notes (Signed)
Initial Nutrition Assessment   INTERVENTION:  -1 packet Juven BID,  to support wound healing    NUTRITION DIAGNOSIS:   Increased nutrient needs related to wound healing as evidenced by estimated needs.   GOAL:  Patient will meet greater than or equal to 90% of their needs   MONITOR: skin assessments, PO intake, labs     REASON FOR ASSESSMENT:   Consult Assessment of nutrition requirement/status  ASSESSMENT: Patient is a 79 yo female with hx of dementia, diabetes type 2, Cirrhosis, COPD with acute exacerbation. Smoker (2 packs daily). Lives with her granddaughter. SNF recommended at discharge.  Patient up in chair and just finishing her lunch- 100% consumed. Poor dentition noted. Feeds herself. Endorses good appetite and reports to consume 3 meals and 2 snacks daily at home.    Review of weight history shows gain form 74.8 kg early January to current 80.7 kg. (7.8% in 6 weeks). Patient says prior to admission she was having a problem with "feet swelling".  Intake/Output Summary (Last 24 hours) at 05/14/2019 1417 Last data filed at 05/14/2019 1300 Gross per 24 hour  Intake 793 ml  Output 525 ml  Net 268 ml    Medications: Pepcid, Lasix, Lovenox, Novolog/Lantus, Lactulose, Prednisone, Remeron and Nicoderm.   Labs: Glucose 172 mg/dl CBG's: 145-228 mg/dl A1C-5.5%- well controlled   NUTRITION - FOCUSED PHYSICAL EXAM: Nutrition-Focused physical exam: no fat depletion, and no edema.     Diet Order:   Diet Order            Diet Carb Modified Fluid consistency: Thin; Room service appropriate? Yes  Diet effective now              EDUCATION NEEDS:  Not appropriate for education at this time Skin:  Skin Assessment: Skin Integrity Issues: Skin Integrity Issues:: Stage II Stage II: sacrum  Last BM:  unknown  Height:   Ht Readings from Last 1 Encounters:  05/14/19 5\' 5"  (1.651 m)    Weight:   Wt Readings from Last 1 Encounters:  05/14/19 80.7 kg    Ideal  Body Weight:   62 kg  BMI:  Body mass index is 29.61 kg/m.  Estimated Nutritional Needs:   Kcal:  1550-1860  Protein:  80-87 gr  Fluid:  >1500 ml daily  Jeani Hawking RD Clinical Nutrition Contact: #AMION

## 2019-05-14 NOTE — Plan of Care (Signed)
  Problem: Acute Rehab OT Goals (only OT should resolve) Goal: Pt. Will Perform Upper Body Bathing Flowsheets (Taken 05/14/2019 1018) Pt Will Perform Upper Body Bathing:  with set-up  sitting Goal: Pt. Will Perform Lower Body Bathing Flowsheets (Taken 05/14/2019 1018) Pt Will Perform Lower Body Bathing:  with min assist  sit to/from stand  sitting/lateral leans Goal: Pt. Will Perform Lower Body Dressing Flowsheets (Taken 05/14/2019 1018) Pt Will Perform Lower Body Dressing:  with mod assist  sit to/from stand  sitting/lateral leans Goal: Pt. Will Perform Toileting-Clothing Manipulation Flowsheets (Taken 05/14/2019 1018) Pt Will Perform Toileting - Clothing Manipulation and hygiene:  with max assist  sit to/from stand  sitting/lateral leans Goal: OT Additional ADL Goal #1 Flowsheets (Taken 05/14/2019 1018) Additional ADL Goal #1: Patient will transfer to recliner from bed at supervision with use of RW.

## 2019-05-14 NOTE — Progress Notes (Signed)
Bodenheimer, NP notified of heart rhythm atrial fib/flutter with BBB with rate approx 100.

## 2019-05-14 NOTE — Plan of Care (Signed)
  Problem: Acute Rehab PT Goals(only PT should resolve) Goal: Pt Will Go Supine/Side To Sit Outcome: Progressing Flowsheets (Taken 05/14/2019 1234) Pt will go Supine/Side to Sit: with supervision Goal: Patient Will Transfer Sit To/From Stand Outcome: Progressing Flowsheets (Taken 05/14/2019 1234) Patient will transfer sit to/from stand: with min guard assist Goal: Pt Will Transfer Bed To Chair/Chair To Bed Outcome: Progressing Flowsheets (Taken 05/14/2019 1234) Pt will Transfer Bed to Chair/Chair to Bed: min guard assist Goal: Pt Will Ambulate Outcome: Progressing Flowsheets (Taken 05/14/2019 1234) Pt will Ambulate:  25 feet  with minimal assist  with rolling walker   12:35 PM, 05/14/19 Lonell Grandchild, MPT Physical Therapist with [facility] [phone] office [phone] mobile phone

## 2019-05-14 NOTE — Progress Notes (Signed)
Patient Demographics:    Heather Newman, is a 79 y.o. female, DOB - [birth year], [id number]  Admit date - 05/12/2019   Admitting Physician Shantai Tiedeman Denton Brick, MD  Outpatient Primary MD for the patient is [name], NP  LOS - 2  Chief Complaint  Patient presents with  . Shortness of Breath        Subjective:    [name] today has no fevers, no emesis,  No chest pain,   Sinus tach persist Cough and shortness of breath improving -Out of bed with physical therapy  Assessment  & Plan :    Principal Problem:   Acute respiratory failure with hypoxia and hypercapnia (HCC) Active Problems:   Obstructive sleep apnea (adult) (pediatric)   Chronic systolic (congestive) heart failure (HCC)   Dementia in other diseases classified elsewhere without behavioral disturbance (HCC)   Acute metabolic encephalopathy   Unspecified atrial fibrillation (HCC)   Other cirrhosis of liver (HCC)   Diabetes mellitus type 2 with complications (HCC)   COPD with acute exacerbation (HCC)   Pressure injury of skin  Brief Summary: 79 y.o. female smoker with a history of dementia, DM2, atrial fibrillation (not on AC due to falls), COPD, systolic heart failure, HTN, cirrhosis, OSA admitted with hypoxic and hypercapnic respiratory failure on 05/12/2019  A/p 1) acute hypoxic and hypercapnic respiratory failure----apparently patient has been using her granddaughter's oxygen at home--from time to time -So she may actually have acute on chronic respiratory failure -ABG despite BiPAP continues to show hypercapnic and hypoxic respiratory failure -Auscultation of the lungs on imaging of the chest failed to demonstrate definite evidence of pneumonia --Continue IV Solu-Medrol, bronchodilators and mucolytics, and doxycycline -Overall improving from a respiratory standpoint with reduced oxygen requirement -May use BiPAP  nightly  2) acute COPD exacerbation--- leading to #1 above, treat as above #1  3)PAFib--- sinus tach noted, anticipate persistent tachycardia in the setting of bronchodilator use, -Increase Cardizem CD 240 mg daily (was on 120mg  PTA) -Stop albuterol and switch to levalbuterol due to tachycardia  4)DM2-A1c 5.5 reflecting excellent diabetic control PTA -Anticipate worsening glycemic control due to steroids  5)Social/Ethics--- recently moved from SNF facility in [place] -Patient is a full code -MCV is elevated, query if alcohol use -PT recommends SNF rehab  6)H/o OSA--???  If compliance with CPAP or even if she has CPAP at home  7)Tobacco Abuse--- 2 pack-a-day smoker, continue nicotine patch  8) acute metabolic encephalopathy superimposed on underlying dementia --Worsening mentation most likely due to hypercapnia and respiratory failure as well as mild hepatic encephalopathy  9) history of liver cirrhosis with mild hepatic encephalopathy--serum ammonia is borderline high at 41, continue lactulose as ordered --MCV is elevated, query if alcohol use -Platelets are normal  10) generalized weakness and deconditioning----we will get PT eval when respiratory status improves, may need SNF rehab   Disposition/Need for in-Hospital Stay- patient unable to be discharged at this time due to --significant respiratory decompensation which hypoxia and hypercapnia requiring BiPAP use, and IV steroids -Not medically ready for discharge due to respiratory status, PT recommends SNF rehab  Code Status : Full  Family Communication:   -Attempted to reach her granddaughter, not successful today -  RN spoke with Ms Meda Coffee (friend) Attempted to reach her granddaughter at [phone], not successful today   Consults  :  na  DVT Prophylaxis  :  Lovenox - - SCDs   Lab Results  Component Value Date   PLT 187 05/14/2019    Inpatient  Medications  Scheduled Meds: . aspirin EC  81 mg Oral q morning - 10a  . Chlorhexidine Gluconate Cloth  6 each Topical Daily  . [START ON 05/15/2019] diltiazem  240 mg Oral Daily  . doxycycline  100 mg Oral Q12H  . enoxaparin (LOVENOX) injection  40 mg Subcutaneous Q24H  . famotidine  20 mg Oral BID  . furosemide  20 mg Oral q morning - 10a  . gabapentin  400 mg Oral BID  . insulin aspart  0-20 Units Subcutaneous TID WC  . insulin aspart  0-5 Units Subcutaneous QHS  . insulin glargine  10 Units Subcutaneous QHS  . lactulose  20 g Oral BID  . levalbuterol  0.63 mg Nebulization TID  . methylPREDNISolone (SOLU-MEDROL) injection  40 mg Intravenous Q8H  . mirtazapine  15 mg Oral QHS  . mometasone-formoterol  2 puff Inhalation Daily  . nicotine  21 mg Transdermal Daily  . nutrition supplement (JUVEN)  1 packet Oral BID BM  . pravastatin  10 mg Oral QHS  . sodium chloride flush  3 mL Intravenous Q12H   Continuous Infusions: . sodium chloride     PRN Meds:.sodium chloride, albuterol, bisacodyl, methocarbamol, sodium chloride, sodium chloride flush    Anti-infectives (From admission, onward)   Start     Dose/Rate Route Frequency Ordered Stop   05/13/19 2200  doxycycline (VIBRA-TABS) tablet 100 mg     100 mg Oral Every 12 hours 05/13/19 1726          Objective:   Vitals:   05/14/19 1520 05/14/19 1547 05/14/19 1600 05/14/19 1800  BP:   107/90 115/63  Pulse:  92 87 (!) 119  Resp: 19  (!) 32 (!) 27  Temp:  97.7 F (36.5 C)    TempSrc:  Oral    SpO2: 94%  91% 92%  Weight:      Height:        Wt Readings from Last 3 Encounters:  05/14/19 80.7 kg  04/17/19 78.9 kg  04/03/19 74.8 kg     Intake/Output Summary (Last 24 hours) at 05/14/2019 2023 Last data filed at 05/14/2019 1300 Gross per 24 hour  Intake 543 ml  Output --  Net 543 ml     Physical Exam  Gen:- Awake Alert, unable to speak in sentences HEENT:- [place].AT, No sclera icterus Nose- [place] 2L/min,  Neck-Supple  Neck,No JVD,.  Lungs-improving air movement, few scattered wheezes CV- S1, S2 normal, regular , tachy Abd-  +ve B.Sounds, Abd Soft, No tenderness,    Extremity/Skin:- No  edema, pedal pulses present  Psych-confusional episodes, some cognitive and memory deficits  neuro-generalized weakness no new focal deficits,+ve tremors   Data Review:   Micro Results Recent Results (from the past 240 hour(s))  Respiratory Panel by RT PCR (Flu A&B, Covid) - Nasopharyngeal Swab     Status: None   Collection Time: 05/12/19  7:35 PM   Specimen: Nasopharyngeal Swab  Result Value Ref Range Status   SARS Coronavirus 2 by RT PCR NEGATIVE NEGATIVE Final    Comment: (NOTE) SARS-CoV-2 target nucleic acids are NOT DETECTED. The SARS-CoV-2 RNA is generally detectable in upper respiratoy specimens during the acute phase of infection.  The lowest concentration of SARS-CoV-2 viral copies this assay can detect is 131 copies/mL. A negative result does not preclude SARS-Cov-2 infection and should not be used as the sole basis for treatment or other patient management decisions. A negative result may occur with  improper specimen collection/handling, submission of specimen other than nasopharyngeal swab, presence of viral mutation(s) within the areas targeted by this assay, and inadequate number of viral copies (<131 copies/mL). A negative result must be combined with clinical observations, patient history, and epidemiological information. The expected result is Negative. Fact Sheet for Patients:  PinkCheek.be Fact Sheet for Healthcare Providers:  GravelBags.it This test is not yet ap proved or cleared by the [place] FDA and  has been authorized for detection and/or diagnosis of SARS-CoV-2 by FDA under an Emergency Use Authorization (EUA). This EUA will remain  in effect (meaning this test can be used) for the duration of the COVID-19 declaration  under Section 564(b)(1) of the Act, 21 U.S.C. section 360bbb-3(b)(1), unless the authorization is terminated or revoked sooner.    Influenza A by PCR NEGATIVE NEGATIVE Final   Influenza B by PCR NEGATIVE NEGATIVE Final    Comment: (NOTE) The Xpert Xpress SARS-CoV-2/FLU/RSV assay is intended as an aid in  the diagnosis of influenza from Nasopharyngeal swab specimens and  should not be used as a sole basis for treatment. Nasal washings and  aspirates are unacceptable for Xpert Xpress SARS-CoV-2/FLU/RSV  testing. Fact Sheet for Patients: PinkCheek.be Fact Sheet for Healthcare Providers: GravelBags.it This test is not yet approved or cleared by the [place] FDA and  has been authorized for detection and/or diagnosis of SARS-CoV-2 by  FDA under an Emergency Use Authorization (EUA). This EUA will remain  in effect (meaning this test can be used) for the duration of the  Covid-19 declaration under Section 564(b)(1) of the Act, 21  U.S.C. section 360bbb-3(b)(1), unless the authorization is  terminated or revoked. Performed at [facility], [address] [place], [place] [address]   MRSA PCR Screening     Status: None   Collection Time: 05/13/19 10:30 AM   Specimen: Nasal Mucosa; Nasopharyngeal  Result Value Ref Range Status   MRSA by PCR NEGATIVE NEGATIVE Final    Comment:        The GeneXpert MRSA Assay (FDA approved for NASAL specimens only), is one component of a comprehensive MRSA colonization surveillance program. It is not intended to diagnose MRSA infection nor to guide or monitor treatment for MRSA infections. Performed at [facility], [address], [place], [place] [address]     Radiology Reports DG Chest [place] 1 View  Result Date: 05/12/2019 CLINICAL DATA:  Shortness of breath. EXAM: PORTABLE CHEST 1 VIEW COMPARISON:  None. FINDINGS: Tortuous thoracic aorta. Heart size appears mildly enlarged. There are  diffuse bilateral coarse interstitial opacities which may reflect chronic bronchitic change and scarring. No definite focal infiltrate. No pneumothorax or large pleural effusion. No acute finding in the visualized skeleton. IMPRESSION: Diffuse bilateral coarse interstitial opacities which may reflect chronic bronchitic change and scarring. No definite active disease. Electronically Signed   By: Audie Pinto M.D.   On: 05/12/2019 20:03     CBC Recent Labs  Lab 05/12/19 1935 05/13/19 0258 05/14/19 0411  WBC 5.9 5.0 5.5  HGB 12.5 12.9 12.2  HCT 43.1 45.0 40.7  PLT 192 192 187  MCV 106.4* 106.1* 103.6*  MCH 30.9 30.4 31.0  MCHC 29.0* 28.7* 30.0  RDW 18.2* 17.9* 18.6*  LYMPHSABS 0.8  --   --  MONOABS 1.0  --   --   EOSABS 0.1  --   --   BASOSABS 0.1  --   --     Chemistries  Recent Labs  Lab 05/12/19 1935 05/13/19 0258 05/14/19 0411  NA 141 142 141  K 3.8 4.0 4.1  CL 96* 101 98  CO2 37* 34* 34*  GLUCOSE 179* 175* 172*  BUN 9 11 23   CREATININE 0.52 0.52 0.68  CALCIUM 8.7* 8.5* 8.6*  AST 23  --   --   ALT 20  --   --   ALKPHOS 115  --   --   BILITOT 0.7  --   --    ------------------------------------------------------------------------------------------------------------------ No results for input(s): CHOL, HDL, LDLCALC, TRIG, CHOLHDL, LDLDIRECT in the last 72 hours.  Lab Results  Component Value Date   HGBA1C 5.5 05/12/2019   ------------------------------------------------------------------------------------------------------------------ No results for input(s): TSH, T4TOTAL, T3FREE, THYROIDAB in the last 72 hours.  Invalid input(s): FREET3 ------------------------------------------------------------------------------------------------------------------ No results for input(s): VITAMINB12, FOLATE, FERRITIN, TIBC, IRON, RETICCTPCT in the last 72 hours.  Coagulation profile No results for input(s): INR, PROTIME in the last 168 hours.  Recent Labs     05/13/19 0258  DDIMER 0.72*    Cardiac Enzymes No results for input(s): CKMB, TROPONINI, MYOGLOBIN in the last 168 hours.  Invalid input(s): CK ------------------------------------------------------------------------------------------------------------------    Component Value Date/Time   BNP 696.0 (H) 05/13/2019 [id number]    Roxan Hockey M.D on 05/14/2019 at 8:23 PM  Go to [email or web address] - for contact info  [facility] - Office  [phone]

## 2019-05-14 NOTE — Progress Notes (Signed)
Pt removed from Bipap and placed on RA. SPO2 92%.

## 2019-05-14 NOTE — TOC Progression Note (Signed)
Transition of Care [facility]) - Progression Note    Patient Details  Name: Heather Newman MRN: [id number] Date of Birth: [birth year]  [facility] CM/SW Contact  Boneta Lucks, RN Phone Number: 05/14/2019, 11:45 AM  Clinical Narrative:   [name] signed, PT and OT recommending SNF. Sending out today. Uploaded notes to PASSR as requested.     Expected Discharge Plan: Skilled Nursing Facility Barriers to Discharge: Continued Medical Work up  Expected Discharge Plan and Services Expected Discharge Plan: [place] Choice: Durable Medical Equipment Living arrangements for the past 2 months: [place]

## 2019-05-14 NOTE — Consult Note (Signed)
WOC Nurse Consult Note: Patient receiving care in [facility].  Consult completed remotely after review of record, image of area unavailable at this time. Reason for Consult: sacral ulcer Wound type: listed as a stage 2 on the flowsheet Pressure Injury POA: Yes Measurement: see flowsheet documentation Wound bed: see documentation for wound specifics Drainage (amount, consistency, odor)  Periwound: Dressing procedure/placement/frequency: I initiated the Skin Care Order set that any licensed nurse can begin for this type of wound.  Use of a foam dressing and a 3 step skin cleansing program has been initiated. Monitor the wound area(s) for worsening of condition such as: Signs/symptoms of infection,  Increase in size,  Development of or worsening of odor, Development of pain, or increased pain at the affected locations.  Notify the medical team if any of these develop.  Thank you for the consult. [place] nurse will not follow at this time.  Please re-consult the [facility] team if needed.  Val Riles, RN, MSN, CWOCN, CNS-BC, pager [phone]

## 2019-05-14 NOTE — Evaluation (Addendum)
Physical Therapy Evaluation Patient Details Name: Heather Newman MRN: [id number] DOB: 1940/06/21 Today's Date: 05/14/2019   History of Present Illness  Heather Newman is a 79 y.o. female with a history of dementia, DM2, atrial fibrillation (not on medications due to falls), COPD, systolic heart failure, HTN, cirrhosis, OSA.  Patient was in a nursing home in [place] and was recently taken out of the nursing home and brought to [place] by the patient's granddaughter.  She currently lives with her granddaughter and a caretaker.  As the patient is acutely encephalopathic, history was obtained from the caretaker after verbal permission from patient's granddaughter.  Patient has been getting increasingly short of breath over the past couple of days, but acutely worsened today they have been trying to give her albuterol inhalers, although this has not been completely accessible.  Due to increasing auditory hallucinations, EMS was called and they noted that she was extremely hypoxic to the 70s but responded adequately to a nonrebreather. They brought her to the emergency room for evaluation.    Clinical Impression  Patient demonstrates slow labored movement having to use bed rail for sitting up at bedside, at risk for falls and limited to a few steps at bedside due to c/o fatigue/poor standing balance, unable to hold onto handles of RW due to bilateral hand grip weakness, has to lean on RW with her elbows for support.  Patient tolerated sitting up in chair after therapy - nursing staff aware.  Patient's SpO2 at 95% throughout visit while on room air.  Patient will benefit from continued physical therapy in hospital and recommended venue below to increase strength, balance, endurance for safe ADLs and gait.     Follow Up Recommendations SNF    Equipment Recommendations  None recommended by PT    Recommendations for Other Services       Precautions / Restrictions Precautions Precautions:  Fall Precaution Comments: Watch HR and Oxygen during treatment sessions. Oxygen level drops during activities. HR increased when seated on EOB and on BSC. Restrictions Weight Bearing Restrictions: No      Mobility  Bed Mobility Overal bed mobility: Needs Assistance Bed Mobility: Supine to Sit;Sit to Supine     Supine to sit: Min assist;HOB elevated Sit to supine: Min assist   General bed mobility comments: increased time, labored movement, has to use bed rail  Transfers Overall transfer level: Needs assistance Equipment used: Rolling walker (2 wheeled) Transfers: Sit to/from [facility] Sit to Stand: Min assist Stand pivot transfers: Min assist Squat pivot transfers: Supervision;From elevated surface     General transfer comment: slow labored movement  Ambulation/Gait Ambulation/Gait assistance: Mod assist Gait Distance (Feet): 8 Feet Assistive device: Rolling walker (2 wheeled) Gait Pattern/deviations: Decreased step length - right;Decreased step length - left;Decreased stride length Gait velocity: decreased   General Gait Details: limited 7-8 slow labored steps at bedside, has to lean on RW with elbows due to bilateral weakness in hands  Stairs            Wheelchair Mobility    Modified Rankin (Stroke Patients Only)       Balance Overall balance assessment: Needs assistance Sitting-balance support: Feet supported;No upper extremity supported Sitting balance-Leahy Scale: Fair Sitting balance - Comments: fair/good seated at EOB   Standing balance support: During functional activity;Bilateral upper extremity supported Standing balance-Leahy Scale: Poor Standing balance comment: fair/poor using RW  Pertinent Vitals/Pain Pain Assessment: Faces Faces Pain Scale: Hurts a little bit Pain Location: bilateral feet legs due to swelling Pain Descriptors / Indicators: Sore;Discomfort Pain Intervention(s):  Limited activity within patient's tolerance;Monitored during session    Home Living Family/patient expects to be discharged to:: Private residence Living Arrangements: Other relatives;Non-relatives/Friends Available Help at Discharge: Family Type of Home: House Home Access: Ramped entrance     Home Layout: One level Home Equipment: Bedside commode;Transport chair;Walker - 4 wheels;Hospital bed;Adaptive equipment Additional Comments: Patient lives with granddaughter and granddaughter's friend who is assiting as caregiver.    Prior Function Level of Independence: Needs assistance   Gait / Transfers Assistance Needed: Pt reports limited ambulation which was dependent on her legs (swelling, heaviness). Mainly used wheelchair although was able to ambulate short distances with 2 wheeled walker and family.  ADL's / Homemaking Assistance Needed: Patient reports she was able to dress herself. She sponge bathes as she is unable to get into the tub. No walk-in shower or shower chair available.        Hand Dominance   Dominant Hand: Right    Extremity/Trunk Assessment   Upper Extremity Assessment Upper Extremity Assessment: Defer to OT evaluation    Lower Extremity Assessment Lower Extremity Assessment: Generalized weakness    Cervical / Trunk Assessment Cervical / Trunk Assessment: Kyphotic  Communication   Communication: No difficulties  Cognition Arousal/Alertness: Awake/alert Behavior During Therapy: WFL for tasks assessed/performed Overall Cognitive Status: Within Functional Limits for tasks assessed                                        General Comments      Exercises     Assessment/Plan    PT Assessment Patient needs continued PT services  PT Problem List Decreased strength;Decreased activity tolerance;Decreased balance;Decreased mobility       PT Treatment Interventions Gait training;Functional mobility training;Therapeutic  activities;Therapeutic exercise;Patient/family education    PT Goals (Current goals can be found in the Care Plan section)  Acute Rehab PT Goals Patient Stated Goal: return home with family to assist PT Goal Formulation: With patient Time For Goal Achievement: 05/28/19 Potential to Achieve Goals: Good    Frequency Min 3X/week   Barriers to discharge        Co-evaluation               AM-PAC PT "6 Clicks" Mobility  Outcome Measure Help needed turning from your back to your side while in a flat bed without using bedrails?: A Little Help needed moving from lying on your back to sitting on the side of a flat bed without using bedrails?: A Little Help needed moving to and from a bed to a chair (including a wheelchair)?: A Little Help needed standing up from a chair using your arms (e.g., wheelchair or bedside chair)?: A Little Help needed to walk in hospital room?: A Lot Help needed climbing 3-5 steps with a railing? : A Lot 6 Click Score: 16    End of Session   Activity Tolerance: Patient tolerated treatment well;Patient limited by fatigue Patient left: in chair;with call bell/phone within reach Nurse Communication: Mobility status PT Visit Diagnosis: Unsteadiness on feet (R26.81);Other abnormalities of gait and mobility (R26.89);Muscle weakness (generalized) (M62.81)    Time: [id number] PT Time Calculation (min) (ACUTE ONLY): 31 min   Charges:   PT Evaluation $PT Eval Moderate Complexity: 1 Mod  12:31 PM, 05/14/19 Lonell Grandchild, MPT Physical Therapist with [facility] [phone] office [phone] mobile phone

## 2019-05-14 NOTE — Evaluation (Signed)
Occupational Therapy Evaluation Patient Details Name: Heather Newman MRN: [id number] DOB: [birth year] Today's Date: 05/14/2019    History of Present Illness Heather Newman is a 79 y.o. female with a history of dementia, DM2, atrial fibrillation (not on medications due to falls), COPD, systolic heart failure, HTN, cirrhosis, OSA.  Patient was in a nursing home in [place] and was recently taken out of the nursing home and brought to [place] by the patient's granddaughter.  She currently lives with her granddaughter and a caretaker.  As the patient is acutely encephalopathic, history was obtained from the caretaker after verbal permission from patient's granddaughter.  Patient has been getting increasingly short of breath over the past couple of days, but acutely worsened today they have been trying to give her albuterol inhalers, although this has not been completely accessible.  Due to increasing auditory hallucinations, EMS was called and they noted that she was extremely hypoxic to the 70s but responded adequately to a nonrebreather. They brought her to the emergency room for evaluation.   Clinical Impression   Patient finishing breakfast upon therapy arrival and agreeable to participate in OT evaluation. Patient not on any supplemental oxygen when therapist arrived. During evaluation, oxygen saturation decreased and fluctuated with any type of activity. Patient was placed on patient (2L) short term to increase saturation.  When seated on edge of bed, HR increased to 128-130 when seated at rest and during any seated activity. Patient reported no feeling of her heart racing. At this time, patient is demonstrating during activity tolerance and overall endurance resulting in difficulty completing daily tasks without assistance. As family has voiced that they are unable to provide the needed amount of care, patient would benefit from discharge to SNF prior to returning home.      Follow Up  Recommendations  SNF    Equipment Recommendations  Other (comment)(TBD)       Precautions / Restrictions Precautions Precautions: Other (comment);Fall Precaution Comments: Watch HR and Oxygen during treatment sessions. Oxygen level drops during activities. HR increased when seated on EOB and on BSC. Restrictions Weight Bearing Restrictions: No      Mobility Bed Mobility Overal bed mobility: Needs Assistance Bed Mobility: Supine to Sit;Sit to Supine     Supine to sit: Min assist;HOB elevated Sit to supine: Min assist      Transfers Overall transfer level: Needs assistance Equipment used: None Transfers: Squat Pivot Transfers     Squat pivot transfers: Supervision;From elevated surface          Balance Overall balance assessment: Mild deficits observed, not formally tested;History of Falls        ADL either performed or assessed with clinical judgement   ADL Overall ADL's : Needs assistance/impaired Eating/Feeding: Set up;Bed level   Grooming: Wash/dry face;Wash/dry hands;Brushing hair;Set up;Sitting               Lower Body Dressing: Total assistance;Bed level Lower Body Dressing Details (indicate cue type and reason): with hospital socks donning/doffing Toilet Transfer: Supervision/safety;BSC;Squat-pivot   Toileting- Clothing Manipulation and Hygiene: Total assistance;Sit to/from stand Toileting - Clothing Manipulation Details (indicate cue type and reason): Pt was able to use BUE to push self up and forward off the commode for clean up             Vision Baseline Vision/History: No visual deficits Patient Visual Report: No change from baseline           Hand Dominance Right   Extremity/Trunk Assessment Upper Extremity Assessment  Upper Extremity Assessment: Overall WFL for tasks assessed   Lower Extremity Assessment Lower Extremity Assessment: Defer to PT evaluation       Communication Communication Communication: No  difficulties   Cognition Arousal/Alertness: Awake/alert Behavior During Therapy: WFL for tasks assessed/performed Overall Cognitive Status: History of cognitive impairments - at baseline                    Home Living Family/patient expects to be discharged to:: Skilled nursing facility Living Arrangements: Other relatives;Non-relatives/Friends       Home Equipment: Wheelchair - [employer] - 2 wheels;Adaptive equipment;Bedside commode Adaptive Equipment: [place] Additional Comments: Patient lives with granddaughter and granddaughter's friend who is assiting as caregiver.      Prior Functioning/Environment Level of Independence: Needs assistance  Gait / Transfers Assistance Needed: Pt reports limited ambulation which was dependent on her legs (swelling, heaviness). Mainly used wheelchair although was able to ambulate short distances with 2 wheeled walker and family. ADL's / Homemaking Assistance Needed: Patient reports she was able to dress herself. She sponge bathes as she is unable to get into the tub. No walk-in shower or shower chair available.            OT Problem List: Decreased strength;Impaired balance (sitting and/or standing);Decreased activity tolerance      OT Treatment/Interventions: Self-care/ADL training;Therapeutic exercise;Therapeutic activities;DME and/or AE instruction;Modalities;Balance training;Patient/family education;Energy conservation    OT Goals(Current goals can be found in the care plan section) Acute Rehab OT Goals Patient Stated Goal: none stated OT Goal Formulation: With patient Time For Goal Achievement: 05/28/19 Potential to Achieve Goals: Good  OT Frequency: Min 2X/week   Barriers to D/C: Decreased caregiver support(per chart, granddaugher is unable to care for patient)             AM-PAC OT "6 Clicks" Daily Activity     Outcome Measure Help from another person eating meals?: None Help from another person taking care of  personal grooming?: A Little Help from another person toileting, which includes using toliet, bedpan, or urinal?: A Lot Help from another person bathing (including washing, rinsing, drying)?: A Lot Help from another person to put on and taking off regular upper body clothing?: A Little Help from another person to put on and taking off regular lower body clothing?: Total 6 Click Score: 15   End of Session Equipment Utilized During Treatment: Oxygen(oxygen used periodically during session as needed.) Nurse Communication: Mobility status  Activity Tolerance: Patient tolerated treatment well;Treatment limited secondary to medical complications (Comment);Other (comment)(Eleveated HR) Patient left: in bed;with call bell/phone within reach;with bed alarm set  OT Visit Diagnosis: History of falling (Z91.81)                Time: [id number] OT Time Calculation (min): 42 min Charges:  OT General Charges $OT Visit: 1 Visit OT Evaluation $OT Eval Low Complexity: [place], OTR/L,CBIS  [phone]   Isair Inabinet, Clarene Duke 05/14/2019, 10:07 AM

## 2019-05-15 ENCOUNTER — Other Ambulatory Visit: Payer: Self-pay

## 2019-05-15 ENCOUNTER — Encounter (HOSPITAL_COMMUNITY): Payer: Self-pay | Admitting: Family Medicine

## 2019-05-15 DIAGNOSIS — J439 Emphysema, unspecified: Secondary | ICD-10-CM

## 2019-05-15 DIAGNOSIS — Z8679 Personal history of other diseases of the circulatory system: Secondary | ICD-10-CM

## 2019-05-15 DIAGNOSIS — I483 Typical atrial flutter: Secondary | ICD-10-CM

## 2019-05-15 LAB — GLUCOSE, CAPILLARY
Glucose-Capillary: 154 mg/dL — ABNORMAL HIGH (ref 70–99)
Glucose-Capillary: 262 mg/dL — ABNORMAL HIGH (ref 70–99)
Glucose-Capillary: 159 mg/dL — ABNORMAL HIGH (ref 70–99)
Glucose-Capillary: 262 mg/dL — ABNORMAL HIGH (ref 70–99)

## 2019-05-15 MED ORDER — DILTIAZEM HCL 25 MG/5ML IV SOLN
10.0000 mg | Freq: Once | INTRAVENOUS | Status: AC
  Administered 2019-05-15: 10 mg via INTRAVENOUS
  Filled 2019-05-15: qty 5

## 2019-05-15 MED ORDER — ENOXAPARIN SODIUM 80 MG/0.8ML ~~LOC~~ SOLN: 80.0000 mg | Freq: Two times a day (BID) | SUBCUTANEOUS | Status: DC

## 2019-05-15 MED ORDER — FLUTICASONE FUROATE-VILANTEROL 100-25 MCG/INH IN AEPB: 1.0000 | INHALATION_SPRAY | Freq: Two times a day (BID) | RESPIRATORY_TRACT | 2 refills | Status: DC

## 2019-05-15 MED ORDER — METOPROLOL TARTRATE 25 MG PO TABS
25.0000 mg | ORAL_TABLET | Freq: Three times a day (TID) | ORAL | Status: DC
  Administered 2019-05-15 – 2019-05-16 (×4): 25 mg via ORAL
  Filled 2019-05-15 (×4): qty 1

## 2019-05-15 MED ORDER — METOPROLOL TARTRATE 5 MG/5ML IV SOLN
5.0000 mg | Freq: Once | INTRAVENOUS | Status: AC
  Administered 2019-05-15: 5 mg via INTRAVENOUS
  Filled 2019-05-15: qty 5

## 2019-05-15 MED ORDER — ENOXAPARIN SODIUM 40 MG/0.4ML ~~LOC~~ SOLN
40.0000 mg | SUBCUTANEOUS | Status: DC
  Administered 2019-05-16: 40 mg via SUBCUTANEOUS
  Filled 2019-05-15 (×2): qty 0.4

## 2019-05-15 MED ORDER — METOPROLOL TARTRATE 5 MG/5ML IV SOLN
2.5000 mg | Freq: Four times a day (QID) | INTRAVENOUS | Status: DC | PRN
  Administered 2019-05-15: 2.5 mg via INTRAVENOUS
  Filled 2019-05-15: qty 5

## 2019-05-15 NOTE — Progress Notes (Signed)
Patient Demographics:    Heather Newman, is a 79 y.o. female, DOB - 03-29-1941, [id number]  Admit date - 05/12/2019   Admitting Physician Sehaj Mcenroe Denton Brick, MD  Outpatient Primary MD for the patient is [name], NP  LOS - 3  Chief Complaint  Patient presents with  . Shortness of Breath        Subjective:    Heather Newman today has no fevers, no emesis,  No chest pain,   Tachy overnight requiring metoprolol in addition to Cardizem -Shortness of breath and hypoxia improving  Assessment  & Plan :    Principal Problem:   Acute respiratory failure with hypoxia and hypercapnia (HCC) Active Problems:   Obstructive sleep apnea (adult) (pediatric)   Chronic systolic (congestive) heart failure (HCC)   Dementia in other diseases classified elsewhere without behavioral disturbance (HCC)   Acute metabolic encephalopathy   Unspecified atrial fibrillation (HCC)   Other cirrhosis of liver (HCC)   Diabetes mellitus type 2 with complications (HCC)   COPD with acute exacerbation (HCC)   Pressure injury of skin   Brief Summary: 79 y.o. female smoker with a history of dementia, DM2, atrial fibrillation (not on AC due to falls), COPD, systolic heart failure, HTN, cirrhosis, OSA admitted with hypoxic and hypercapnic respiratory failure on 05/12/2019  A/p 1) acute hypoxic and hypercapnic respiratory failure----apparently patient has been using her granddaughter's oxygen at home--from time to time -So she may actually have acute on chronic respiratory failure -ABG despite BiPAP continues to show hypercapnic and hypoxic respiratory failure -Auscultation of the lungs on imaging of the chest failed to demonstrate definite evidence of pneumonia --Continue IV Solu-Medrol, bronchodilators and mucolytics, and doxycycline -Overall improving from a respiratory standpoint with reduced oxygen requirement -May use  BiPAP nightly  2) acute COPD exacerbation--- leading to #1 above, treat as above #1  3)PAFib/atrial flutter --- s persistent atrial flutter noted, anticipate persistent tachycardia in the setting of bronchodilator use, -Increased Cardizem CD 240 mg daily (was on 120mg  PTA) -Stop albuterol and switch to levalbuterol due to tachycardia Discussed with Grand-daughter Glenetta Borg and Meda Coffee "Decker" MariJo at [phone] --- at this time they declined full anticoagulation due to concerns about bleeding risk and patient recurrent history of falls with hip fracture- Discussed with cardiology service okay to add metoprolol for better rate control  4)DM2-A1c 5.5 reflecting excellent diabetic control PTA -Anticipate worsening glycemic control due to steroids  5)Social/Ethics--- recently moved [place] SN[place] in [place] -Patient is a full code -MCV is elevated, patient and family deny EtOH use -PT recommends SNF rehab  6)H/o OSA--???  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  Also persistent tachycardia requiring IV metoprolol along with increasing/titrating doses  of Cardizem -Not medically ready for discharge due to respiratory status and need for further rate control PT recommends SNF rehab  Code Status : Full  Family Communication:   -Attempted to reach her granddaughter, not successful today -RN spoke with Ms Meda Coffee (friend) Attempted to reach her granddaughter at [phone], not successful today   Consults  :  na  DVT Prophylaxis  :  Lovenox - - SCDs   Lab Results  Component Value Date   PLT 187 05/14/2019    Inpatient Medications  Scheduled Meds: . aspirin EC  81 mg Oral q morning - 10a  . Chlorhexidine Gluconate Cloth  6 each Topical Daily  . diltiazem  240 mg Oral Daily  . doxycycline  100 mg Oral Q12H  . [START ON 05/16/2019] enoxaparin (LOVENOX) injection  40 mg Subcutaneous Q24H  . famotidine  20 mg Oral BID  . furosemide  20 mg Oral q morning - 10a  . gabapentin  400 mg Oral BID  . insulin aspart  0-20 Units Subcutaneous TID WC  . insulin aspart  0-5 Units Subcutaneous QHS  . insulin glargine  10 Units Subcutaneous QHS  . lactulose  20 g Oral BID  . levalbuterol  0.63 mg Nebulization TID  . methylPREDNISolone (SOLU-MEDROL) injection  40 mg Intravenous Q8H  . metoprolol tartrate  25 mg Oral Q8H  . mirtazapine  15 mg Oral QHS  . mometasone-formoterol  2 puff Inhalation Daily  . nicotine  21 mg Transdermal Daily  . nutrition supplement (JUVEN)  1 packet Oral BID BM  . pravastatin  10 mg Oral QHS  . sodium chloride flush  3 mL Intravenous Q12H   Continuous Infusions: . sodium chloride     PRN Meds:.sodium chloride, albuterol, bisacodyl, methocarbamol, metoprolol tartrate, sodium chloride, sodium chloride flush    Anti-infectives (From admission, onward)   Start     Dose/Rate Route Frequency Ordered Stop   05/13/19 2200  doxycycline (VIBRA-TABS) tablet 100 mg     100 mg Oral Every 12 hours 05/13/19 1726          Objective:   Vitals:   05/15/19 1600 05/15/19 1634 05/15/19 1700 05/15/19 1800  BP: (!) 122/99   112/65 112/65  Pulse: 67 86 (!) 58 74  Resp: (!) 24  18 17   Temp: 98.5 F (36.9 C)     TempSrc: Oral     SpO2: (!) 89%  95% 92%  Weight:      Height:        Wt Readings from Last 3 Encounters:  05/15/19 80.4 kg  04/17/19 78.9 kg  04/03/19 74.8 kg     Intake/Output Summary (Last 24 hours) at 05/15/2019 1907 Last data filed at 05/15/2019 1437 Gross per 24 hour  Intake -  Output 1450 ml  Net -1450 ml     Physical Exam  Gen:- Awake Alert, able to speak in sentences  HEENT:- [place].AT, No sclera icterus Nose-  2L/min,  Neck-Supple Neck,No JVD,.  Lungs-improving air movement, no significant wheezing CV- S1, S2 normal, irregular , tachy Abd-  +ve B.Sounds, Abd Soft, No tenderness,    Extremity/Skin:- No  edema, pedal pulses present  Psych--cooperative, some cognitive and memory deficits  neuro-generalized weakness no new focal deficits,+ve tremors   Data Review:   Micro Results Recent  Results (from the past 240 hour(s))  Respiratory Panel by RT PCR (Flu A&B, Covid) - Nasopharyngeal Swab     Status: None   Collection Time: 05/12/19  7:35 PM   Specimen: Nasopharyngeal Swab  Result Value Ref Range Status   SARS Coronavirus 2 by RT PCR NEGATIVE NEGATIVE Final    Comment: (NOTE) SARS-CoV-2 target nucleic acids are NOT DETECTED. The SARS-CoV-2 RNA is generally detectable in upper respiratoy specimens during the acute phase of infection. The lowest concentration of SARS-CoV-2 viral copies this assay can detect is 131 copies/mL. A negative result does not preclude SARS-Cov-2 infection and should not be used as the sole basis for treatment or other patient management decisions. A negative result may occur with  improper specimen collection/handling, submission of specimen other than nasopharyngeal swab, presence of viral mutation(s) within the areas targeted by this assay, and inadequate number of viral copies (<131 copies/mL). A negative result must be combined with clinical  observations, patient history, and epidemiological information. The expected result is Negative. Fact Sheet for Patients:  PinkCheek.be Fact Sheet for Healthcare Providers:  GravelBags.it This test is not yet ap proved or cleared by the [place] FDA and  has been authorized for detection and/or diagnosis of SARS-CoV-2 by FDA under an Emergency Use Authorization (EUA). This EUA will remain  in effect (meaning this test can be used) for the duration of the COVID-19 declaration under Section 564(b)(1) of the Act, 21 U.S.C. section 360bbb-3(b)(1), unless the authorization is terminated or revoked sooner.    Influenza A by PCR NEGATIVE NEGATIVE Final   Influenza B by PCR NEGATIVE NEGATIVE Final    Comment: (NOTE) The Xpert Xpress SARS-CoV-2/FLU/RSV assay is intended as an aid in  the diagnosis of influenza from Nasopharyngeal swab specimens and  should not be used as a sole basis for treatment. Nasal washings and  aspirates are unacceptable for Xpert Xpress SARS-CoV-2/FLU/RSV  testing. Fact Sheet for Patients: PinkCheek.be Fact Sheet for Healthcare Providers: GravelBags.it This test is not yet approved or cleared by the [place] FDA and  has been authorized for detection and/or diagnosis of SARS-CoV-2 by  FDA under an Emergency Use Authorization (EUA). This EUA will remain  in effect (meaning this test can be used) for the duration of the  Covid-19 declaration under Section 564(b)(1) of the Act, 21  U.S.C. section 360bbb-3(b)(1), unless the authorization is  terminated or revoked. Performed at [facility], [address] [place], [place] [address]   MRSA PCR Screening     Status: None   Collection Time: 05/13/19 10:30 AM   Specimen: Nasal Mucosa; Nasopharyngeal  Result Value Ref Range Status   MRSA by PCR NEGATIVE NEGATIVE Final    Comment:        The  GeneXpert MRSA Assay (FDA approved for NASAL specimens only), is one component of a comprehensive MRSA colonization surveillance program. It is not intended to diagnose MRSA infection nor to guide or monitor treatment for MRSA infections. Performed at [facility], [address], [place],  [address]     Radiology Reports DG Chest [place] 1 View  Result Date: 05/12/2019 CLINICAL DATA:  Shortness of breath. EXAM: PORTABLE CHEST 1 VIEW COMPARISON:  None. FINDINGS: Tortuous thoracic aorta. Heart size appears mildly enlarged. There are diffuse bilateral coarse interstitial opacities which may reflect chronic bronchitic change and scarring. No definite focal infiltrate. No pneumothorax or large pleural effusion. No acute finding in the visualized skeleton. IMPRESSION: Diffuse bilateral coarse interstitial opacities which may  reflect chronic bronchitic change and scarring. No definite active disease. Electronically Signed   By: Audie Pinto M.D.   On: 05/12/2019 20:03     CBC Recent Labs  Lab 05/12/19 1935 05/13/19 0258 05/14/19 0411  WBC 5.9 5.0 5.5  HGB 12.5 12.9 12.2  HCT 43.1 45.0 40.7  PLT 192 192 187  MCV 106.4* 106.1* 103.6*  MCH 30.9 30.4 31.0  MCHC 29.0* 28.7* 30.0  RDW 18.2* 17.9* 18.6*  LYMPHSABS 0.8  --   --   MONOABS 1.0  --   --   EOSABS 0.1  --   --   BASOSABS 0.1  --   --     Chemistries  Recent Labs  Lab 05/12/19 1935 05/13/19 0258 05/14/19 0411  NA 141 142 141  K 3.8 4.0 4.1  CL 96* 101 98  CO2 37* 34* 34*  GLUCOSE 179* 175* 172*  BUN 9 11 23   CREATININE 0.52 0.52 0.68  CALCIUM 8.7* 8.5* 8.6*  AST 23  --   --   ALT 20  --   --   ALKPHOS 115  --   --   BILITOT 0.7  --   --    ------------------------------------------------------------------------------------------------------------------ No results for input(s): CHOL, HDL, LDLCALC, TRIG, CHOLHDL, LDLDIRECT in the last 72 hours.  Lab Results  Component Value Date   HGBA1C 5.5  05/12/2019   ------------------------------------------------------------------------------------------------------------------ No results for input(s): TSH, T4TOTAL, T3FREE, THYROIDAB in the last 72 hours.  Invalid input(s): FREET3 ------------------------------------------------------------------------------------------------------------------ No results for input(s): VITAMINB12, FOLATE, FERRITIN, TIBC, IRON, RETICCTPCT in the last 72 hours.  Coagulation profile No results for input(s): INR, PROTIME in the last 168 hours.  Recent Labs    05/13/19 0258  DDIMER 0.72*    Cardiac Enzymes No results for input(s): CKMB, TROPONINI, MYOGLOBIN in the last 168 hours.  Invalid input(s): CK ------------------------------------------------------------------------------------------------------------------    Component Value Date/Time   BNP 696.0 (H) 05/13/2019 [id number]    Roxan Hockey M.D on 05/15/2019 at 7:07 PM  Go to [email or web address] - for contact info  [facility] - Office  [phone]

## 2019-05-15 NOTE — Consult Note (Signed)
.   Cardiology Consultation:   Patient ID: Heather Newman; [id number]; [birth year]   Admit date: 05/12/2019 Date of Consult: 05/15/2019  Primary Care Provider: [name], NP Consulting Cardiologist: Satira Sark, MD Primary Electrophysiologist: None   Patient Profile:   Heather Newman is a 79 y.o. female with a reported history of COPD with tobacco abuse, hepatic cirrhosis with history of encephalopathy, type 2 diabetes mellitus, paroxysmal atrial fibrillation, possible coronary and peripheral vascular disease, and dementia who is being seen today for the evaluation of atrial flutter at the request of Dr. Denton Brick.  History of Present Illness:   [name] admitted to the hospital with worsening shortness of breath, documentation of hypoxic respiratory failure (presumably acute on chronic) the setting of suspected COPD exacerbation.  She apparently recently moved from an SNF in [place] to live with her granddaughter locally.  Patient's heart rate has been intermittently elevated during hospital stay.  I have reviewed her recent tracings, and it looks like she has been in atrial flutter since presentation, although with variable conduction at times and controlled heart rate.  Current regimen includes Cardizem CD.  She did receive a dose of IV Lopressor overnight for heart rate control.  CHA2DS2-VASc score is at least 6 based on available information.  History and physical indicates known atrial fibrillation and reportedly no anticoagulation due to fall risk.  Her outpatient regimen included aspirin and Cartia XT 120 mg daily.  Patient was not able to provide any details about her cardiac history, other than telling me that she had some type of event several years ago.  She seems to be aware of having been told her heart rate is irregular and fast at times, and she does feel palpitations intermittently.  Past Medical History:  Diagnosis Date  . Alzheimer's disease ([place])     . 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 Bilateral primary osteoarthritis of knee   . CAD (coronary artery disease)   . Calculus of ureter   . COPD (chronic obstructive pulmonary disease) ([place])   . Depression   . Displaced spiral fracture of shaft of left femur, subsequent encounter for closed fracture with routine healing   . Essential hypertension   . GERD (gastroesophageal reflux disease)   . Hepatic cirrhosis ([place])   . Hyperlipidemia   . LBBB (left bundle branch block)   . Melanocytic nevi, unspecified   . OSA (obstructive sleep apnea)   . PAF (paroxysmal atrial fibrillation) ([place])   . Type 2 diabetes mellitus ([place])   . Vitamin D deficiency     Past Surgical History:  Procedure Laterality Date  . CHOLECYSTECTOMY    . SPINE SURGERY    . STRABISMUS SURGERY       Inpatient Medications: Scheduled Meds: . aspirin EC  81 mg Oral q morning - 10a  . Chlorhexidine Gluconate Cloth  6 each Topical Daily  . diltiazem  240 mg Oral Daily  . doxycycline  100 mg Oral Q12H  . [START ON 05/16/2019] enoxaparin (LOVENOX) injection  40 mg Subcutaneous Q24H  . famotidine  20 mg Oral BID  . furosemide  20 mg Oral q morning - 10a  . gabapentin  400 mg Oral BID  . insulin aspart  0-20 Units Subcutaneous TID WC  . insulin aspart  0-5 Units Subcutaneous QHS  . insulin glargine  10 Units Subcutaneous QHS  . lactulose  20 g Oral BID  .  levalbuterol  0.63 mg Nebulization TID  . methylPREDNISolone (SOLU-MEDROL) injection  40 mg Intravenous Q8H  . mirtazapine  15 mg Oral QHS  . mometasone-formoterol  2 puff Inhalation Daily  . nicotine  21 mg Transdermal Daily  . nutrition supplement (JUVEN)  1 packet Oral BID BM  . pravastatin  10 mg Oral QHS  . sodium chloride flush  3 mL Intravenous Q12H   Continuous Infusions: . sodium chloride     PRN Meds: sodium chloride, albuterol, bisacodyl, methocarbamol, sodium chloride, sodium chloride  flush  Allergies:    Allergies  Allergen Reactions  . Prednisone     Increase in blood sugar levels that resulted in medication change per family    Social History:   Social History   Socioeconomic History  . Marital status: Divorced    Spouse name: Not on file  . Number of children: Not on file  . Years of education: Not on file  . Highest education level: Not on file  Occupational History  . Not on file  Tobacco Use  . Smoking status: Current Every Day Smoker    Types: Cigarettes  . Smokeless tobacco: Never Used  Substance and Sexual Activity  . Alcohol use: Never  . Drug use: Never  . Sexual activity: Not on file  Other Topics Concern  . Not on file  Social History Narrative  . Not on file   Social Determinants of Health   Financial Resource Strain:   . Difficulty of Paying Living Expenses: Not on file  Food Insecurity:   . Worried About Charity fundraiser in the Last Year: Not on file  . Ran Out of Food in the Last Year: Not on file  Transportation Needs:   . Lack of Transportation (Medical): Not on file  . Lack of Transportation (Non-Medical): Not on file  Physical Activity:   . Days of Exercise per Week: Not on file  . Minutes of Exercise per Session: Not on file  Stress:   . Feeling of Stress : Not on file  Social Connections:   . Frequency of Communication with Friends and Family: Not on file  . Frequency of Social Gatherings with Friends and Family: Not on file  . Attends Religious Services: Not on file  . Active Member of Clubs or Organizations: Not on file  . Attends Archivist Meetings: Not on file  . Marital Status: Not on file  Intimate Partner Violence:   . Fear of Current or Ex-Partner: Not on file  . Emotionally Abused: Not on file  . Physically Abused: Not on file  . Sexually Abused: Not on file    Family History:   The patient's family history includes Diabetes Mellitus II in her mother; Pneumonia in her father.  ROS:    Please see the history of present illness.  He reports occasional sense of palpitations, recent shortness of breath, no chest pain.  She states that she is prone to falling.  Physical Exam/Data:   Vitals:   05/15/19 0500 05/15/19 0600 05/15/19 0700 05/15/19 0730  BP: 112/85 102/62 112/61   Pulse: 82 65 85   Resp: (!) 24 (!) 23 (!) 22   Temp:    97.9 F (36.6 C)  TempSrc:    Oral  SpO2: 95% 97% 98%   Weight: 80.4 kg     Height:        Intake/Output Summary (Last 24 hours) at 05/15/2019 1043 Last data filed at 05/15/2019 0500 Gross  per 24 hour  Intake 200 ml  Output 800 ml  Net -600 ml   Filed Weights   05/13/19 0931 05/14/19 0416 05/15/19 0500  Weight: 80.7 kg 80.7 kg 80.4 kg   Body mass index is 29.5 kg/m.   Gen: Elderly woman, appears comfortable at rest. HEENT: Conjunctiva and lids normal, oropharynx clear. Neck: Supple, no elevated JVP or carotid bruits, no thyromegaly. Lungs: Scattered rhonchi. Cardiac: Largely regular, no S3, soft systolic murmur. Abdomen: Soft, bowel sounds present, no guarding or rebound. Extremities: No pitting edema, distal pulses 2+. Skin: Warm and dry. Musculoskeletal: No kyphosis. Neuropsychiatric: Alert and oriented x2, affect grossly appropriate.  EKG:  An ECG dated 05/15/2019 was personally reviewed today and demonstrated:  Typical atrial flutter with variable conduction and left bundle branch block.  Telemetry:  I personally reviewed telemetry which shows atrial flutter with 2:1 block and RVR.  Relevant CV Studies:  No prior cardiac testing available for review.  Laboratory Data:  Chemistry Recent Labs  Lab 05/12/19 1935 05/13/19 0258 05/14/19 0411  NA 141 142 141  K 3.8 4.0 4.1  CL 96* 101 98  CO2 37* 34* 34*  GLUCOSE 179* 175* 172*  BUN 9 11 23   CREATININE 0.52 0.52 0.68  CALCIUM 8.7* 8.5* 8.6*  GFRNONAA >60 >60 >60  GFRAA >60 >60 >60  ANIONGAP 8 7 9     Recent Labs  Lab 05/12/19 1935  PROT 6.4*  ALBUMIN 3.1*   AST 23  ALT 20  ALKPHOS 115  BILITOT 0.7   Hematology Recent Labs  Lab 05/12/19 1935 05/13/19 0258 05/14/19 0411  WBC 5.9 5.0 5.5  RBC 4.05 4.24 3.93  HGB 12.5 12.9 12.2  HCT 43.1 45.0 40.7  MCV 106.4* 106.1* 103.6*  MCH 30.9 30.4 31.0  MCHC 29.0* 28.7* 30.0  RDW 18.2* 17.9* 18.6*  PLT 192 192 187   Cardiac Enzymes Recent Labs  Lab 05/13/19 0258 05/13/19 0602  TROPONINIHS 13 12   BNP Recent Labs  Lab 05/13/19 0156  BNP 696.0*    DDimer Recent Labs  Lab 05/13/19 0258  DDIMER 0.72*    Radiology/Studies:  DG Chest Port 1 View  Result Date: 05/12/2019 CLINICAL DATA:  Shortness of breath. EXAM: PORTABLE CHEST 1 VIEW COMPARISON:  None. FINDINGS: Tortuous thoracic aorta. Heart size appears mildly enlarged. There are diffuse bilateral coarse interstitial opacities which may reflect chronic bronchitic change and scarring. No definite focal infiltrate. No pneumothorax or large pleural effusion. No acute finding in the visualized skeleton. IMPRESSION: Diffuse bilateral coarse interstitial opacities which may reflect chronic bronchitic change and scarring. No definite active disease. Electronically Signed   By: Audie Pinto M.D.   On: 05/12/2019 20:03    Assessment and Plan:   1.  Typical atrial flutter with RVR.  Duration is uncertain, but she does have an apparent history of paroxysmal atrial fibrillation, and could have potentially been in this rhythm for quite some time, although with variable conduction and relatively controlled rates.  Physiologic stressors are likely contributing to elevated heart rate now.  As mentioned above she has a CHA2DS2-VASc score of at least 6 with high stroke risk, however has not been anticoagulated apparently due to frequent falls and bleeding risk.  She was on aspirin and Cartia XT as an outpatient.  Present regimen includes Cardizem CD which has been increased to 240 mg daily, she also responded to a dose of IV Lopressor  overnight.  2.  Possible cardiovascular and peripheral vascular disease, details not  clear.  Patient does not describe any obvious angina symptoms.  Left bundle branch block is old based on available information.  3.  Acute on possibly chronic hypoxic respiratory failure with COPD exacerbation, current management continues per hospitalist team.  4.  History of hepatic cirrhosis and encephalopathy.  5.  Possible history of cardiomyopathy, details not clear.  6.  Dementia.  Case discussed with Dr. Denton Brick.  Continue Cardizem CD 240 mg daily for now along with aspirin.  Recommend discussion with patient and her granddaughter/caregiver regarding anticoagulation and risks of stroke versus bleeding with reported frequent falls.  This may have already been addressed in the past, but need to clarify whether she will continue on aspirin for now.  For now we will add low-dose standing oral Lopressor, and also have an as needed IV dose for elevated heart rate (she did respond well to that overnight).  Hopefully, as her clinical status improves, heart rate control will be less of an issue.  Would not obtain a follow-up echocardiogram until her heart rates are controlled.  Of note, she is not a very good candidate for amiodarone in light of reported lung and liver disease.  Signed, Rozann Lesches, MD  05/15/2019 10:43 AM

## 2019-05-15 NOTE — Progress Notes (Signed)
Physical Therapy Treatment Patient Details Name: Jaymeson Bakeman MRN: [id number] DOB: October 06, 1940 Today's Date: 05/15/2019    History of Present Illness Valois Palmiter is a 79 y.o. female with a history of dementia, DM2, atrial fibrillation (not on medications due to falls), COPD, systolic heart failure, HTN, cirrhosis, OSA.  Patient was in a nursing home in [place] and was recently taken out of the nursing home and brought to [place] by the patient's granddaughter.  She currently lives with her granddaughter and a caretaker.  As the patient is acutely encephalopathic, history was obtained from the caretaker after verbal permission from patient's granddaughter.  Patient has been getting increasingly short of breath over the past couple of days, but acutely worsened today they have been trying to give her albuterol inhalers, although this has not been completely accessible.  Due to increasing auditory hallucinations, EMS was called and they noted that she was extremely hypoxic to the 70s but responded adequately to a nonrebreather. They brought her to the emergency room for evaluation.    PT Comments    Patient able to complete bed mobility and transfers with min A . She is able to complete exercises with good carry over following prior demonstration of exercises. She demonstrates impaired balance upon standing and rests arms on RW due to hand impairments. She tends to stand with trunk flexed with RW despite cueing for improving posture. While standing HR increases to 130bpm and SpO2 drops into low 80s. Patient is instructed on pursed lip breathing with limited carry over for completing appriopriatly but when she does perform O2 sat does increase. Patient ambulates to chair to end session - RN aware. Vitals improved to HR in low 100s and O2 sat above 90 upon returning to seated. Patient will benefit from continued physical therapy in hospital and recommended venue below to increase strength,  balance, endurance for safe ADLs and gait.    Follow Up Recommendations  SNF     Equipment Recommendations  None recommended by PT    Recommendations for Other Services       Precautions / Restrictions Precautions Precautions: Fall Precaution Comments: Watch HR and Oxygen during treatment sessions. Oxygen level drops during activities. HR increased when seated on EOB and on BSC. Restrictions Weight Bearing Restrictions: No    Mobility  Bed Mobility Overal bed mobility: Needs Assistance Bed Mobility: Supine to Sit     Supine to sit: Min assist;HOB elevated     General bed mobility comments: increased time, labored movement, has to use bed rail  Transfers Overall transfer level: Needs assistance Equipment used: Rolling walker (2 wheeled) Transfers: Sit to/from [facility] Sit to Stand: Min assist Stand pivot transfers: Min assist       General transfer comment: slow labored movement  Ambulation/Gait Ambulation/Gait assistance: Mod assist Gait Distance (Feet): 5 Feet Assistive device: Rolling walker (2 wheeled) Gait Pattern/deviations: Decreased step length - right;Decreased step length - left;Decreased stride length Gait velocity: decreased   General Gait Details: limited to small shuffled steps at bedside to ambulate to chair; has to lean on RW with elbows due to bilateral weakness in hands; limited by fatigue and weakness   Stairs             Wheelchair Mobility    Modified Rankin (Stroke Patients Only)       Balance Overall balance assessment: Needs assistance Sitting-balance support: Feet supported;No upper extremity supported Sitting balance-Leahy Scale: Fair Sitting balance - Comments: fair/good seated at EOB  Standing balance support: During functional activity;Bilateral upper extremity supported Standing balance-Leahy Scale: Poor Standing balance comment: fair/poor using RW                             Cognition Arousal/Alertness: Awake/alert Behavior During Therapy: WFL for tasks assessed/performed Overall Cognitive Status: Within Functional Limits for tasks assessed                                        Exercises General Exercises - Lower Extremity Ankle Circles/Pumps: AROM;Both;20 reps;Seated Long Arc Quad: AROM;Both;20 reps;Seated Hip Flexion/Marching: AROM;Both;20 reps;Seated    General Comments        Pertinent Vitals/Pain Pain Assessment: No/denies pain    Home Living                      Prior Function            PT Goals (current goals can now be found in the care plan section) Acute Rehab PT Goals Patient Stated Goal: return home with family to assist PT Goal Formulation: With patient Time For Goal Achievement: 05/28/19 Potential to Achieve Goals: Good Progress towards PT goals: Progressing toward goals    Frequency    Min 3X/week      PT Plan Current plan remains appropriate    Co-evaluation              AM-PAC PT "6 Clicks" Mobility   Outcome Measure  Help needed turning from your back to your side while in a flat bed without using bedrails?: A Little Help needed moving from lying on your back to sitting on the side of a flat bed without using bedrails?: A Little Help needed moving to and from a bed to a chair (including a wheelchair)?: A Little Help needed standing up from a chair using your arms (e.g., wheelchair or bedside chair)?: A Little Help needed to walk in hospital room?: A Lot Help needed climbing 3-5 steps with a railing? : A Lot 6 Click Score: 16    End of Session Equipment Utilized During Treatment: Gait belt Activity Tolerance: Patient tolerated treatment well;Patient limited by fatigue Patient left: in chair;with call bell/phone within reach Nurse Communication: Mobility status PT Visit Diagnosis: Unsteadiness on feet (R26.81);Other abnormalities of gait and mobility (R26.89);Muscle weakness  (generalized) (M62.81)     Time: 1430-1450 PT Time Calculation (min) (ACUTE ONLY): 20 min  Charges:  $Therapeutic Activity: 8-22 mins                     3:30 PM, 05/15/19 Mearl Latin PT, DPT Physical Therapist at [facility]

## 2019-05-15 NOTE — Progress Notes (Signed)
Patient was tachy last night in the 130s, 5mg  metoprolol controlled HR, but rhythm appeared Afib later. 10mg  cardizem IV was given. Patient remains rate controlled, but in afib. Afib likely 2/2 pulm disease. Start anticoagulation. SBP stable at 102. Continue to monitor.

## 2019-05-15 NOTE — Progress Notes (Signed)
Dr. Orlin Hilding made aware that pt is now in [place]. EKG obtained and  Read to MD. Waiting for orders/call back. Will continue to monitor pt

## 2019-05-15 NOTE — Progress Notes (Signed)
Pts HR sustaining in 130s, Dr, Orlin Hilding  Paged and made aware. Pt asymptomatic. Waiting for orders/call back. Will continue to monitor patient

## 2019-05-15 NOTE — TOC Progression Note (Addendum)
Transition of Care [facility]) - Progression Note    Patient Details  Name: Heather Newman MRN: [id number] Date of Birth: 1940-04-27  [facility] CM/SW Contact  Boneta Lucks, RN Phone Number: 05/15/2019, 2:29 PM  Clinical Narrative:   Fortunato Curling made a bed offer. Jackelyn Poling confirmed they are ready and her COVID test on 2/13 is okay.  CM could not reach grand- daughter to approve this bed offer. Patient will be medically ready next 1-2 days. TOC to follow.   Addendum: Rozanna Boer daughter is accepting Bed at [facility], MD and RN updated  Expected Discharge Plan: [place] Barriers to Discharge: Continued Medical Work up  Expected Discharge Plan and Services Expected Discharge Plan: [place] Choice: Durable Medical Equipment Living arrangements for the past 2 months: [place]

## 2019-05-15 NOTE — Progress Notes (Signed)
ANTICOAGULATION CONSULT NOTE - Initial Consult  Pharmacy Consult for Lovenox dosing Indication: atrial fibrillation  Allergies  Allergen Reactions  . Prednisone     Increase in blood sugar levels that resulted in medication change per family    Patient Measurements: Height: 5\' 5"  (165.1 cm) Weight: 177 lb 4 oz (80.4 kg) IBW/kg (Calculated) : 57 Heparin Dosing Weight: HEPARIN DW (KG): 74.1  Vital Signs: Temp: 98.2 F (36.8 C) (02/16 0400) Temp Source: Axillary (02/16 0400) BP: 102/62 (02/16 0600) Pulse Rate: 65 (02/16 0600)  Labs: Recent Labs    05/12/19 1935 05/12/19 1935 05/13/19 0258 05/13/19 0602 05/14/19 0411  HGB 12.5   < > 12.9  --  12.2  HCT 43.1  --  45.0  --  40.7  PLT 192  --  192  --  187  CREATININE 0.52  --  0.52  --  0.68  TROPONINIHS  --   --  13 12  --    < > = values in this interval not displayed.    Estimated Creatinine Clearance: 61.7 mL/min (by C-G formula based on SCr of 0.68 mg/dL).   Medical History: Past Medical History:  Diagnosis Date  . Altered mental status, unspecified   . Alzheimer's disease, unspecified (CODE) ([place])   . Atherosclerosis of native arteries of right leg with ulceration of other part of foot ([place])   . Atherosclerotic heart disease of native coronary artery without angina pectoris   . Atrial premature depolarization   . Bilateral primary osteoarthritis of knee   . Calculus of ureter   . Chronic systolic (congestive) heart failure ([place])   . Circadian rhythm sleep disorder, advanced sleep phase type   . Cognitive communication deficit   . Constipation, unspecified   . Dementia in other diseases classified elsewhere without behavioral disturbance ([place])   . Difficulty in walking, not elsewhere classified   . Displaced spiral fracture of shaft of left femur, subsequent encounter for closed fracture with routine healing   . Emphysema, unspecified ([place])   . Erythematous condition, unspecified   . GERD (gastroesophageal  reflux disease)   . Hyperlipidemia   . Hypertension   . Immobility syndrome (paraplegic)   . Insomnia due to medical condition   . Left bundle-branch block, unspecified   . Localized edema   . Major depressive disorder, single episode, unspecified   . Melanocytic nevi, unspecified   . Metabolic encephalopathy   . Nicotine dependence, unspecified, uncomplicated   . Obstructive sleep apnea (adult) (pediatric)   . Other cirrhosis of liver ([place])   . Other disorders of peripheral nervous system   . Other fatigue   . Other hypersomnia   . Other symptoms and signs concerning food and fluid intake   . Other symptoms and signs involving the musculoskeletal system   . Pain, unspecified   . Sleep disorder   . Snoring   . Stereotyped movement disorders   . Unspecified atrial fibrillation ([place])   . Vitamin D deficiency, unspecified      Assessment: Pharmacy consulted to dose enoxaparin for this 79 yo female with complicated medical history and atrial fibrillation.  She has been on Lovenox 40mg  daily for prophylaxis, with her last dose of 40mg  on 12/15/at 2115.  Baseline CBC is WNL and renal function is stable.  She is currently taking low-dose aspirin.   Goal of Therapy:  Anti-Xa level 0.6-1 units/ml 4hrs after LMWH dose given Monitor platelets by anticoagulation protocol: Yes   Plan:  Discontinue prophylactic  Lovenox. Start Lovenox 80mg  sub-q q12h Monitor CBC and renal function Monitor for signs and symptoms of bleeding.  Despina Pole 05/15/2019,6:39 AM

## 2019-05-16 ENCOUNTER — Inpatient Hospital Stay (HOSPITAL_COMMUNITY): Payer: Medicare Other

## 2019-05-16 ENCOUNTER — Telehealth (HOSPITAL_COMMUNITY): Payer: Self-pay | Admitting: Physical Therapy

## 2019-05-16 ENCOUNTER — Ambulatory Visit: Payer: Medicare Other | Admitting: Family Medicine

## 2019-05-16 DIAGNOSIS — I4892 Unspecified atrial flutter: Secondary | ICD-10-CM | POA: Diagnosis present

## 2019-05-16 DIAGNOSIS — I361 Nonrheumatic tricuspid (valve) insufficiency: Secondary | ICD-10-CM

## 2019-05-16 DIAGNOSIS — Z8679 Personal history of other diseases of the circulatory system: Secondary | ICD-10-CM

## 2019-05-16 DIAGNOSIS — I5022 Chronic systolic (congestive) heart failure: Secondary | ICD-10-CM

## 2019-05-16 DIAGNOSIS — I340 Nonrheumatic mitral (valve) insufficiency: Secondary | ICD-10-CM

## 2019-05-16 DIAGNOSIS — I25118 Atherosclerotic heart disease of native coronary artery with other forms of angina pectoris: Secondary | ICD-10-CM

## 2019-05-16 DIAGNOSIS — J441 Chronic obstructive pulmonary disease with (acute) exacerbation: Secondary | ICD-10-CM

## 2019-05-16 LAB — GLUCOSE, CAPILLARY
Glucose-Capillary: 181 mg/dL — ABNORMAL HIGH (ref 70–99)
Glucose-Capillary: 288 mg/dL — ABNORMAL HIGH (ref 70–99)
Glucose-Capillary: 175 mg/dL — ABNORMAL HIGH (ref 70–99)

## 2019-05-16 LAB — BASIC METABOLIC PANEL
Anion gap: 9 (ref 5–15)
BUN: 32 mg/dL — ABNORMAL HIGH (ref 8–23)
CO2: 35 mmol/L — ABNORMAL HIGH (ref 22–32)
Calcium: 9.2 mg/dL (ref 8.9–10.3)
Chloride: 99 mmol/L (ref 98–111)
Creatinine, Ser: 0.51 mg/dL (ref 0.44–1.00)
GFR calc Af Amer: >60 mL/min (ref >60)
GFR calc non Af Amer: >60 mL/min (ref >60)
Glucose, Bld: 198 mg/dL — ABNORMAL HIGH (ref 70–99)
Potassium: 4.3 mmol/L (ref 3.5–5.1)
Sodium: 143 mmol/L (ref 135–145)

## 2019-05-16 LAB — CBC
HCT: 43.5 % (ref 36.0–46.0)
Hemoglobin: 12.9 g/dL (ref 12.0–15.0)
MCH: 30.4 pg (ref 26.0–34.0)
MCHC: 29.7 g/dL — ABNORMAL LOW (ref 30.0–36.0)
MCV: 102.6 fL — ABNORMAL HIGH (ref 80.0–100.0)
Platelets: 203 10*3/uL (ref 150–400)
RBC: 4.24 MIL/uL (ref 3.87–5.11)
RDW: 19.1 % — ABNORMAL HIGH (ref 11.5–15.5)
WBC: 7.4 10*3/uL (ref 4.0–10.5)
nRBC: 0 % (ref 0.0–0.2)

## 2019-05-16 LAB — ECHOCARDIOGRAM COMPLETE
Height: 65 in
Weight: 2850.11 oz

## 2019-05-16 MED ORDER — METOPROLOL SUCCINATE ER 50 MG PO TB24: 50.0000 mg | ORAL_TABLET | Freq: Every day | ORAL | 3 refills | Status: DC

## 2019-05-16 MED ORDER — ASPIRIN EC 81 MG PO TBEC: 81.0000 mg | DELAYED_RELEASE_TABLET | Freq: Every day | ORAL | 2 refills | Status: DC

## 2019-05-16 MED ORDER — MOMETASONE FURO-FORMOTEROL FUM 100-5 MCG/ACT IN AERO: 2.0000 | INHALATION_SPRAY | Freq: Every day | RESPIRATORY_TRACT | 3 refills | Status: DC

## 2019-05-16 MED ORDER — GABAPENTIN 300 MG PO CAPS: 300.0000 mg | ORAL_CAPSULE | Freq: Two times a day (BID) | ORAL | 2 refills | Status: DC

## 2019-05-16 MED ORDER — FUROSEMIDE 20 MG PO TABS: 20.0000 mg | ORAL_TABLET | Freq: Every day | ORAL | 1 refills | Status: DC

## 2019-05-16 MED ORDER — PREDNISONE 20 MG PO TABS: 40.0000 mg | ORAL_TABLET | Freq: Every day | ORAL | 0 refills | Status: DC

## 2019-05-16 MED ORDER — NICOTINE 21 MG/24HR TD PT24: 21.0000 mg | MEDICATED_PATCH | Freq: Every day | TRANSDERMAL | 0 refills | Status: DC

## 2019-05-16 MED ORDER — DOXYCYCLINE HYCLATE 100 MG PO TABS: 100.0000 mg | ORAL_TABLET | Freq: Two times a day (BID) | ORAL | 0 refills | Status: AC

## 2019-05-16 MED ORDER — INSULIN GLARGINE 100 UNIT/ML ~~LOC~~ SOLN: 10.0000 [IU] | Freq: Every day | SUBCUTANEOUS | 4 refills | Status: DC

## 2019-05-16 MED ORDER — METFORMIN HCL 500 MG PO TABS: 500.0000 mg | ORAL_TABLET | Freq: Two times a day (BID) | ORAL | 1 refills | Status: DC

## 2019-05-16 MED ORDER — LACTULOSE 10 GM/15ML PO SOLN: 20.0000 g | Freq: Two times a day (BID) | ORAL | 2 refills | Status: DC

## 2019-05-16 MED ORDER — DILTIAZEM HCL ER COATED BEADS 240 MG PO CP24: 240.0000 mg | ORAL_CAPSULE | Freq: Every day | ORAL | 2 refills | Status: DC

## 2019-05-16 MED ORDER — INSULIN ASPART 100 UNIT/ML FLEXPEN: 0.0000 [IU] | PEN_INJECTOR | SUBCUTANEOUS | 11 refills | Status: DC

## 2019-05-16 NOTE — Progress Notes (Signed)
Inpatient Diabetes Program Recommendations  AACE/ADA: New Consensus Statement on Inpatient Glycemic Control (2015)  Target Ranges:  Prepandial:   less than 140 mg/dL      Peak postprandial:   less than 180 mg/dL (1-2 hours)      Critically ill patients:  140 - 180 mg/dL   Lab Results  Component Value Date   GLUCAP 181 (H) 05/16/2019   HGBA1C 5.5 05/12/2019    Review of Glycemic Control  Diabetes history: DM 2, Diet controlled  Current orders for Inpatient glycemic control:  Lantus 10 units Novolog 0-20 units tid + hs  A1c 5.5% on 05/12/19 Solumedrol 40 mg Q8 hours  Inpatient Diabetes Program Recommendations:    -  Consider slightly increasing Lantus to 12 units  -  Pt may benefit from Novolog 2-3 units tid meal coverage if eating >50% of meals.  Thanks,  Tama Headings RN, MSN, BC-ADM Inpatient Diabetes Coordinator Team Pager [phone] (8a-5p)

## 2019-05-16 NOTE — Telephone Encounter (Signed)
per the pt's poa the pt will be admitted back into a nursing facilty and wants to cx all of the appts may need to be discharged

## 2019-05-16 NOTE — Discharge Instructions (Signed)
1)Follow-up with cardiologist Dr. Domenic Polite in 2 to 3 weeks for recheck 2)insulin aspart (novoLOG) injection 0- 6 Units  0- 6 Units Subcutaneous, 3 times daily with meals  CBG < 70: Implement Hypoglycemia Standing Orders and refer to Hypoglycemia Standing Orders sidebar report   CBG 70 - 120: 0 unit CBG 121 - 150: 0 unit  CBG 151 - 200: 0 unit  CBG 201 - 250: 1 units  CBG 251 - 300: 2 units  CBG 301 - 350: 4 units   CBG 351 - 400: 6 units  CBG > 400: 6 units  3) continue oxygen via nasal cannula at 2 L/min continuously

## 2019-05-16 NOTE — Care Management Important Message (Signed)
Important Message  Patient Details  Name: Heather Newman MRN: [id number] Date of Birth: 01/06/1941   Medicare Important Message Given:  Yes     Tommy Medal 05/16/2019, 9:29 AM

## 2019-05-16 NOTE — Progress Notes (Signed)
OT Cancellation Note  Patient Details Name: Heather Newman MRN: [id number] DOB: [birth year]   Cancelled Treatment:    Reason Eval/Treat Not Completed: Other (comment). Nsg working with pt this am, pt preparing for breakfast. Will check back at a later time as schedule allows.    Guadelupe Sabin, OTR/L  [phone] 05/16/2019, 9:04 AM

## 2019-05-16 NOTE — Progress Notes (Signed)
*  PRELIMINARY RESULTS* Echocardiogram 2D Echocardiogram has been performed.  Heather Newman 05/16/2019, 11:21 AM

## 2019-05-16 NOTE — Discharge Summary (Signed)
Heather Newman, is a 79 y.o. female  DOB 11/21/1940  MRN [id number].  Admission date:  05/12/2019  Admitting Physician  Amel Gianino Denton Brick, MD  Discharge Date:  05/16/2019   Primary MD  [name], NP  Recommendations for primary care physician for things to follow:   1) follow-up with cardiologist Dr. Domenic Polite in 2 to 3 weeks for recheck 2)insulin aspart (novoLOG) injection 0- 6 Units  0- 6 Units Subcutaneous, 3 times daily with meals  CBG < 70: Implement Hypoglycemia Standing Orders and refer to Hypoglycemia Standing Orders sidebar report   CBG 70 - 120: 0 unit CBG 121 - 150: 0 unit  CBG 151 - 200: 0 unit  CBG 201 - 250: 1 units  CBG 251 - 300: 2 units  CBG 301 - 350: 4 units   CBG 351 - 400: 6 units  CBG > 400: 6 units  3) continue oxygen via nasal cannula at 2 L/min continuously  Admission Diagnosis  COPD exacerbation (HCC) [J44.1] Acute respiratory failure with hypoxia and hypercapnia (HCC) [J96.01, J96.02] Acute on chronic respiratory failure with hypoxia and hypercapnia (HCC) [J96.21, J96.22]   Discharge Diagnosis  COPD exacerbation (HCC) [J44.1] Acute respiratory failure with hypoxia and hypercapnia (HCC) [J96.01, J96.02] Acute on chronic respiratory failure with hypoxia and hypercapnia (HCC) [J96.21, J96.22]    Principal Problem:   Atrial flutter with rapid ventricular response (HCC) Active Problems:   Acute respiratory failure with hypoxia and hypercapnia (HCC)   Diabetes mellitus type 2 with complications (HCC)   Obstructive sleep apnea (adult) (pediatric)   Chronic systolic (congestive) heart failure (HCC)   Dementia in other diseases classified elsewhere without behavioral disturbance (HCC)   Emphysema, unspecified (HCC)   Acute metabolic encephalopathy   Unspecified atrial fibrillation (HCC)   Coronary artery disease of native artery of native heart with stable angina  pectoris (HCC)   Other cirrhosis of liver (HCC)   COPD exacerbation (HCC)   Pressure injury of skin   History of cardiomyopathy      Past Medical History:  Diagnosis Date  . Alzheimer's disease ([place])   . 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 Bilateral primary osteoarthritis of knee   . CAD (coronary artery disease)   . Calculus of ureter   . COPD (chronic obstructive pulmonary disease) ([place])   . Depression   . Displaced spiral fracture of shaft of left femur, subsequent encounter for closed fracture with routine healing   . Essential hypertension   . GERD (gastroesophageal reflux disease)   . Hepatic cirrhosis ([place])   . Hyperlipidemia   . LBBB (left bundle branch block)   . Melanocytic nevi, unspecified   . OSA (obstructive sleep apnea)   . PAF (paroxysmal atrial fibrillation) ([place])   . Type 2 diabetes mellitus ([place])   . Vitamin D deficiency     Past Surgical History:  Procedure Laterality Date  . CHOLECYSTECTOMY    . SPINE SURGERY    .  STRABISMUS SURGERY       HPI  from the history and physical done on the day of admission:    - Chief Complaint: SOB, altered metal status  HPI: Heather Newman is a 79 y.o. female with a history of dementia, DM2, atrial fibrillation (not on medications due to falls), COPD, systolic heart failure, HTN, cirrhosis, OSA.  Patient was in a nursing home in [place] and was recently taken out of the nursing home and brought to [place] by the patient's granddaughter.  She currently lives with her granddaughter and a caretaker.  As the patient is acutely encephalopathic, history was obtained from the caretaker after verbal permission from patient's granddaughter.  Patient has been getting increasingly short of breath over the past couple of days, but acutely worsened today they have been trying to give her albuterol inhalers, although this has not been completely accessible.  Due to  increasing auditory hallucinations, EMS was called and they noted that she was extremely hypoxic to the 70s but responded adequately to a nonrebreather. They brought her to the emergency room for evaluation.  It appears that she was unable to get her [facility] and has been off her inhaled corticosteroid and long-acting bronchodilator.  Emergency Department Course: Patient desatted to the 70s off oxygen.  Checks x-ray shows chronic scarring.  VBG shows a pH of 7.25, PCO2 of 93, PO2 of 77.  Her bicarb is 37.  Covid test is negative    Hospital Course:    Brief Summary: 79 y.o.female smokerwith a history of dementia, DM2, atrial fibrillation (not on AC due to falls), COPD, systolic heart failure, HTN, cirrhosis, OSA admitted with hypoxic and hypercapnic respiratory failure on 05/12/2019  A/p 1) acute hypoxic and hypercapnic respiratory failure----apparently patient has been using her granddaughter's oxygen at home--from time to time -So she may actually have acute on chronic respiratory failure -ABG despite BiPAP continues to show hypercapnic and hypoxic respiratory failure -Auscultation of the lungs on imaging of the chest failed to demonstrate definite evidence of pneumonia -Much improved after treatment with IV Solu-Medrol, bronchodilators and mucolytics, and doxycycline -Review of records reveals a patient is not really allergic to prednisone per se ---Okay to discharge on p.o. doxycycline, as well as prednisone and supplemental oxygen at 2 L/min -Overall improving from a respiratory standpoint with reduced oxygen requirement and no further need for BiPAP - -Avoid high flow oxygen if possible as patient is a CO2 retainer  2) acute COPD exacerbation--- leading to #1 above, treat as above #1  3)PAFib/atrial flutter --- persistent atrial flutter noted, - -Increased Cardizem CD 240 mg daily (was on '120mg'$  PTA) -Be judicious with bronchodilators due to tachycardia Discussed with  Grand-daughter Glenetta Borg and Meda Coffee "Decker" MariJo at [phone] --- at this time they declined full anticoagulation due to concerns about bleeding risk and patient recurrent history of falls with hip fracture- Discussed with cardiology service okay to discharge on Cardizem 240 daily and Toprol-XL 50 mg daily for rate control  4)DM2-A1c 5.5 reflecting excellent diabetic control PTA -Lantus insulin 10 units daily -Use Novolog/Humalog Sliding scale insulin with Accu-Cheks/Fingersticks as ordered  -Metformin as ordered  5)Social/Ethics--- recently moved from SNF facility in [place] -Patient is a full code -MCV is elevated, patient and family deny EtOH use -PT recommends SNF rehab  6)H/o OSA--May use CPAP as facility nightly -Post discharge patient will need outpatient sleep study to see if she qualifies for CPAP  7)Tobacco Abuse--- 2 pack-a-day smoker, continue nicotine patch -Smoking cessation advised especially in view of need for oxygen therapy  8)Acute Metabolic Encephalopathy superimposed on underlying Dementia-- -Mental proved significantly  9)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-- continue lactulose as ordered --MCV is elevated, patient and family deny EtOH use -Platelets are normal  10) generalized weakness and deconditioning--transfer to SNF rehab   Disposition--- discharge to SNF facility, will  need outpatient sleep study to get a CPAP machine  Code Status : Full  Family Communication:   -Discussed with Grand-daughter Glenetta Borg and Meda Coffee "Decker" MariJo at [phone] Consults  :   Cardiology  Discharge Condition: Stable with significantly improved rate control and improved respiratory status  Follow UP  Contact information for after-discharge care    [facility] .   Service: Skilled Nursing Contact information: [address] [place] [phone]               Diet and Activity recommendation:  As advised  Discharge Instructions    Discharge Instructions    Call MD for:  difficulty breathing, headache or visual disturbances   Complete by: As directed    Call MD for:  persistant dizziness or light-headedness   Complete by: As directed    Call MD for:  persistant nausea and vomiting   Complete by: As directed    Call MD for:  severe uncontrolled pain   Complete by: As directed    Call MD for:  temperature >100.4   Complete by: As directed    Diet - low sodium heart healthy   Complete by: As directed    Diet Carb Modified   Complete by: As directed    Discharge instructions   Complete by: As directed    1) follow-up with cardiologist Dr. Domenic Polite in 2 to 3 weeks for recheck 2)insulin aspart (novoLOG) injection 0- 6 Units  0- 6 Units Subcutaneous, 3 times daily with meals  CBG < 70: Implement Hypoglycemia Standing Orders and refer to Hypoglycemia Standing Orders sidebar report   CBG 70 - 120: 0 unit CBG 121 - 150: 0 unit  CBG 151 - 200: 0 unit  CBG 201 - 250: 1 units  CBG 251 - 300: 2 units  CBG 301 - 350: 4 units   CBG 351 - 400: 6 units  CBG > 400: 6 units  3) continue oxygen via nasal cannula at 2 L/min continuously   Increase activity slowly   Complete by: As directed         Discharge Medications     Allergies as of 05/16/2019   No Active Allergies     Medication List    STOP taking these medications   cephALEXin 250 MG capsule Commonly known as: Keflex   lisinopril 2.5 MG tablet Commonly known as: ZESTRIL   methocarbamol 500 MG tablet Commonly known as: ROBAXIN     TAKE these medications   acetaminophen 325 MG tablet Commonly known as: TYLENOL Take 325-650 mg by mouth every 6 (six) hours as needed for mild pain or moderate pain.   albuterol (2.5 MG/3ML) 0.083% nebulizer solution Commonly known as: PROVENTIL Take 3 mLs (2.5 mg  total) by nebulization every 6 (six) hours as needed for wheezing or shortness of breath.   aspirin EC 81 MG tablet Take 1 tablet (81 mg total) by mouth daily with breakfast. What changed: when to take this   bisacodyl  5 MG EC tablet Commonly known as: DULCOLAX Take 5 mg by mouth daily as needed for moderate constipation.   blood glucose meter kit and supplies Dispense based on patient and insurance preference. Use up to four times daily as directed. (FOR ICD-10 E10.9, E11.9).   diltiazem 240 MG 24 hr capsule Commonly known as: CARDIZEM CD Take 1 capsule (240 mg total) by mouth daily. For Heart Start taking on: May 17, 2019 What changed:   medication strength  how much to take  additional instructions   doxycycline 100 MG tablet Commonly known as: VIBRA-TABS Take 1 tablet (100 mg total) by mouth every 12 (twelve) hours for 5 days.   famotidine 20 MG tablet Commonly known as: PEPCID Take 20 mg by mouth 2 (two) times daily.   fluticasone furoate-vilanterol 100-25 MCG/INH Aepb Commonly known as: BREO ELLIPTA Inhale 1 puff into the lungs in the morning and at bedtime.   furosemide 20 MG tablet Commonly known as: LASIX Take 1 tablet (20 mg total) by mouth daily. What changed: when to take this   gabapentin 300 MG capsule Commonly known as: NEURONTIN Take 1 capsule (300 mg total) by mouth 2 (two) times daily. What changed:   medication strength  how much to take   insulin aspart 100 UNIT/ML FlexPen Commonly known as: NOVOLOG Inject 0-6 Units into the skin See admin instructions. 1) follow-up with cardiologist Dr. Domenic Polite in 2 to 3 weeks for recheck 2)insulin aspart (novoLOG) injection 0- 6 Units 0- 6 Units Subcutaneous, 3 times daily with meals CBG < 70: Implement Hypoglycemia Standing Orders and refer to Hypoglycemia Standing Orders sidebar report  CBG 70 - 120: 0 unit CBG 121 - 150: 0 unit  CBG 151 - 200: 0 unit CBG 201 - 250: 1 units CBG 251 - 300: 2 units CBG  301 - 350: 4 units  CBG 351 - 400: 6 units  CBG > 400: 6 units   insulin glargine 100 UNIT/ML injection Commonly known as: LANTUS Inject 0.1 mLs (10 Units total) into the skin at bedtime.   ipratropium-albuterol 0.5-2.5 (3) MG/3ML Soln Commonly known as: DUONEB Take 3 mLs by nebulization 2 (two) times daily.   lactulose 10 GM/15ML solution Commonly known as: CHRONULAC Take 30 mLs (20 g total) by mouth 2 (two) times daily.   magnesium hydroxide 400 MG/5ML suspension Commonly known as: MILK OF MAGNESIA Take 30 mLs by mouth daily as needed for mild constipation.   metFORMIN 500 MG tablet Commonly known as: GLUCOPHAGE Take 1 tablet (500 mg total) by mouth 2 (two) times daily with a meal. What changed: when to take this   metoprolol succinate 50 MG 24 hr tablet Commonly known as: Toprol XL Take 1 tablet (50 mg total) by mouth daily.   mirtazapine 15 MG tablet Commonly known as: REMERON Take 1 tablet (15 mg total) by mouth at bedtime.   mometasone-formoterol 100-5 MCG/ACT Aero Commonly known as: DULERA Inhale 2 puffs into the lungs daily. Start taking on: May 17, 2019   nicotine 21 mg/24hr patch Commonly known as: NICODERM CQ - dosed in mg/24 hours Place 1 patch (21 mg total) onto the skin daily. Start taking on: May 17, 2019   pravastatin 10 MG tablet Commonly known as: PRAVACHOL Take 1 tablet (10 mg total) by mouth daily. What changed: when to take this   predniSONE 20 MG tablet Commonly known as: Deltasone Take 2 tablets (40 mg total) by mouth daily with breakfast.   UNABLE TO FIND  Chuck pads 60/mth   UNABLE TO FIND Depends Diapers up to 72" 120/mth   Vitamin D (Ergocalciferol) 1.25 MG (50000 UNIT) Caps capsule Commonly known as: DRISDOL Take 1 capsule (50,000 Units total) by mouth every 7 (seven) days. What changed: when to take this       Major procedures and Radiology Reports - PLEASE review detailed and final reports for all details, in brief  -    DG Chest Port 1 View  Result Date: 05/12/2019 CLINICAL DATA:  Shortness of breath. EXAM: PORTABLE CHEST 1 VIEW COMPARISON:  None. FINDINGS: Tortuous thoracic aorta. Heart size appears mildly enlarged. There are diffuse bilateral coarse interstitial opacities which may reflect chronic bronchitic change and scarring. No definite focal infiltrate. No pneumothorax or large pleural effusion. No acute finding in the visualized skeleton. IMPRESSION: Diffuse bilateral coarse interstitial opacities which may reflect chronic bronchitic change and scarring. No definite active disease. Electronically Signed   By: Audie Pinto M.D.   On: 05/12/2019 20:03   ECHOCARDIOGRAM COMPLETE  Result Date: 05/16/2019    ECHOCARDIOGRAM REPORT   Patient Name:   Heather Newman Date of Exam: 05/16/2019 Medical Rec #:  [id number]        Height:       65.0 in Accession #:    [id number]       Weight:       178.1 lb Date of Birth:  08/21/1940        BSA:          1.88 m Patient Age:    72 years         BP:           118/83 mmHg Patient Gender: F                HR:           120 bpm. Exam Location:  [facility] Procedure: 2D Echo Indications:    Atrial Flutter 427.32 / I48.92  History:        Patient has no prior history of Echocardiogram examinations.                 COPD, Arrythmias:Atrial Flutter; Risk Factors:Diabetes and                 Current Smoker. Obstructive sleep apnea , Left bundle-branch                 block, Alzheimer's disease.  Sonographer:    Leavy Cella RDCS (AE) Referring Phys: [phone] [place]  1. Left ventricular ejection fraction, by estimation, is 55%. The left ventricle has low normal function. The left ventricle has no regional wall motion abnormalities. There is mild asymmetric left ventricular hypertrophy of the septal segment. Left ventricular diastolic parameters are indeterminate. Elevated left ventricular end-diastolic pressure.  2. Right ventricular systolic function is  normal. The right ventricular size is normal. There is moderately elevated pulmonary artery systolic pressure.  3. Left atrial size was severely dilated.  4. Right atrial size was moderately dilated.  5. The mitral valve is degenerative. Mild mitral valve regurgitation.  6. The aortic valve is tricuspid. Aortic valve regurgitation is not visualized. Mild aortic valve sclerosis is present, with no evidence of aortic valve stenosis.  7. The inferior vena cava is dilated in size with >50% respiratory variability, suggesting right atrial pressure of 8 mmHg. FINDINGS  Left Ventricle: Left ventricular ejection fraction, by estimation, is 55%. The left ventricle has low normal function.  The left ventricle has no regional wall motion abnormalities. The left ventricular internal cavity size was normal in size. There is mild asymmetric left ventricular hypertrophy of the septal segment. Abnormal (paradoxical) septal motion, consistent with left bundle branch block. Left ventricular diastolic parameters are indeterminate. Elevated left ventricular end-diastolic pressure.  LV Wall Scoring: The basal inferolateral segment is normal. Right Ventricle: The right ventricular size is normal. No increase in right ventricular wall thickness. Right ventricular systolic function is normal. There is moderately elevated pulmonary artery systolic pressure. The tricuspid regurgitant velocity is 2.74 m/s, and with an assumed right atrial pressure of 10 mmHg, the estimated right ventricular systolic pressure is 81.1 mmHg. Left Atrium: Left atrial size was severely dilated. Right Atrium: Right atrial size was moderately dilated. Pericardium: There is no evidence of pericardial effusion. Mitral Valve: The mitral valve is degenerative in appearance. Moderate mitral annular calcification. Mild mitral valve regurgitation. Tricuspid Valve: The tricuspid valve is grossly normal. Tricuspid valve regurgitation is mild. Aortic Valve: The aortic valve is  tricuspid. . There is mild thickening and mild calcification of the aortic valve. Aortic valve regurgitation is not visualized. Mild aortic valve sclerosis is present, with no evidence of aortic valve stenosis. Mild aortic valve annular calcification. There is mild thickening of the aortic valve. There is mild calcification of the aortic valve. Pulmonic Valve: The pulmonic valve was not well visualized. Pulmonic valve regurgitation is not visualized. Aorta: The aortic root is normal in size and structure. Venous: The inferior vena cava is dilated in size with greater than 50% respiratory variability, suggesting right atrial pressure of 8 mmHg. IAS/Shunts: No atrial level shunt detected by color flow Doppler.  LEFT VENTRICLE PLAX 2D LVIDd:         4.73 cm  Diastology LVIDs:         3.57 cm  LV e' lateral:   10.10 cm/s LV PW:         0.96 cm  LV E/e' lateral: 11.3 LV IVS:        1.19 cm  LV e' medial:    6.53 cm/s LVOT diam:     2.10 cm  LV E/e' medial:  17.5 LV SV Index:   25.93 LVOT Area:     3.46 cm  RIGHT VENTRICLE RV S prime:     11.60 cm/s TAPSE (M-mode): 2.1 cm LEFT ATRIUM             Index       RIGHT ATRIUM           Index LA diam:        3.90 cm 2.07 cm/m  RA Area:     17.60 cm LA Vol (A2C):   65.9 ml 34.99 ml/m RA Volume:   50.50 ml  26.82 ml/m LA Vol (A4C):   80.1 ml 42.54 ml/m LA Biplane Vol: 76.2 ml 40.46 ml/m   AORTA Ao Root diam: 3.00 cm MITRAL VALVE                TRICUSPID VALVE MV Area (PHT): 4.71 cm     TR Peak grad:   30.0 mmHg MV Decel Time: 161 msec     TR Vmax:        274.00 cm/s MR Peak grad: 91.4 mmHg MR Mean grad: 52.0 mmHg     SHUNTS MR Vmax:      478.00 cm/s   Systemic Diam: 2.10 cm MR Vmean:     333.0 cm/s MV E velocity: 114.00  cm/s MV A velocity: 47.50 cm/s MV E/A ratio:  2.40 Kate Sable MD Electronically signed by Kate Sable MD Signature Date/Time: 05/16/2019/12:03:29 PM    Final     Micro Results    Recent Results (from the past 240 hour(s))  Respiratory  Panel by RT PCR (Flu A&B, Covid) - Nasopharyngeal Swab     Status: None   Collection Time: 05/12/19  7:35 PM   Specimen: Nasopharyngeal Swab  Result Value Ref Range Status   SARS Coronavirus 2 by RT PCR NEGATIVE NEGATIVE Final    Comment: (NOTE) SARS-CoV-2 target nucleic acids are NOT DETECTED. The SARS-CoV-2 RNA is generally detectable in upper respiratoy specimens during the acute phase of infection. The lowest concentration of SARS-CoV-2 viral copies this assay can detect is 131 copies/mL. A negative result does not preclude SARS-Cov-2 infection and should not be used as the sole basis for treatment or other patient management decisions. A negative result may occur with  improper specimen collection/handling, submission of specimen other than nasopharyngeal swab, presence of viral mutation(s) within the areas targeted by this assay, and inadequate number of viral copies (<131 copies/mL). A negative result must be combined with clinical observations, patient history, and epidemiological information. The expected result is Negative. Fact Sheet for Patients:  PinkCheek.be Fact Sheet for Healthcare Providers:  GravelBags.it This test is not yet ap proved or cleared by the [place] FDA and  has been authorized for detection and/or diagnosis of SARS-CoV-2 by FDA under an Emergency Use Authorization (EUA). This EUA will remain  in effect (meaning this test can be used) for the duration of the COVID-19 declaration under Section 564(b)(1) of the Act, 21 U.S.C. section 360bbb-3(b)(1), unless the authorization is terminated or revoked sooner.    Influenza A by PCR NEGATIVE NEGATIVE Final   Influenza B by PCR NEGATIVE NEGATIVE Final    Comment: (NOTE) The Xpert Xpress SARS-CoV-2/FLU/RSV assay is intended as an aid in  the diagnosis of influenza from Nasopharyngeal swab specimens and  should not be used as a sole basis for  treatment. Nasal washings and  aspirates are unacceptable for Xpert Xpress SARS-CoV-2/FLU/RSV  testing. Fact Sheet for Patients: PinkCheek.be Fact Sheet for Healthcare Providers: GravelBags.it This test is not yet approved or cleared by the [place] FDA and  has been authorized for detection and/or diagnosis of SARS-CoV-2 by  FDA under an Emergency Use Authorization (EUA). This EUA will remain  in effect (meaning this test can be used) for the duration of the  Covid-19 declaration under Section 564(b)(1) of the Act, 21  U.S.C. section 360bbb-3(b)(1), unless the authorization is  terminated or revoked. Performed at [facility], [address], [place], [place] [address]   MRSA PCR Screening     Status: None   Collection Time: 05/13/19 10:30 AM   Specimen: Nasal Mucosa; Nasopharyngeal  Result Value Ref Range Status   MRSA by PCR NEGATIVE NEGATIVE Final    Comment:        The GeneXpert MRSA Assay (FDA approved for NASAL specimens only), is one component of a comprehensive MRSA colonization surveillance program. It is not intended to diagnose MRSA infection nor to guide or monitor treatment for MRSA infections. Performed at [facility], [address] [place], [place] [address]        Today   [place] today has no new complaints -Eating and drinking well, no fevers no chills no chest pains no palpitations no productive cough  Patient has been seen and examined prior to discharge   Objective   Blood pressure 119/60, pulse 64, temperature 98.3 F (36.8 C), temperature source Oral, resp. rate (!) 29, height _0  (1.651 m), weight 80.8 kg, SpO2 94 %.   Intake/Output Summary (Last 24 hours) at 05/16/2019 1510 Last data filed at 05/16/2019 0900 Gross per 24 hour  Intake 360 ml  Output 750 ml  Net -390 ml    Exam Gen:- Awake Alert, able to speak in sentences  HEENT:- [place].AT,  No sclera icterus Nose- [place] 2L/min,  Neck-Supple Neck,No JVD,.  Lungs-improved air movement, no significant wheezing CV- S1, S2 normal, irregular , rate controlled much better abd-  +ve B.Sounds, Abd Soft, No tenderness,    Extremity/Skin:- No  edema, pedal pulses present  Psych--cooperative, some cognitive and memory deficits  neuro-generalized weakness no new focal deficits,+ve tremors   Data Review   CBC w Diff:  Lab Results  Component Value Date   WBC 7.4 05/16/2019   HGB 12.9 05/16/2019   HCT 43.5 05/16/2019   PLT 203 05/16/2019   LYMPHOPCT 13 05/12/2019   MONOPCT 16 05/12/2019   EOSPCT 2 05/12/2019   BASOPCT 1 05/12/2019    CMP:  Lab Results  Component Value Date   NA 143 05/16/2019   K 4.3 05/16/2019   CL 99 05/16/2019   CO2 35 (H) 05/16/2019   BUN 32 (H) 05/16/2019   CREATININE 0.51 05/16/2019   CREATININE 0.57 (L) 03/28/2019   PROT 6.4 (L) 05/12/2019   ALBUMIN 3.1 (L) 05/12/2019   BILITOT 0.7 05/12/2019   ALKPHOS 115 05/12/2019   AST 23 05/12/2019   ALT 20 05/12/2019  . Total Discharge time is about 33 minutes  Roxan Hockey M.D on 05/16/2019 at 3:10 PM  Go to [email or web address] -  for contact info  [facility] - Office  [phone]

## 2019-05-16 NOTE — Progress Notes (Signed)
Nsg Discharge Note  Admit Date:  05/12/2019 Discharge date: 05/16/2019   Heather Newman to be D/C'd to [facility] per MD order.  AVS completed.  Copy for chart, and copy for patient signed, and dated. Patient/EMS able to verbalize understanding. Report called to Roselyn Reef at [facility]; stated patient was already there and she was looking over information that no further information was necessary.   Discharge Medication: Allergies as of 05/16/2019   No Active Allergies     Medication List    STOP taking these medications   cephALEXin 250 MG capsule Commonly known as: Keflex   lisinopril 2.5 MG tablet Commonly known as: ZESTRIL   methocarbamol 500 MG tablet Commonly known as: ROBAXIN     TAKE these medications   acetaminophen 325 MG tablet Commonly known as: TYLENOL Take 325-650 mg by mouth every 6 (six) hours as needed for mild pain or moderate pain.   albuterol (2.5 MG/3ML) 0.083% nebulizer solution Commonly known as: PROVENTIL Take 3 mLs (2.5 mg total) by nebulization every 6 (six) hours as needed for wheezing or shortness of breath.   aspirin EC 81 MG tablet Take 1 tablet (81 mg total) by mouth daily with breakfast. What changed: when to take this   bisacodyl 5 MG EC tablet Commonly known as: DULCOLAX Take 5 mg by mouth daily as needed for moderate constipation.   blood glucose meter kit and supplies Dispense based on patient and insurance preference. Use up to four times daily as directed. (FOR ICD-10 E10.9, E11.9).   diltiazem 240 MG 24 hr capsule Commonly known as: CARDIZEM CD Take 1 capsule (240 mg total) by mouth daily. For Heart Start taking on: May 17, 2019 What changed:   medication strength  how much to take  additional instructions   doxycycline 100 MG tablet Commonly known as: VIBRA-TABS Take 1 tablet (100 mg total) by mouth every 12 (twelve) hours for 5 days.   famotidine 20 MG tablet Commonly known as: PEPCID Take 20 mg by mouth 2 (two)  times daily.   fluticasone furoate-vilanterol 100-25 MCG/INH Aepb Commonly known as: BREO ELLIPTA Inhale 1 puff into the lungs in the morning and at bedtime.   furosemide 20 MG tablet Commonly known as: LASIX Take 1 tablet (20 mg total) by mouth daily. What changed: when to take this   gabapentin 300 MG capsule Commonly known as: NEURONTIN Take 1 capsule (300 mg total) by mouth 2 (two) times daily. What changed:   medication strength  how much to take   insulin aspart 100 UNIT/ML FlexPen Commonly known as: NOVOLOG Inject 0-6 Units into the skin See admin instructions. 1) follow-up with cardiologist Dr. Domenic Polite in 2 to 3 weeks for recheck 2)insulin aspart (novoLOG) injection 0- 6 Units 0- 6 Units Subcutaneous, 3 times daily with meals CBG < 70: Implement Hypoglycemia Standing Orders and refer to Hypoglycemia Standing Orders sidebar report  CBG 70 - 120: 0 unit CBG 121 - 150: 0 unit  CBG 151 - 200: 0 unit CBG 201 - 250: 1 units CBG 251 - 300: 2 units CBG 301 - 350: 4 units  CBG 351 - 400: 6 units  CBG > 400: 6 units   insulin glargine 100 UNIT/ML injection Commonly known as: LANTUS Inject 0.1 mLs (10 Units total) into the skin at bedtime.   ipratropium-albuterol 0.5-2.5 (3) MG/3ML Soln Commonly known as: DUONEB Take 3 mLs by nebulization 2 (two) times daily.   lactulose 10 GM/15ML solution Commonly known as: CHRONULAC Take  30 mLs (20 g total) by mouth 2 (two) times daily.   magnesium hydroxide 400 MG/5ML suspension Commonly known as: MILK OF MAGNESIA Take 30 mLs by mouth daily as needed for mild constipation.   metFORMIN 500 MG tablet Commonly known as: GLUCOPHAGE Take 1 tablet (500 mg total) by mouth 2 (two) times daily with a meal. What changed: when to take this   metoprolol succinate 50 MG 24 hr tablet Commonly known as: Toprol XL Take 1 tablet (50 mg total) by mouth daily.   mirtazapine 15 MG tablet Commonly known as: REMERON Take 1 tablet (15 mg total) by mouth  at bedtime.   mometasone-formoterol 100-5 MCG/ACT Aero Commonly known as: DULERA Inhale 2 puffs into the lungs daily. Start taking on: May 17, 2019   nicotine 21 mg/24hr patch Commonly known as: NICODERM CQ - dosed in mg/24 hours Place 1 patch (21 mg total) onto the skin daily. Start taking on: May 17, 2019   pravastatin 10 MG tablet Commonly known as: PRAVACHOL Take 1 tablet (10 mg total) by mouth daily. What changed: when to take this   predniSONE 20 MG tablet Commonly known as: Deltasone Take 2 tablets (40 mg total) by mouth daily with breakfast.   UNABLE TO FIND Chuck pads 60/mth   UNABLE TO FIND Depends Diapers up to 72" 120/mth   Vitamin D (Ergocalciferol) 1.25 MG (50000 UNIT) Caps capsule Commonly known as: DRISDOL Take 1 capsule (50,000 Units total) by mouth every 7 (seven) days. What changed: when to take this       Discharge Assessment: Vitals:   05/16/19 1600 05/16/19 1606  BP: 122/63   Pulse: 86   Resp: 20 15  Temp:  98.4 F (36.9 C)  SpO2: 95% 93%  Skin clean, dry and intact without evidence of skin break down, no evidence of skin tears noted. IV catheter discontinued intact. Site without signs and symptoms of complications - no redness or edema noted at insertion site, patient denies c/o pain - only slight tenderness at site.  Dressing with slight pressure applied.  D/c Instructions-Education: Discharge instructions given to patient/EMS with verbalized understanding. D/c education completed with patient/EMS including follow up instructions, medication list, d/c activities limitations if indicated, with other d/c instructions as indicated by MD - patient able to verbalize understanding, all questions fully answered. Patient instructed to return to ED, call 911, or call MD for any changes in condition.  Patient escorted via EMS to [place].  Monna Fam, RN 05/16/2019 5:00 PM

## 2019-05-16 NOTE — Progress Notes (Addendum)
Progress Note  Patient Name: Heather Newman Date of Encounter: 05/16/2019  Primary Cardiologist: No primary care provider on file.   Subjective   She is feeling much better.  Breathing has improved.  She complains of bilateral feet pain in her heels.  She denies chest pain and palpitations.  Inpatient Medications    Scheduled Meds: . aspirin EC  81 mg Oral q morning - 10a  . Chlorhexidine Gluconate Cloth  6 each Topical Daily  . diltiazem  240 mg Oral Daily  . doxycycline  100 mg Oral Q12H  . enoxaparin (LOVENOX) injection  40 mg Subcutaneous Q24H  . famotidine  20 mg Oral BID  . furosemide  20 mg Oral q morning - 10a  . gabapentin  400 mg Oral BID  . insulin aspart  0-20 Units Subcutaneous TID WC  . insulin aspart  0-5 Units Subcutaneous QHS  . insulin glargine  10 Units Subcutaneous QHS  . lactulose  20 g Oral BID  . levalbuterol  0.63 mg Nebulization TID  . methylPREDNISolone (SOLU-MEDROL) injection  40 mg Intravenous Q8H  . metoprolol tartrate  25 mg Oral Q8H  . mirtazapine  15 mg Oral QHS  . mometasone-formoterol  2 puff Inhalation Daily  . nicotine  21 mg Transdermal Daily  . nutrition supplement (JUVEN)  1 packet Oral BID BM  . pravastatin  10 mg Oral QHS  . sodium chloride flush  3 mL Intravenous Q12H   Continuous Infusions: . sodium chloride     PRN Meds: sodium chloride, albuterol, bisacodyl, methocarbamol, metoprolol tartrate, sodium chloride, sodium chloride flush   Vital Signs    Vitals:   05/16/19 0742 05/16/19 0745 05/16/19 0801 05/16/19 0811  BP:      Pulse: 64 62    Resp: 19 18    Temp: (!) 97.2 F (36.2 C)     TempSrc: Oral     SpO2: 96% 95% 96% 100%  Weight:      Height:        Intake/Output Summary (Last 24 hours) at 05/16/2019 0911 Last data filed at 05/16/2019 0657 Gross per 24 hour  Intake 240 ml  Output 1000 ml  Net -760 ml   Filed Weights   05/14/19 0416 05/15/19 0500 05/16/19 0500  Weight: 80.7 kg 80.4 kg 80.8 kg    [facility] LPN was present throughout the entirety of the encounter.  Telemetry    Controlled atrial flutter with variable conduction with PVCs- Personally Reviewed  ECG    NA - Personally Reviewed  Physical Exam   GEN: No acute distress.   Neck: No JVD Cardiac:  Irregular rhythm, normal rate, no murmurs, rubs, or gallops.  Respiratory:  Expiratory wheezes bilaterally. GI: Soft, nontender, non-distended  MS: No edema; No deformity. Neuro:  Nonfocal  Psych: Normal affect   Labs    Chemistry Recent Labs  Lab 05/12/19 1935 05/12/19 1935 05/13/19 0258 05/14/19 0411 05/16/19 0355  NA 141   < > 142 141 143  K 3.8   < > 4.0 4.1 4.3  CL 96*   < > 101 98 99  CO2 37*   < > 34* 34* 35*  GLUCOSE 179*   < > 175* 172* 198*  BUN 9   < > 11 23 32*  CREATININE 0.52   < > 0.52 0.68 0.51  CALCIUM 8.7*   < > 8.5* 8.6* 9.2  PROT 6.4*  --   --   --   --  ALBUMIN 3.1*  --   --   --   --   AST 23  --   --   --   --   ALT 20  --   --   --   --   ALKPHOS 115  --   --   --   --   BILITOT 0.7  --   --   --   --   GFRNONAA >60   < > >60 >60 >60  GFRAA >60   < > >60 >60 >60  ANIONGAP 8   < > 7 9 9    < > = values in this interval not displayed.     Hematology Recent Labs  Lab 05/13/19 0258 05/14/19 0411 05/16/19 0355  WBC 5.0 5.5 7.4  RBC 4.24 3.93 4.24  HGB 12.9 12.2 12.9  HCT 45.0 40.7 43.5  MCV 106.1* 103.6* 102.6*  MCH 30.4 31.0 30.4  MCHC 28.7* 30.0 29.7*  RDW 17.9* 18.6* 19.1*  PLT 192 187 203    Cardiac EnzymesNo results for input(s): TROPONINI in the last 168 hours. No results for input(s): TROPIPOC in the last 168 hours.   BNP Recent Labs  Lab 05/13/19 0156  BNP 696.0*     DDimer  Recent Labs  Lab 05/13/19 0258  DDIMER 0.72*     Radiology    No results found.  Cardiac Studies   NA  Patient Profile     79 y.o. female with a reported history of COPD with tobacco abuse, hepatic cirrhosis with history of encephalopathy, type 2 diabetes  mellitus, paroxysmal atrial fibrillation, possible coronary and peripheral vascular disease, and dementia who is being seen today for the evaluation of atrial flutter at the request of Dr. Denton Brick.  Assessment & Plan    1.  Typical atrial flutter with rapid ventricular response: Heart rate currently controlled on Cardizem CD 240 mg daily and metoprolol tartrate 25 mg every 8 hours along with IV metoprolol 2.5 mg as needed.  Heart rate will be easier to control once physiologic stressors subside.  She is also on aspirin.  She has a CHA2DS2-VASc score of at least 6 with high stroke risk, however has not been anticoagulated apparently due to frequent falls and bleeding risk.  I agree that a discussion should be had with the patient and her granddaughter/caregiver regarding anticoagulation and risks of stroke versus bleeding with reported frequent falls.  This will need to be clarified whether or not she will continue on aspirin vs DOAC.  2.  Coronary artery disease: Details unclear.  She denies anginal symptoms.  She has an old left bundle branch block.  She is on aspirin, beta-blocker, and pravastatin.  3.  COPD exacerbation: Management per hospitalist team.  4.  History of cardiomyopathy: Details are unclear.  Can obtain echocardiogram now that heart rate is controlled.  I will order.     For questions or updates, please contact [place] Please consult [email or web address] for contact info under Cardiology/STEMI.      Signed, Kate Sable, MD  05/16/2019, 9:11 AM

## 2019-05-17 ENCOUNTER — Ambulatory Visit (HOSPITAL_COMMUNITY): Payer: Medicare Other | Admitting: Physical Therapy

## 2019-05-21 ENCOUNTER — Ambulatory Visit (HOSPITAL_COMMUNITY): Payer: Medicare Other | Admitting: Physical Therapy

## 2019-05-23 ENCOUNTER — Ambulatory Visit (HOSPITAL_COMMUNITY): Payer: Medicare Other

## 2019-05-28 ENCOUNTER — Encounter (HOSPITAL_COMMUNITY): Payer: Medicare Other | Admitting: Physical Therapy

## 2019-05-31 ENCOUNTER — Encounter (HOSPITAL_COMMUNITY): Payer: Medicare Other | Admitting: Physical Therapy

## 2019-06-05 ENCOUNTER — Encounter (HOSPITAL_COMMUNITY): Payer: Medicare Other

## 2019-06-07 ENCOUNTER — Encounter (HOSPITAL_COMMUNITY): Payer: Medicare Other

## 2019-06-11 ENCOUNTER — Encounter (HOSPITAL_COMMUNITY): Payer: Medicare Other | Admitting: Physical Therapy

## 2019-06-13 ENCOUNTER — Encounter (HOSPITAL_COMMUNITY): Payer: Medicare Other | Admitting: Physical Therapy

## 2019-06-14 ENCOUNTER — Ambulatory Visit (HOSPITAL_COMMUNITY): Payer: Medicare Other | Attending: Family | Admitting: Physical Therapy

## 2019-07-09 ENCOUNTER — Ambulatory Visit: Payer: Medicare Other | Admitting: Podiatry

## 2019-07-15 ENCOUNTER — Encounter (HOSPITAL_COMMUNITY): Payer: Self-pay

## 2019-07-15 ENCOUNTER — Other Ambulatory Visit: Payer: Self-pay

## 2019-07-15 ENCOUNTER — Emergency Department (HOSPITAL_COMMUNITY): Payer: Medicare Other

## 2019-07-15 ENCOUNTER — Inpatient Hospital Stay (HOSPITAL_COMMUNITY)
Admission: EM | Admit: 2019-07-15 | Discharge: 2019-07-17 | DRG: 189 | Disposition: A | Discharge status: Skilled Nursing Facility | Payer: Medicare Other | Source: Skilled Nursing Facility | Attending: Internal Medicine | Admitting: Internal Medicine

## 2019-07-15 DIAGNOSIS — I5022 Chronic systolic (congestive) heart failure: Secondary | ICD-10-CM | POA: Diagnosis present

## 2019-07-15 DIAGNOSIS — I2510 Atherosclerotic heart disease of native coronary artery without angina pectoris: Secondary | ICD-10-CM | POA: Diagnosis present

## 2019-07-15 DIAGNOSIS — Z794 Long term (current) use of insulin: Secondary | ICD-10-CM

## 2019-07-15 DIAGNOSIS — Z7982 Long term (current) use of aspirin: Secondary | ICD-10-CM

## 2019-07-15 DIAGNOSIS — E559 Vitamin D deficiency, unspecified: Secondary | ICD-10-CM | POA: Diagnosis present

## 2019-07-15 DIAGNOSIS — Z7951 Long term (current) use of inhaled steroids: Secondary | ICD-10-CM

## 2019-07-15 DIAGNOSIS — F0280 Dementia in other diseases classified elsewhere without behavioral disturbance: Secondary | ICD-10-CM | POA: Diagnosis present

## 2019-07-15 DIAGNOSIS — F17210 Nicotine dependence, cigarettes, uncomplicated: Secondary | ICD-10-CM | POA: Diagnosis present

## 2019-07-15 DIAGNOSIS — Z20822 Contact with and (suspected) exposure to covid-19: Secondary | ICD-10-CM | POA: Diagnosis present

## 2019-07-15 DIAGNOSIS — I110 Hypertensive heart disease with heart failure: Secondary | ICD-10-CM | POA: Diagnosis present

## 2019-07-15 DIAGNOSIS — J441 Chronic obstructive pulmonary disease with (acute) exacerbation: Secondary | ICD-10-CM | POA: Diagnosis present

## 2019-07-15 DIAGNOSIS — R509 Fever, unspecified: Secondary | ICD-10-CM

## 2019-07-15 DIAGNOSIS — J9600 Acute respiratory failure, unspecified whether with hypoxia or hypercapnia: Secondary | ICD-10-CM

## 2019-07-15 DIAGNOSIS — M170 Bilateral primary osteoarthritis of knee: Secondary | ICD-10-CM | POA: Diagnosis present

## 2019-07-15 DIAGNOSIS — J479 Bronchiectasis, uncomplicated: Secondary | ICD-10-CM | POA: Diagnosis present

## 2019-07-15 DIAGNOSIS — I4892 Unspecified atrial flutter: Secondary | ICD-10-CM | POA: Diagnosis present

## 2019-07-15 DIAGNOSIS — J9621 Acute and chronic respiratory failure with hypoxia: Secondary | ICD-10-CM | POA: Diagnosis not present

## 2019-07-15 DIAGNOSIS — Z9981 Dependence on supplemental oxygen: Secondary | ICD-10-CM

## 2019-07-15 DIAGNOSIS — I10 Essential (primary) hypertension: Secondary | ICD-10-CM | POA: Diagnosis present

## 2019-07-15 DIAGNOSIS — E119 Type 2 diabetes mellitus without complications: Secondary | ICD-10-CM | POA: Diagnosis present

## 2019-07-15 DIAGNOSIS — K7469 Other cirrhosis of liver: Secondary | ICD-10-CM | POA: Diagnosis present

## 2019-07-15 DIAGNOSIS — I447 Left bundle-branch block, unspecified: Secondary | ICD-10-CM | POA: Diagnosis present

## 2019-07-15 DIAGNOSIS — R0602 Shortness of breath: Secondary | ICD-10-CM | POA: Diagnosis not present

## 2019-07-15 DIAGNOSIS — J439 Emphysema, unspecified: Secondary | ICD-10-CM | POA: Diagnosis present

## 2019-07-15 DIAGNOSIS — E785 Hyperlipidemia, unspecified: Secondary | ICD-10-CM | POA: Diagnosis present

## 2019-07-15 DIAGNOSIS — Z7952 Long term (current) use of systemic steroids: Secondary | ICD-10-CM

## 2019-07-15 DIAGNOSIS — K760 Fatty (change of) liver, not elsewhere classified: Secondary | ICD-10-CM | POA: Diagnosis present

## 2019-07-15 DIAGNOSIS — G309 Alzheimer's disease, unspecified: Secondary | ICD-10-CM | POA: Diagnosis present

## 2019-07-15 DIAGNOSIS — G8220 Paraplegia, unspecified: Secondary | ICD-10-CM | POA: Diagnosis present

## 2019-07-15 DIAGNOSIS — I482 Chronic atrial fibrillation, unspecified: Secondary | ICD-10-CM | POA: Diagnosis present

## 2019-07-15 DIAGNOSIS — E118 Type 2 diabetes mellitus with unspecified complications: Secondary | ICD-10-CM | POA: Diagnosis present

## 2019-07-15 DIAGNOSIS — K219 Gastro-esophageal reflux disease without esophagitis: Secondary | ICD-10-CM | POA: Diagnosis present

## 2019-07-15 DIAGNOSIS — Z833 Family history of diabetes mellitus: Secondary | ICD-10-CM

## 2019-07-15 DIAGNOSIS — I480 Paroxysmal atrial fibrillation: Secondary | ICD-10-CM | POA: Diagnosis present

## 2019-07-15 DIAGNOSIS — G4733 Obstructive sleep apnea (adult) (pediatric): Secondary | ICD-10-CM | POA: Diagnosis present

## 2019-07-15 LAB — PHOSPHORUS: Phosphorus: 2.4 mg/dL — ABNORMAL LOW (ref 2.5–4.6)

## 2019-07-15 LAB — COMPREHENSIVE METABOLIC PANEL
ALT: 67 U/L — ABNORMAL HIGH (ref 0–44)
AST: 69 U/L — ABNORMAL HIGH (ref 15–41)
Albumin: 2.8 g/dL — ABNORMAL LOW (ref 3.5–5.0)
Alkaline Phosphatase: 108 U/L (ref 38–126)
Anion gap: 11 (ref 5–15)
BUN: 11 mg/dL (ref 8–23)
CO2: 28 mmol/L (ref 22–32)
Calcium: 8.7 mg/dL — ABNORMAL LOW (ref 8.9–10.3)
Chloride: 101 mmol/L (ref 98–111)
Creatinine, Ser: 0.42 mg/dL — ABNORMAL LOW (ref 0.44–1.00)
GFR calc Af Amer: >60 mL/min (ref >60)
GFR calc non Af Amer: >60 mL/min (ref >60)
Glucose, Bld: 147 mg/dL — ABNORMAL HIGH (ref 70–99)
Potassium: 3.7 mmol/L (ref 3.5–5.1)
Sodium: 140 mmol/L (ref 135–145)
Total Bilirubin: 1.2 mg/dL (ref 0.3–1.2)
Total Protein: 6.1 g/dL — ABNORMAL LOW (ref 6.5–8.1)

## 2019-07-15 LAB — CBC WITH DIFFERENTIAL/PLATELET
Abs Immature Granulocytes: 0.02 10*3/uL (ref 0.00–0.07)
Basophils Absolute: 0.1 10*3/uL (ref 0.0–0.1)
Basophils Relative: 1 %
Eosinophils Absolute: 0.2 10*3/uL (ref 0.0–0.5)
Eosinophils Relative: 2 %
HCT: 39 % (ref 36.0–46.0)
Hemoglobin: 12.6 g/dL (ref 12.0–15.0)
Immature Granulocytes: 0 %
Lymphocytes Relative: 6 %
Lymphs Abs: 0.5 10*3/uL — ABNORMAL LOW (ref 0.7–4.0)
MCH: 31.4 pg (ref 26.0–34.0)
MCHC: 32.3 g/dL (ref 30.0–36.0)
MCV: 97.3 fL (ref 80.0–100.0)
Monocytes Absolute: 0.8 10*3/uL (ref 0.1–1.0)
Monocytes Relative: 10 %
Neutro Abs: 6.8 10*3/uL (ref 1.7–7.7)
Neutrophils Relative %: 81 %
Platelets: 135 10*3/uL — ABNORMAL LOW (ref 150–400)
RBC: 4.01 MIL/uL (ref 3.87–5.11)
RDW: 15 % (ref 11.5–15.5)
WBC: 8.3 10*3/uL (ref 4.0–10.5)
nRBC: 0 % (ref 0.0–0.2)

## 2019-07-15 LAB — BLOOD GAS, ARTERIAL
Acid-Base Excess: 5.8 mmol/L — ABNORMAL HIGH (ref 0.0–2.0)
Bicarbonate: 28.9 mmol/L — ABNORMAL HIGH (ref 20.0–28.0)
FIO2: 36
O2 Saturation: 97.8 %
Patient temperature: 38.8
pCO2 arterial: 51.1 mmHg — ABNORMAL HIGH (ref 32.0–48.0)
pH, Arterial: 7.394 (ref 7.350–7.450)
pO2, Arterial: 115 mmHg — ABNORMAL HIGH (ref 83.0–108.0)

## 2019-07-15 LAB — URINALYSIS, ROUTINE W REFLEX MICROSCOPIC
Bilirubin Urine: NEGATIVE
Glucose, UA: NEGATIVE mg/dL
Hgb urine dipstick: NEGATIVE
Ketones, ur: NEGATIVE mg/dL
Leukocytes,Ua: NEGATIVE
Nitrite: NEGATIVE
Protein, ur: NEGATIVE mg/dL
Specific Gravity, Urine: 1.013 (ref 1.005–1.030)
pH: 5 (ref 5.0–8.0)

## 2019-07-15 LAB — PROCALCITONIN: Procalcitonin: <0.1 ng/mL

## 2019-07-15 LAB — POC SARS CORONAVIRUS 2 AG -  ED: SARS Coronavirus 2 Ag: NEGATIVE

## 2019-07-15 LAB — RESPIRATORY PANEL BY RT PCR (FLU A&B, COVID)
Influenza A by PCR: NEGATIVE
Influenza B by PCR: NEGATIVE
SARS Coronavirus 2 by RT PCR: NEGATIVE

## 2019-07-15 LAB — MAGNESIUM: Magnesium: 1.8 mg/dL (ref 1.7–2.4)

## 2019-07-15 LAB — PROTIME-INR
INR: 1.1 (ref 0.8–1.2)
Prothrombin Time: 14.3 seconds (ref 11.4–15.2)

## 2019-07-15 LAB — LACTIC ACID, PLASMA: Lactic Acid, Venous: 1.8 mmol/L (ref 0.5–1.9)

## 2019-07-15 LAB — APTT: aPTT: 32 seconds (ref 24–36)

## 2019-07-15 LAB — TROPONIN I (HIGH SENSITIVITY): Troponin I (High Sensitivity): 16 ng/L (ref <18)

## 2019-07-15 MED ORDER — ALBUTEROL SULFATE (2.5 MG/3ML) 0.083% IN NEBU
2.5000 mg | INHALATION_SOLUTION | Freq: Once | RESPIRATORY_TRACT | Status: AC
  Administered 2019-07-15: 21:00:00 2.5 mg via RESPIRATORY_TRACT
  Filled 2019-07-15: qty 3

## 2019-07-15 MED ORDER — POTASSIUM CHLORIDE CRYS ER 20 MEQ PO TBCR
40.0000 meq | EXTENDED_RELEASE_TABLET | Freq: Once | ORAL | Status: AC
  Administered 2019-07-16: 40 meq via ORAL
  Filled 2019-07-15: qty 2

## 2019-07-15 MED ORDER — ACETAMINOPHEN 500 MG PO TABS
1000.0000 mg | ORAL_TABLET | Freq: Once | ORAL | Status: AC
  Administered 2019-07-15: 22:00:00 1000 mg via ORAL
  Filled 2019-07-15: qty 2

## 2019-07-15 MED ORDER — ACETAMINOPHEN 650 MG RE SUPP: 650.0000 mg | Freq: Four times a day (QID) | RECTAL | Status: DC | PRN

## 2019-07-15 MED ORDER — AEROCHAMBER PLUS FLO-VU MEDIUM MISC
1.0000 | Freq: Once | Status: AC
  Administered 2019-07-15: 20:00:00 1
  Filled 2019-07-15 (×2): qty 1

## 2019-07-15 MED ORDER — ALBUTEROL SULFATE HFA 108 (90 BASE) MCG/ACT IN AERS
2.0000 | INHALATION_SPRAY | Freq: Once | RESPIRATORY_TRACT | Status: AC
  Administered 2019-07-15: 2 via RESPIRATORY_TRACT
  Filled 2019-07-15: qty 6.7

## 2019-07-15 MED ORDER — ACETAMINOPHEN 325 MG PO TABS: 650.0000 mg | ORAL_TABLET | Freq: Four times a day (QID) | ORAL | Status: DC | PRN

## 2019-07-15 MED ORDER — PREDNISONE 20 MG PO TABS: 40.0000 mg | ORAL_TABLET | Freq: Every day | ORAL | Status: DC

## 2019-07-15 MED ORDER — ONDANSETRON HCL 4 MG PO TABS: 4.0000 mg | ORAL_TABLET | Freq: Four times a day (QID) | ORAL | Status: DC | PRN

## 2019-07-15 MED ORDER — SODIUM CHLORIDE 0.9 % IV BOLUS
1000.0000 mL | Freq: Once | INTRAVENOUS | Status: AC
  Administered 2019-07-15: 22:00:00 1000 mL via INTRAVENOUS

## 2019-07-15 MED ORDER — ALBUTEROL SULFATE (2.5 MG/3ML) 0.083% IN NEBU: 2.5000 mg | INHALATION_SOLUTION | RESPIRATORY_TRACT | Status: DC | PRN

## 2019-07-15 MED ORDER — IPRATROPIUM-ALBUTEROL 0.5-2.5 (3) MG/3ML IN SOLN
3.0000 mL | Freq: Four times a day (QID) | RESPIRATORY_TRACT | Status: DC
  Administered 2019-07-15 – 2019-07-16 (×4): 3 mL via RESPIRATORY_TRACT
  Filled 2019-07-15 (×4): qty 3

## 2019-07-15 MED ORDER — IPRATROPIUM BROMIDE 0.02 % IN SOLN: 0.5000 mg | Freq: Four times a day (QID) | RESPIRATORY_TRACT | Status: DC

## 2019-07-15 MED ORDER — ALBUTEROL SULFATE (2.5 MG/3ML) 0.083% IN NEBU: 2.5000 mg | INHALATION_SOLUTION | Freq: Four times a day (QID) | RESPIRATORY_TRACT | Status: DC

## 2019-07-15 MED ORDER — GUAIFENESIN ER 600 MG PO TB12
600.0000 mg | ORAL_TABLET | Freq: Two times a day (BID) | ORAL | Status: DC
  Administered 2019-07-16 – 2019-07-17 (×4): 600 mg via ORAL
  Filled 2019-07-15 (×4): qty 1

## 2019-07-15 MED ORDER — MAGNESIUM SULFATE 2 GM/50ML IV SOLN
2.0000 g | Freq: Once | INTRAVENOUS | Status: AC
  Administered 2019-07-16: 2 g via INTRAVENOUS
  Filled 2019-07-15: qty 50

## 2019-07-15 MED ORDER — SODIUM CHLORIDE 0.9 % IV SOLN
500.0000 mg | INTRAVENOUS | Status: DC
  Administered 2019-07-15 – 2019-07-16 (×2): 500 mg via INTRAVENOUS
  Filled 2019-07-15 (×2): qty 500

## 2019-07-15 MED ORDER — CHLORHEXIDINE GLUCONATE CLOTH 2 % EX PADS
6.0000 | MEDICATED_PAD | Freq: Every day | CUTANEOUS | Status: DC
  Administered 2019-07-16 – 2019-07-17 (×2): 6 via TOPICAL

## 2019-07-15 MED ORDER — SODIUM CHLORIDE 0.9 % IV SOLN
2.0000 g | INTRAVENOUS | Status: DC
  Administered 2019-07-15 – 2019-07-16 (×2): 2 g via INTRAVENOUS
  Filled 2019-07-15 (×2): qty 20

## 2019-07-15 MED ORDER — DEXAMETHASONE SODIUM PHOSPHATE 10 MG/ML IJ SOLN
10.0000 mg | Freq: Once | INTRAMUSCULAR | Status: AC
  Administered 2019-07-15: 10 mg via INTRAVENOUS
  Filled 2019-07-15: qty 1

## 2019-07-15 MED ORDER — ONDANSETRON HCL 4 MG/2ML IJ SOLN: 4.0000 mg | Freq: Four times a day (QID) | INTRAMUSCULAR | Status: DC | PRN

## 2019-07-15 MED ORDER — METHYLPREDNISOLONE SODIUM SUCC 40 MG IJ SOLR: 40.0000 mg | Freq: Four times a day (QID) | INTRAMUSCULAR | Status: DC

## 2019-07-15 NOTE — ED Provider Notes (Signed)
[facility] EMERGENCY DEPARTMENT Provider Note   CSN: [id number] Arrival date & time: 07/15/19  1819     History Chief Complaint  Patient presents with  . Shortness of Breath    Heather Newman is a 79 y.o. female.  HPI    Patient is a 79 year old female with a history of Alzheimer's disease, CAD, COPD on chronic O2, GERD, cirrhosis, hyperlipidemia, OSA, paroxysmal atrial fibrillation, diabetes, who presents the emergency department today for evaluation of shortness of breath, and wheezing for the last several days.  Patient reports that she feels short of breath.  She denies any chest pain.  She has not had any fevers.  She denies abdominal pain nausea or vomiting.  Denies diarrhea.  She is demented at baseline.  Past Medical History:  Diagnosis Date  . Alzheimer's disease ([place])   . 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 Bilateral primary osteoarthritis of knee   . CAD (coronary artery disease)   . Calculus of ureter   . COPD (chronic obstructive pulmonary disease) ([place])   . Depression   . Displaced spiral fracture of shaft of left femur, subsequent encounter for closed fracture with routine healing   . Essential hypertension   . GERD (gastroesophageal reflux disease)   . Hepatic cirrhosis ([place])   . Hyperlipidemia   . LBBB (left bundle branch block)   . Melanocytic nevi, unspecified   . OSA (obstructive sleep apnea)   . PAF (paroxysmal atrial fibrillation) ([place])   . Type 2 diabetes mellitus ([place])   . Vitamin D deficiency     Patient Active Problem List   Diagnosis Date Noted  . Atrial flutter with rapid ventricular response ([place])   . History of cardiomyopathy   . Pressure injury of skin 05/13/2019  . Acute respiratory failure with hypoxia and hypercapnia ([place]) 05/12/2019  . COPD exacerbation ([place]) 05/12/2019  . Pressure injury of skin of left buttock 04/03/2019  . Pressure injury of heel, stage 2 ([place])  04/03/2019  . Diabetes mellitus type 2 with complications ([place]) 44/05/4740  . Nail overgrowth 03/29/2019  . Pressure injury of left heel, stage 2 ([place]) 03/29/2019  . Obstructive sleep apnea (adult) (pediatric)   . Altered mental status, unspecified   . Atherosclerosis of native arteries of right leg with ulceration of other part of foot ([place])   . Atrial premature depolarization   . Calculus of ureter   . Chronic systolic (congestive) heart failure ([place])   . Cognitive communication deficit   . Dementia in other diseases classified elsewhere without behavioral disturbance ([place])   . Emphysema, unspecified ([place])   . Immobility syndrome (paraplegic)   . Left bundle-branch block, unspecified   . Major depressive disorder, single episode, unspecified   . Acute metabolic encephalopathy   . Other disorders of peripheral nervous system   . Other hypersomnia   . Other symptoms and signs concerning food and fluid intake   . Pain, unspecified   . Sleep disorder   . Stereotyped movement disorders   . Unspecified atrial fibrillation ([place])   . Alzheimer's disease, unspecified (CODE) ([place])   . Coronary artery disease of native artery of native heart with stable angina pectoris ([place])   . Bilateral primary osteoarthritis of knee   . Circadian rhythm sleep disorder, advanced sleep phase type   . Constipation, unspecified   . Difficulty in walking, not elsewhere classified   . Insomnia due to medical condition   .  Erythematous condition, unspecified   . Localized edema   . Melanocytic nevi, unspecified   . Other cirrhosis of liver ([place])   . Other fatigue   . Snoring   . Other symptoms and signs involving the musculoskeletal system   . Vitamin D deficiency, unspecified     Past Surgical History:  Procedure Laterality Date  . CHOLECYSTECTOMY    . SPINE SURGERY    . STRABISMUS SURGERY       OB History   No obstetric history on file.     Family History  Problem Relation Age of Onset  .  Diabetes Mellitus II Mother   . Pneumonia Father     Social History   Tobacco Use  . Smoking status: Current Every Day Smoker    Types: Cigarettes  . Smokeless tobacco: Never Used  Substance Use Topics  . Alcohol use: Never  . Drug use: Never    Home Medications Prior to Admission medications   Medication Sig Start Date End Date Taking? Authorizing Provider  UNABLE TO FIND Depends Diapers up to 72" 120/mth 03/28/19  Yes [name], NP  Vitamin D, Ergocalciferol, (DRISDOL) 1.25 MG (50000 UNIT) CAPS capsule Take 1 capsule (50,000 Units total) by mouth every 7 (seven) days. Patient taking differently: Take 50,000 Units by mouth every Monday.  04/09/19  Yes Fayrene Helper, MD  acetaminophen (TYLENOL) 325 MG tablet Take 325-650 mg by mouth every 6 (six) hours as needed for mild pain or moderate pain.    [provider]  albuterol (PROVENTIL) (2.5 MG/3ML) 0.083% nebulizer solution Take 3 mLs (2.5 mg total) by nebulization every 6 (six) hours as needed for wheezing or shortness of breath. 03/28/19   [name], NP  aspirin EC 81 MG tablet Take 1 tablet (81 mg total) by mouth daily with breakfast. 05/16/19   Roxan Hockey, MD  bisacodyl (DULCOLAX) 5 MG EC tablet Take 5 mg by mouth daily as needed for moderate constipation.    [provider]  blood glucose meter kit and supplies Dispense based on patient and insurance preference. Use up to four times daily as directed. (FOR ICD-10 E10.9, E11.9). Patient not taking: Reported on 05/12/2019 03/28/19   [name], NP  diltiazem (CARDIZEM CD) 240 MG 24 hr capsule Take 1 capsule (240 mg total) by mouth daily. For Heart 05/17/19   Roxan Hockey, MD  famotidine (PEPCID) 20 MG tablet Take 20 mg by mouth 2 (two) times daily.    [provider]  fluticasone furoate-vilanterol (BREO ELLIPTA) 100-25 MCG/INH AEPB Inhale 1 puff into the lungs in the morning and at bedtime. 05/15/19   [name], NP    furosemide (LASIX) 20 MG tablet Take 1 tablet (20 mg total) by mouth daily. 05/16/19   Roxan Hockey, MD  gabapentin (NEURONTIN) 300 MG capsule Take 1 capsule (300 mg total) by mouth 2 (two) times daily. 05/16/19   Roxan Hockey, MD  insulin aspart (NOVOLOG) 100 UNIT/ML FlexPen Inject 0-6 Units into the skin See admin instructions. 1) follow-up with cardiologist Dr. Domenic Polite in 2 to 3 weeks for recheck 2)insulin aspart (novoLOG) injection 0- 6 Units 0- 6 Units Subcutaneous, 3 times daily with meals CBG < 70: Implement Hypoglycemia Standing Orders and refer to Hypoglycemia Standing Orders sidebar report  CBG 70 - 120: 0 unit CBG 121 - 150: 0 unit  CBG 151 - 200: 0 unit CBG 201 - 250: 1 units CBG 251 - 300: 2 units CBG 301 -  350: 4 units  CBG 351 - 400: 6 units  CBG > 400: 6 units 05/16/19   Emokpae, Courage, MD  insulin glargine (LANTUS) 100 UNIT/ML injection Inject 0.1 mLs (10 Units total) into the skin at bedtime. 05/16/19   Emokpae, Courage, MD  ipratropium-albuterol (DUONEB) 0.5-2.5 (3) MG/3ML SOLN Take 3 mLs by nebulization 2 (two) times daily. 03/29/19   Fayrene Helper, MD  lactulose (CHRONULAC) 10 GM/15ML solution Take 30 mLs (20 g total) by mouth 2 (two) times daily. 05/16/19   Roxan Hockey, MD  magnesium hydroxide (MILK OF MAGNESIA) 400 MG/5ML suspension Take 30 mLs by mouth daily as needed for mild constipation.    [provider]  metFORMIN (GLUCOPHAGE) 500 MG tablet Take 1 tablet (500 mg total) by mouth 2 (two) times daily with a meal. 05/16/19   Emokpae, Courage, MD  metoprolol succinate (TOPROL XL) 50 MG 24 hr tablet Take 1 tablet (50 mg total) by mouth daily. 05/16/19 05/15/20  Roxan Hockey, MD  mirtazapine (REMERON) 15 MG tablet Take 1 tablet (15 mg total) by mouth at bedtime. 05/01/19   [name], NP  mometasone-formoterol (DULERA) 100-5 MCG/ACT AERO Inhale 2 puffs into the lungs daily. 05/17/19   Roxan Hockey, MD  nicotine (NICODERM CQ - DOSED IN MG/24 HOURS)  21 mg/24hr patch Place 1 patch (21 mg total) onto the skin daily. 05/17/19   Roxan Hockey, MD  pravastatin (PRAVACHOL) 10 MG tablet Take 1 tablet (10 mg total) by mouth daily. Patient taking differently: Take 10 mg by mouth at bedtime.  04/09/19   Fayrene Helper, MD  predniSONE (DELTASONE) 20 MG tablet Take 2 tablets (40 mg total) by mouth daily with breakfast. 05/16/19   Roxan Hockey, MD  Karen Kays TO [place] pads 60/mth Patient not taking: Reported on 05/12/2019 03/28/19   [name], NP    Allergies    Patient has no known allergies.  Review of Systems   Review of Systems  Constitutional: Negative for fever.  HENT: Negative for ear pain and sore throat.   Eyes: Negative for visual disturbance.  Respiratory: Positive for cough, shortness of breath and wheezing.   Cardiovascular: Negative for chest pain.  Gastrointestinal: Negative for abdominal pain, constipation, diarrhea, nausea and vomiting.  Genitourinary: Negative for dysuria.  Musculoskeletal: Negative for back pain.  Skin: Negative for color change and rash.  Neurological: Negative for headaches.  All other systems reviewed and are negative.   Physical Exam Updated Vital Signs BP 117/74   Pulse 94   Temp (!) 102.1 F (38.9 C) (Rectal)   Resp (!) 22   Ht _0  (1.727 m)   Wt 80.3 kg   SpO2 94%   BMI 26.91 kg/m   Physical Exam Vitals and nursing note reviewed.  Constitutional:      General: She is not in acute distress.    Appearance: She is well-developed.  HENT:     Head: Normocephalic and atraumatic.  Eyes:     Conjunctiva/sclera: Conjunctivae normal.  Cardiovascular:     Rate and Rhythm: Normal rate and regular rhythm.     Heart sounds: No murmur.  Pulmonary:     Effort: Tachypnea (in the 30s-40) and accessory muscle usage present. No respiratory distress.     Breath sounds: Decreased breath sounds and wheezing present.  Abdominal:     Palpations: Abdomen is soft.     Tenderness: There  is no abdominal tenderness. There is no guarding or rebound.  Musculoskeletal:  Cervical back: Neck supple.  Skin:    General: Skin is warm and dry.  Neurological:     Mental Status: She is alert.     Comments: Pleasantly demented     ED Results / Procedures / Treatments   Labs (all labs ordered are listed, but only abnormal results are displayed) Labs Reviewed  CBC WITH DIFFERENTIAL/PLATELET - Abnormal; Notable for the following components:      Result Value   Platelets 135 (*)    Lymphs Abs 0.5 (*)    All other components within normal limits  COMPREHENSIVE METABOLIC PANEL - Abnormal; Notable for the following components:   Glucose, Bld 147 (*)    Creatinine, Ser 0.42 (*)    Calcium 8.7 (*)    Total Protein 6.1 (*)    Albumin 2.8 (*)    AST 69 (*)    ALT 67 (*)    All other components within normal limits  CULTURE, BLOOD (ROUTINE X 2)  CULTURE, BLOOD (ROUTINE X 2)  RESPIRATORY PANEL BY RT PCR (FLU A&B, COVID)  URINE CULTURE  LACTIC ACID, PLASMA  URINALYSIS, ROUTINE W REFLEX MICROSCOPIC  APTT  PROTIME-INR  LACTIC ACID, PLASMA  POC SARS CORONAVIRUS 2 AG -  ED  TROPONIN I (HIGH SENSITIVITY)    EKG EKG Interpretation  Date/Time:  _1  y/o F with a h/o dementia presenting for eval of sob, cough, and wheezing.   Pt febrile. Not tachycardic, normotensive. Pt not hypoxic on her normal requirement but she is tachypneic with accessory muscle use. She has wheezing on exam.   COVID is neg.  With tachycardia, tachypnea, fever and suspected upper respiratory infection, code sepsis initiated. Ceftriaxone and azithromycin ordered due to concern for pneumonia.   CBC is without leukocytosis or anemia CMP with normal electrolytes.  Normal kidney function.  Liver enzymes are slightly elevated with normal bilirubin Lactic acid is negative Coags are negative Troponin is negative UA negative for UTI  EKG with atrial flutter LVH with secondary repolarization abnormality Anterior infarct, old No STEMI  - has h/o same  CXR with  cardiomegaly, lingular atelectasis  At shift change, care transitioned to Wyn Quaker, PA-C with plan to admit the patient for further tx.   Final Clinical Impression(s) / ED Diagnoses Final diagnoses:  None    Rx / DC Orders ED Discharge Orders    None       Bishop Dublin 07/15/19 2214    Wyvonnia Dusky, MD 07/16/19 1220

## 2019-07-15 NOTE — ED Notes (Signed)
April RN @ [facility] updated on pt care & that pt would be admitted to the hospital

## 2019-07-15 NOTE — H&P (Signed)
History and Physical    [name] BHA:[id number] DOB: 03-Jun-1940 DOA: 07/15/2019  PCP: Heather Mayo, NP   Patient coming from: Home.  I have personally briefly reviewed patient's old medical records in [place]  Chief Complaint: Shortness of breath and wheezing.  HPI: Heather Newman is a 79 y.o. 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  She is able to answer simple questions, but is not able to elaborate much about her present illness.  She denies having headache, sore throat, chest pain, abdominal or joint pain.  She states she has chronic back pain, but her back is not particularly hurting at this time.  ED Course: Initial vital signs temperature 99.8 F, pulse 90, respirations 23, blood pressure 132/72 mmHg and O2 sat 100% on nasal cannula oxygen at 4 LPM.  An hour plus after arriving to the ER the patient had 102.1 F fever.  SARS antigen and PCR was negative.  Influenza a and B by PCR was negative.  Her urinalysis was normal.  CBC shows a white count of 8.3 with 81% neutrophils, 6% lymphocytes and 10% monocytes.  Hemoglobin 12.6 g/dL and platelets 135.  PT 14.3, INR 1.1 and PTT 32.  Lactic acid was 1.8 mmol/L.  CMP shows normal electrolytes when calcium is corrected to albumin.  Renal function was normal.  Glucose 147 mg/dL.  Total protein 6.1, albumin two-point a grams per deciliter.  AST was 69 and ALT 67 units/L.  Review of Systems: As per HPI otherwise all other systems reviewed and are negative.  Past Medical History:  Diagnosis Date  .  Alzheimer's disease ([place])   . Atherosclerosis of native arteries of right leg with ulceration of other part of foot ([place])   . Bilateral primary osteoarthritis of knee   . CAD (coronary artery disease)   . Calculus of ureter   . COPD (chronic obstructive pulmonary disease) ([place])   . Depression   . Displaced spiral fracture of shaft of left femur, subsequent encounter for closed fracture with routine healing   . Essential hypertension   . GERD (gastroesophageal reflux disease)   . Hepatic cirrhosis ([place])   . Hyperlipidemia   . LBBB (left bundle branch block)   . Melanocytic nevi, unspecified   . OSA (obstructive sleep apnea)   . PAF (paroxysmal atrial fibrillation) ([place])   . Type 2 diabetes mellitus ([place])   . Vitamin D deficiency     Past Surgical History:  Procedure Laterality Date  . CHOLECYSTECTOMY    . SPINE SURGERY    . STRABISMUS SURGERY      Social History  reports that she has been smoking cigarettes. She has never used smokeless tobacco. She reports that she does not drink alcohol or use drugs.  No Known Allergies  Family History  Problem Relation Age of Onset  . Diabetes Mellitus II Mother   . Pneumonia Father    Prior to Admission medications   Medication Sig Start Date End Date Taking? Authorizing Provider  UNABLE TO FIND Depends Diapers up to 72" 120/mth 03/28/19  Yes Heather Mayo, NP  Vitamin D, Ergocalciferol, (DRISDOL) 1.25 MG (50000 UNIT) CAPS capsule Take  1 capsule (50,000 Units total) by mouth every 7 (seven) days. Patient taking differently: Take 50,000 Units by mouth every Monday.  04/09/19  Yes Fayrene Helper, MD  acetaminophen (TYLENOL) 325 MG tablet Take 325-650 mg by mouth every 6 (six) hours as needed for mild pain or moderate pain.    [provider]  albuterol (PROVENTIL) (2.5 MG/3ML) 0.083% nebulizer solution Take 3 mLs (2.5 mg total) by nebulization every 6 (six) hours as needed for wheezing or shortness of breath. 03/28/19    Heather Mayo, NP  aspirin EC 81 MG tablet Take 1 tablet (81 mg total) by mouth daily with breakfast. 05/16/19   Roxan Hockey, MD  bisacodyl (DULCOLAX) 5 MG EC tablet Take 5 mg by mouth daily as needed for moderate constipation.    [provider]  blood glucose meter kit and supplies Dispense based on patient and insurance preference. Use up to four times daily as directed. (FOR ICD-10 E10.9, E11.9). Patient not taking: Reported on 05/12/2019 03/28/19   Heather Mayo, NP  diltiazem (CARDIZEM CD) 240 MG 24 hr capsule Take 1 capsule (240 mg total) by mouth daily. For Heart 05/17/19   Roxan Hockey, MD  famotidine (PEPCID) 20 MG tablet Take 20 mg by mouth 2 (two) times daily.    [provider]  fluticasone furoate-vilanterol (BREO ELLIPTA) 100-25 MCG/INH AEPB Inhale 1 puff into the lungs in the morning and at bedtime. 05/15/19   Heather Mayo, NP  furosemide (LASIX) 20 MG tablet Take 1 tablet (20 mg total) by mouth daily. 05/16/19   Roxan Hockey, MD  gabapentin (NEURONTIN) 300 MG capsule Take 1 capsule (300 mg total) by mouth 2 (two) times daily. 05/16/19   Roxan Hockey, MD  insulin aspart (NOVOLOG) 100 UNIT/ML FlexPen Inject 0-6 Units into the skin See admin instructions. 1) follow-up with cardiologist Dr. Domenic Polite in 2 to 3 weeks for recheck 2)insulin aspart (novoLOG) injection 0- 6 Units 0- 6 Units Subcutaneous, 3 times daily with meals CBG < 70: Implement Hypoglycemia Standing Orders and refer to Hypoglycemia Standing Orders sidebar report  CBG 70 - 120: 0 unit CBG 121 - 150: 0 unit  CBG 151 - 200: 0 unit CBG 201 - 250: 1 units CBG 251 - 300: 2 units CBG 301 - 350: 4 units  CBG 351 - 400: 6 units  CBG > 400: 6 units 05/16/19   Emokpae, Courage, MD  insulin glargine (LANTUS) 100 UNIT/ML injection Inject 0.1 mLs (10 Units total) into the skin at bedtime. 05/16/19   Emokpae, Courage, MD  ipratropium-albuterol (DUONEB) 0.5-2.5 (3) MG/3ML SOLN Take 3 mLs by nebulization 2  (two) times daily. 03/29/19   Fayrene Helper, MD  lactulose (CHRONULAC) 10 GM/15ML solution Take 30 mLs (20 g total) by mouth 2 (two) times daily. 05/16/19   Roxan Hockey, MD  magnesium hydroxide (MILK OF MAGNESIA) 400 MG/5ML suspension Take 30 mLs by mouth daily as needed for mild constipation.    [provider]  metFORMIN (GLUCOPHAGE) 500 MG tablet Take 1 tablet (500 mg total) by mouth 2 (two) times daily with a meal. 05/16/19   Emokpae, Courage, MD  metoprolol succinate (TOPROL XL) 50 MG 24 hr tablet Take 1 tablet (50 mg total) by mouth daily. 05/16/19 05/15/20  Roxan Hockey, MD  mirtazapine (REMERON) 15 MG tablet Take 1 tablet (15 mg total) by mouth at bedtime. 05/01/19   Heather Mayo, NP  mometasone-formoterol (DULERA) 100-5 MCG/ACT AERO Inhale 2 puffs into the  lungs daily. 05/17/19   Roxan Hockey, MD  nicotine (NICODERM CQ - DOSED IN MG/24 HOURS) 21 mg/24hr patch Place 1 patch (21 mg total) onto the skin daily. 05/17/19   Roxan Hockey, MD  pravastatin (PRAVACHOL) 10 MG tablet Take 1 tablet (10 mg total) by mouth daily. Patient taking differently: Take 10 mg by mouth at bedtime.  04/09/19   Fayrene Helper, MD  predniSONE (DELTASONE) 20 MG tablet Take 2 tablets (40 mg total) by mouth daily with breakfast. 05/16/19   Roxan Hockey, MD  UNABLE TO FIND Chuck pads 60/mth Patient not taking: Reported on 05/12/2019 03/28/19   Heather Mayo, NP    Physical Exam: Vitals:   07/15/19 2230 07/15/19 2234 07/15/19 2250 07/15/19 2300  BP: 112/74   124/75  Pulse: 99   (!) 103  Resp: (!) 25   17  Temp:      TempSrc:      SpO2: 94% 94% 95% 95%  Weight:      Height:        Constitutional: NAD, calm, comfortable Eyes: PERRL, lids and conjunctivae normal ENMT: Mucous membranes are moist. Posterior pharynx clear of any exudate or lesions. Neck: normal, supple, no masses, no thyromegaly Respiratory: Decreased interstitial breath sounds with rhonchi and wheezing, no  crackles.  Mildly tachypneic in the low to mid 20s. No accessory muscle use.  Cardiovascular: Regular rate and rhythm, no murmurs / rubs / gallops. No extremity edema. 2+ pedal pulses. No carotid bruits.  Abdomen: Nondistended.  BS positive.  Soft, no tenderness, no masses palpated. No hepatosplenomegaly. Musculoskeletal: no clubbing / cyanosis.  Good ROM, no contractures. Normal muscle tone.  Skin: Stage I pressure ulcer Neurologic: CN 2-12 grossly intact. Sensation intact, DTR normal. Strength 5/5 in all 4.  Psychiatric: alert and oriented x 1, knows that she is in the hospital and the she is now breathing well.  Disoriented to time, date.. Normal mood.   Labs on Admission: I have personally reviewed following labs and imaging studies  CBC: Recent Labs  Lab 07/15/19 2119  WBC 8.3  NEUTROABS 6.8  HGB 12.6  HCT 39.0  MCV 97.3  PLT 135*    Basic Metabolic Panel: Recent Labs  Lab 07/15/19 2119  NA 140  K 3.7  CL 101  CO2 28  GLUCOSE 147*  BUN 11  CREATININE 0.42*  CALCIUM 8.7*  MG 1.8  PHOS 2.4*    GFR: Estimated Creatinine Clearance: 65.5 mL/min (A) (by C-G formula based on SCr of 0.42 mg/dL (L)).  Liver Function Tests: Recent Labs  Lab 07/15/19 2119  AST 69*  ALT 67*  ALKPHOS 108  BILITOT 1.2  PROT 6.1*  ALBUMIN 2.8*    Urine analysis:    Component Value Date/Time   COLORURINE YELLOW 07/15/2019 2139   APPEARANCEUR CLEAR 07/15/2019 2139   LABSPEC 1.013 07/15/2019 2139   PHURINE 5.0 07/15/2019 2139   GLUCOSEU NEGATIVE 07/15/2019 2139   HGBUR NEGATIVE 07/15/2019 2139   BILIRUBINUR NEGATIVE 07/15/2019 2139   [place] NEGATIVE 07/15/2019 2139   PROTEINUR NEGATIVE 07/15/2019 2139   NITRITE NEGATIVE 07/15/2019 2139   LEUKOCYTESUR NEGATIVE 07/15/2019 2139    Radiological Exams on Admission: DG Chest Portable 1 View  Result Date: 07/15/2019 CLINICAL DATA:  Shortness of breath EXAM: PORTABLE CHEST 1 VIEW COMPARISON:  05/12/2019 FINDINGS: Mild  cardiomegaly. Lingular atelectasis. No effusions or overt edema. No acute bony abnormality. IMPRESSION: Cardiomegaly, lingular atelectasis. Electronically Signed   By: Rolm Baptise M.D.  On: 07/15/2019 19:19   05/16/2019 echocardiogram IMPRESSIONS   1. Left ventricular ejection fraction, by estimation, is 55%. The left  ventricle has low normal function. The left ventricle has no regional wall  motion abnormalities. There is mild asymmetric left ventricular  hypertrophy of the septal segment. Left  ventricular diastolic parameters are indeterminate. Elevated left  ventricular end-diastolic pressure.  2. Right ventricular systolic function is normal. The right ventricular  size is normal. There is moderately elevated pulmonary artery systolic  pressure.  3. Left atrial size was severely dilated.  4. Right atrial size was moderately dilated.  5. The mitral valve is degenerative. Mild mitral valve regurgitation.  6. The aortic valve is tricuspid. Aortic valve regurgitation is not  visualized. Mild aortic valve sclerosis is present, with no evidence of  aortic valve stenosis.  7. The inferior vena cava is dilated in size with >50% respiratory  variability, suggesting right atrial pressure of 8 mmHg.   EKG: Independently reviewed.  Vent. rate 103 BPM PR interval * ms QRS duration 127 ms QT/QTc 345/452 ms P-R-T axes * 67 140 Atrial flutter LVH with secondary repolarization abnormality Anterior infarct, old  Assessment/Plan Principal Problem:   Acute on chronic respiratory failure with hypoxia (HCC)   COPD exacerbation ([place]) Initially very tachypneic with accessory muscle use. Much improved after treatment. Observation/stepdown Continue supplemental oxygen. Continue bronchodilators. Continue glucocorticoids BiPAP ventilation mode as needed.  Active Problems:   Fever No obvious source. Procalcitonin is normal. Will hold IV antibiotics for now. Follow blood culture  and sensitivity.    Chronic systolic (congestive) heart failure (HCC) No signs of fluid overload. EF has normalized on most recent echo.    PAF (paroxysmal atrial fibrillation) (HCC) CHA?DS?-VASc Score of them. Optimize electrolytes. Not on anticoagulation. Continue aspirin. Continue Cardizem and metoprolol    CAD (coronary artery disease) Continue aspirin, beta-blocker and pravastatin.    Essential hypertension On Cardizem and metoprolol. Held furosemide. Monitor BP and heart rate.    Diabetes mellitus type 2 with complications (HCC) Carbohydrate modified diet. Continue Metformin 500 mg p.o. twice daily. Continue Lantus 10 units at bedtime. CBG monitoring with RI SS.    Hypophosphatemia Replacement ordered.    Other cirrhosis of liver (HCC) Mild elevation of transaminases. Will check ammonia level with next blood draw.    Alzheimer's disease, unspecified (CODE) ([place]) Continue Remeron at bedtime. Supportive care.   DVT prophylaxis:.  SCDs. Code Status:   Full code. Family Communication: Disposition Plan:   Patient is from:  [facility].  Anticipated DC to:  [facility].  Anticipated DC date:  07/17/2019.  Anticipated DC barriers: Clinical improvement.  Consults called: Admission status:  Observation/stepdown.    Severity of Illness: Medium to high severity.  Reubin Milan MD [facility]  How to contact the [facility] Attending or Consulting provider [place] or covering provider during after hours [place], for this patient?   1. Check the care team in [facility] and look for a) attending/consulting TRH provider listed and b) the [facility] team listed 2. Log into [email or web address] and use [facility]'s universal password to access. If you do not have the password, please contact the hospital operator. 3. Locate the [facility] provider you are looking for under [facility] and page to a number that you can be directly  reached. 4. If you still have difficulty reaching the provider, please page the [facility] (Director on Call) for the Hospitalists listed on amion for  assistance.  07/15/2019, 11:48 PM   This document was prepared using Dragon voice recognition software and may contain some unintended transcription errors.

## 2019-07-15 NOTE — ED Triage Notes (Signed)
Pt brought to ED via [facility] from [place] for SOB and wheezing x couple of days. Pt chronically on 4L of O2. Pt denies cough.

## 2019-07-16 DIAGNOSIS — I480 Paroxysmal atrial fibrillation: Secondary | ICD-10-CM | POA: Diagnosis present

## 2019-07-16 DIAGNOSIS — J441 Chronic obstructive pulmonary disease with (acute) exacerbation: Secondary | ICD-10-CM | POA: Diagnosis not present

## 2019-07-16 DIAGNOSIS — Z833 Family history of diabetes mellitus: Secondary | ICD-10-CM | POA: Diagnosis not present

## 2019-07-16 DIAGNOSIS — G8220 Paraplegia, unspecified: Secondary | ICD-10-CM | POA: Diagnosis present

## 2019-07-16 DIAGNOSIS — K219 Gastro-esophageal reflux disease without esophagitis: Secondary | ICD-10-CM | POA: Diagnosis present

## 2019-07-16 DIAGNOSIS — F0280 Dementia in other diseases classified elsewhere without behavioral disturbance: Secondary | ICD-10-CM | POA: Diagnosis present

## 2019-07-16 DIAGNOSIS — I5022 Chronic systolic (congestive) heart failure: Secondary | ICD-10-CM | POA: Diagnosis present

## 2019-07-16 DIAGNOSIS — E118 Type 2 diabetes mellitus with unspecified complications: Secondary | ICD-10-CM

## 2019-07-16 DIAGNOSIS — I2510 Atherosclerotic heart disease of native coronary artery without angina pectoris: Secondary | ICD-10-CM

## 2019-07-16 DIAGNOSIS — J479 Bronchiectasis, uncomplicated: Secondary | ICD-10-CM | POA: Diagnosis present

## 2019-07-16 DIAGNOSIS — J439 Emphysema, unspecified: Secondary | ICD-10-CM | POA: Diagnosis present

## 2019-07-16 DIAGNOSIS — G4733 Obstructive sleep apnea (adult) (pediatric): Secondary | ICD-10-CM | POA: Diagnosis present

## 2019-07-16 DIAGNOSIS — I447 Left bundle-branch block, unspecified: Secondary | ICD-10-CM | POA: Diagnosis present

## 2019-07-16 DIAGNOSIS — F17210 Nicotine dependence, cigarettes, uncomplicated: Secondary | ICD-10-CM | POA: Diagnosis present

## 2019-07-16 DIAGNOSIS — E119 Type 2 diabetes mellitus without complications: Secondary | ICD-10-CM | POA: Diagnosis present

## 2019-07-16 DIAGNOSIS — E559 Vitamin D deficiency, unspecified: Secondary | ICD-10-CM | POA: Diagnosis present

## 2019-07-16 DIAGNOSIS — Z9981 Dependence on supplemental oxygen: Secondary | ICD-10-CM | POA: Diagnosis not present

## 2019-07-16 DIAGNOSIS — J9621 Acute and chronic respiratory failure with hypoxia: Secondary | ICD-10-CM | POA: Diagnosis present

## 2019-07-16 DIAGNOSIS — I10 Essential (primary) hypertension: Secondary | ICD-10-CM

## 2019-07-16 DIAGNOSIS — G309 Alzheimer's disease, unspecified: Secondary | ICD-10-CM | POA: Diagnosis present

## 2019-07-16 DIAGNOSIS — R509 Fever, unspecified: Secondary | ICD-10-CM

## 2019-07-16 DIAGNOSIS — Z20822 Contact with and (suspected) exposure to covid-19: Secondary | ICD-10-CM | POA: Diagnosis present

## 2019-07-16 DIAGNOSIS — R0602 Shortness of breath: Secondary | ICD-10-CM | POA: Diagnosis present

## 2019-07-16 DIAGNOSIS — I4892 Unspecified atrial flutter: Secondary | ICD-10-CM | POA: Diagnosis present

## 2019-07-16 DIAGNOSIS — I482 Chronic atrial fibrillation, unspecified: Secondary | ICD-10-CM | POA: Diagnosis present

## 2019-07-16 DIAGNOSIS — I110 Hypertensive heart disease with heart failure: Secondary | ICD-10-CM | POA: Diagnosis present

## 2019-07-16 DIAGNOSIS — K7469 Other cirrhosis of liver: Secondary | ICD-10-CM

## 2019-07-16 DIAGNOSIS — E785 Hyperlipidemia, unspecified: Secondary | ICD-10-CM | POA: Diagnosis present

## 2019-07-16 DIAGNOSIS — M170 Bilateral primary osteoarthritis of knee: Secondary | ICD-10-CM | POA: Diagnosis present

## 2019-07-16 LAB — GLUCOSE, CAPILLARY
Glucose-Capillary: 248 mg/dL — ABNORMAL HIGH (ref 70–99)
Glucose-Capillary: 372 mg/dL — ABNORMAL HIGH (ref 70–99)
Glucose-Capillary: 289 mg/dL — ABNORMAL HIGH (ref 70–99)
Glucose-Capillary: 245 mg/dL — ABNORMAL HIGH (ref 70–99)

## 2019-07-16 LAB — AMMONIA: Ammonia: 80 umol/L — ABNORMAL HIGH (ref 9–35)

## 2019-07-16 LAB — MRSA PCR SCREENING: MRSA by PCR: NEGATIVE

## 2019-07-16 MED ORDER — MAGNESIUM HYDROXIDE 400 MG/5ML PO SUSP: 30.0000 mL | Freq: Every day | ORAL | Status: DC | PRN

## 2019-07-16 MED ORDER — FAMOTIDINE 20 MG PO TABS
20.0000 mg | ORAL_TABLET | Freq: Two times a day (BID) | ORAL | Status: DC
  Administered 2019-07-16 – 2019-07-17 (×3): 20 mg via ORAL
  Filled 2019-07-16 (×3): qty 1

## 2019-07-16 MED ORDER — LACTULOSE 10 GM/15ML PO SOLN
20.0000 g | Freq: Two times a day (BID) | ORAL | Status: DC
  Administered 2019-07-16 – 2019-07-17 (×3): 20 g via ORAL
  Filled 2019-07-16 (×3): qty 30

## 2019-07-16 MED ORDER — VITAMIN D (ERGOCALCIFEROL) 1.25 MG (50000 UNIT) PO CAPS
50000.0000 [IU] | ORAL_CAPSULE | ORAL | Status: DC
  Administered 2019-07-16: 50000 [IU] via ORAL
  Filled 2019-07-16: qty 1

## 2019-07-16 MED ORDER — ASPIRIN EC 81 MG PO TBEC
81.0000 mg | DELAYED_RELEASE_TABLET | Freq: Every day | ORAL | Status: DC
  Administered 2019-07-16 – 2019-07-17 (×2): 81 mg via ORAL
  Filled 2019-07-16 (×2): qty 1

## 2019-07-16 MED ORDER — INSULIN GLARGINE 100 UNIT/ML ~~LOC~~ SOLN
12.0000 [IU] | Freq: Every day | SUBCUTANEOUS | Status: DC
  Administered 2019-07-16: 12 [IU] via SUBCUTANEOUS
  Filled 2019-07-16 (×2): qty 0.12

## 2019-07-16 MED ORDER — MIRTAZAPINE 15 MG PO TABS
15.0000 mg | ORAL_TABLET | Freq: Every day | ORAL | Status: DC
  Administered 2019-07-16: 15 mg via ORAL
  Filled 2019-07-16: qty 1

## 2019-07-16 MED ORDER — GABAPENTIN 300 MG PO CAPS
300.0000 mg | ORAL_CAPSULE | Freq: Two times a day (BID) | ORAL | Status: DC
  Administered 2019-07-16 – 2019-07-17 (×3): 300 mg via ORAL
  Filled 2019-07-16 (×3): qty 1

## 2019-07-16 MED ORDER — METOPROLOL SUCCINATE ER 50 MG PO TB24
50.0000 mg | ORAL_TABLET | Freq: Every day | ORAL | Status: DC
  Administered 2019-07-16 – 2019-07-17 (×2): 50 mg via ORAL
  Filled 2019-07-16 (×2): qty 1

## 2019-07-16 MED ORDER — METFORMIN HCL 500 MG PO TABS: 500.0000 mg | ORAL_TABLET | Freq: Two times a day (BID) | ORAL | Status: DC

## 2019-07-16 MED ORDER — BUDESONIDE 0.5 MG/2ML IN SUSP
RESPIRATORY_TRACT | Status: AC
  Filled 2019-07-16: qty 2

## 2019-07-16 MED ORDER — FUROSEMIDE 20 MG PO TABS
20.0000 mg | ORAL_TABLET | Freq: Every day | ORAL | Status: DC
  Administered 2019-07-17: 20 mg via ORAL
  Filled 2019-07-16: qty 1

## 2019-07-16 MED ORDER — ORAL CARE MOUTH RINSE
15.0000 mL | Freq: Two times a day (BID) | OROMUCOSAL | Status: DC
  Administered 2019-07-17: 15 mL via OROMUCOSAL

## 2019-07-16 MED ORDER — ALBUTEROL SULFATE (2.5 MG/3ML) 0.083% IN NEBU: 2.5000 mg | INHALATION_SOLUTION | RESPIRATORY_TRACT | Status: DC | PRN

## 2019-07-16 MED ORDER — IPRATROPIUM-ALBUTEROL 0.5-2.5 (3) MG/3ML IN SOLN
3.0000 mL | Freq: Two times a day (BID) | RESPIRATORY_TRACT | Status: DC
  Administered 2019-07-16 – 2019-07-17 (×2): 3 mL via RESPIRATORY_TRACT
  Filled 2019-07-16 (×2): qty 3

## 2019-07-16 MED ORDER — INSULIN GLARGINE 100 UNIT/ML ~~LOC~~ SOLN
10.0000 [IU] | Freq: Every day | SUBCUTANEOUS | Status: DC
  Filled 2019-07-16: qty 0.1

## 2019-07-16 MED ORDER — BUDESONIDE 0.5 MG/2ML IN SUSP
0.5000 mg | Freq: Two times a day (BID) | RESPIRATORY_TRACT | Status: DC
  Administered 2019-07-16 – 2019-07-17 (×3): 0.5 mg via RESPIRATORY_TRACT
  Filled 2019-07-16 (×2): qty 2

## 2019-07-16 MED ORDER — DILTIAZEM HCL ER COATED BEADS 240 MG PO CP24
240.0000 mg | ORAL_CAPSULE | Freq: Every day | ORAL | Status: DC
  Administered 2019-07-16 – 2019-07-17 (×2): 240 mg via ORAL
  Filled 2019-07-16 (×2): qty 1

## 2019-07-16 MED ORDER — PRAVASTATIN SODIUM 10 MG PO TABS
10.0000 mg | ORAL_TABLET | Freq: Every day | ORAL | Status: DC
  Administered 2019-07-16: 21:00:00 10 mg via ORAL
  Filled 2019-07-16: qty 1

## 2019-07-16 MED ORDER — METHYLPREDNISOLONE SODIUM SUCC 40 MG IJ SOLR
40.0000 mg | Freq: Three times a day (TID) | INTRAMUSCULAR | Status: DC
  Administered 2019-07-16 – 2019-07-17 (×5): 40 mg via INTRAVENOUS
  Filled 2019-07-16 (×5): qty 1

## 2019-07-16 MED ORDER — BISACODYL 5 MG PO TBEC: 5.0000 mg | DELAYED_RELEASE_TABLET | Freq: Every day | ORAL | Status: DC | PRN

## 2019-07-16 MED ORDER — INSULIN ASPART 100 UNIT/ML ~~LOC~~ SOLN
0.0000 [IU] | Freq: Three times a day (TID) | SUBCUTANEOUS | Status: DC
  Administered 2019-07-16: 11 [IU] via SUBCUTANEOUS
  Administered 2019-07-16: 20 [IU] via SUBCUTANEOUS
  Administered 2019-07-16: 08:00:00 7 [IU] via SUBCUTANEOUS
  Administered 2019-07-17: 13:00:00 20 [IU] via SUBCUTANEOUS
  Administered 2019-07-17: 10:00:00 11 [IU] via SUBCUTANEOUS

## 2019-07-16 NOTE — Plan of Care (Signed)
  Problem: Acute Rehab PT Goals(only PT should resolve) Goal: Pt will Roll Supine to Side Flowsheets (Taken 07/16/2019 1639) Pt will Roll Supine to Side: with supervision Goal: Pt Will Go Supine/Side To Sit Flowsheets (Taken 07/16/2019 1639) Pt will go Supine/Side to Sit: with supervision Goal: Pt Will Go Sit To Supine/Side Flowsheets (Taken 07/16/2019 1639) Pt will go Sit to Supine/Side: with supervision Goal: Patient Will Transfer Sit To/From Stand Flowsheets (Taken 07/16/2019 1639) Patient will transfer sit to/from stand: with min guard assist Goal: Pt Will Transfer Bed To Chair/Chair To Bed Flowsheets (Taken 07/16/2019 1639) Pt will Transfer Bed to Chair/Chair to Bed: min guard assist Goal: Pt Will Ambulate Flowsheets (Taken 07/16/2019 1639) Pt will Ambulate:  50 feet  with rolling walker  with min guard assist   Pamala Hurry D. Hartnett-Rands, MS, PT Per [place] [phone] 07/16/2019

## 2019-07-16 NOTE — Evaluation (Signed)
Physical Therapy Evaluation Patient Details Name: Heather Newman MRN: [id number] DOB: 11/28/1940 Today's Date: 07/16/2019   History of Present Illness  Heather Newman is a 79 y.o. 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  She is able to answer simple questions, but is not able to elaborate much about her present illness.  She denies having headache, sore throat, chest pain, abdominal or joint pain.  She states she has chronic back pain, but her back is not particularly hurting at this time.    Clinical Impression  Pt admitted with above diagnosis. Unclear how accurate of a historian patient is. She reports her hip fracture and surgery was 2 - 2 1/2 months ago but chart review shows this happened sometime in 2020. When asked if patient had PT in the nursing home, she stated "no." Later patient recounted a PT as getting her back to moving in the nursing home. Patient does appear to have lived with her granddaughter at one point here in [place]. Patient was able to perform bed mobility, transfers and ambulation with min guard, ambulating 20 feet with RW. She did require multiple attempt to sit-to-stand from regular height chair but completed with min guard and extra time only. It is unclear at this time what equipment is available at granddaughter's house or accessibility (stairs, etc). Patient performed well and could have the potential to DC to her granddaughter's home baring support and accessibility with HHPT built in to the plan. Pt currently with functional  limitations due to the deficits listed below (see PT Problem List). Pt will benefit from skilled PT to increase their independence and safety with mobility to allow discharge to the venue listed below.       Follow Up Recommendations SNF;Home health PT(accessibility of granddaughter's home unknown as of this date.)    Equipment Recommendations  (unclear what equipment is available to granddaughter's home vs nursing home.)    Recommendations for Other Services       Precautions / Restrictions Precautions Precautions: Fall Precaution Comments: patient reports falling and breaking her left hip 2 1/2 months ago Restrictions Weight Bearing Restrictions: No      Mobility  Bed Mobility Overal bed mobility: Needs Assistance Bed Mobility: Rolling;Sidelying to Sit Rolling: Min guard Sidelying to sit: Min guard          Transfers Overall transfer level: Needs assistance Equipment used: Standard walker Transfers: Sit to/from [employer] Transfers Sit to Stand: Min guard Stand pivot transfers: Min guard       General transfer comment: slow, labored movement, multiple attempts to complete sit-to-stand from chair  Ambulation/Gait Ambulation/Gait assistance: Min guard Gait Distance (Feet): 20 Feet Assistive device: Standard walker Gait Pattern/deviations: Step-through pattern;Decreased step length - right;Decreased step length - left;Decreased stride length;Decreased stance time - left;Decreased weight shift to left;Trunk flexed;Antalgic Gait velocity: decreased   General Gait Details: slow, labored gait with RW and 2 LPM oxygen, limited by fatigue  Stairs            Wheelchair Mobility    Modified Rankin (Stroke Patients Only)       Balance Overall balance assessment: Needs assistance Sitting-balance support: Bilateral upper extremity supported;Feet  supported Sitting balance-Leahy Scale: Good     Standing balance support: Bilateral upper extremity  supported;During functional activity Standing balance-Leahy Scale: Fair Standing balance comment: fair with RW                             Pertinent Vitals/Pain Pain Assessment: No/denies pain    Home Living Family/patient expects to be discharged to:: Skilled nursing facility                      Prior Function Level of Independence: Needs assistance   Gait / Transfers Assistance Needed: patient describes lateral scoot transfers from bed to wheelchair           Hand Dominance   Dominant Hand: Right    Extremity/Trunk Assessment   Upper Extremity Assessment Upper Extremity Assessment: Defer to OT evaluation    Lower Extremity Assessment Lower Extremity Assessment: Generalized weakness    Cervical / Trunk Assessment Cervical / Trunk Assessment: Kyphotic  Communication   Communication: No difficulties  Cognition Arousal/Alertness: Awake/alert Behavior During Therapy: WFL for tasks assessed/performed Overall Cognitive Status: Within Functional Limits for tasks assessed                                        General Comments      Exercises     Assessment/Plan    PT Assessment Patient needs continued PT services  PT Problem List Decreased strength;Decreased mobility;Decreased activity tolerance;Decreased balance       PT Treatment Interventions DME instruction;Therapeutic activities;Gait training;Therapeutic exercise;Patient/family education;Balance training    PT Goals (Current goals can be found in the Care Plan section)  Acute Rehab PT Goals Patient Stated Goal: Go home with granddaughter to continue getting strong enough to move into assisted living. PT Goal Formulation: With patient Time For Goal Achievement: 07/30/19 Potential to Achieve Goals: Fair    Frequency Min 3X/week   Barriers to discharge        Co-evaluation               AM-PAC PT "6 Clicks" Mobility  Outcome Measure Help needed turning  from your back to your side while in a flat bed without using bedrails?: A Little Help needed moving from lying on your back to sitting on the side of a flat bed without using bedrails?: A Little Help needed moving to and from a bed to a chair (including a wheelchair)?: A Little Help needed standing up from a chair using your arms (e.g., wheelchair or bedside chair)?: A Little Help needed to walk in hospital room?: A Little Help needed climbing 3-5 steps with a railing? : A Lot 6 Click Score: 17    End of Session Equipment Utilized During Treatment: Gait belt;Oxygen Activity Tolerance: Patient tolerated treatment well;Patient limited by fatigue Patient left: in chair;with call bell/phone within reach Nurse Communication: Mobility status PT Visit Diagnosis: Unsteadiness on feet (R26.81);History of falling (Z91.81);Muscle weakness (generalized) (M62.81);Other abnormalities of gait and mobility (R26.89)    Time: [id number] PT Time Calculation (min) (ACUTE ONLY): 60 min   Charges:   PT Evaluation $PT Eval Moderate Complexity: 1 Mod PT Treatments $Gait Training: 8-22 mins $Therapeutic Activity: 8-22 mins        Floria Raveling. Hartnett-Rands, MS, PT Per [place] [id number] 07/16/2019, 4:32 PM

## 2019-07-16 NOTE — Progress Notes (Signed)
Initial Nutrition Assessment  DOCUMENTATION CODES:      INTERVENTION:  Heart Healthy/CHO modified diet 1.2 liter fluid restriction  ProStat 30 ml TID (each 30 ml provides 100 kcal, 15 gr protein)   NUTRITION DIAGNOSIS:   Increased nutrient needs related to chronic illness(COPD, Hepatic cirrhosis and DM2) as evidenced by estimated needs.  GOAL:  Patient will meet greater than or equal to 90% of their needs    MONITOR:  PO intake, I & O's, Skin, Labs, Weight trends   REASON FOR ASSESSMENT:   Consult Assessment of nutrition requirement/status  ASSESSMENT: Patient is a 79 yo female with hx of GERD, Alzheimer's, CAD, HTN, DM 2, Vitamin D deficiency, HLD, Hepatic cirrhosis. She presents from SNF complaining of shortness of breath.   Patient endorses good appetite. Reports po's >75% meals- feeds herself. She is edentulous but denies chewing difficulty with the exception of peanuts.    Weight gain of 12% since January- an increase of 9 kg (20 lbs).   Medications reviewed and include: pepcid, neurontin, novolog, lantus, lactulose. remeron and Vitmain D (50k units)-weekly.   Labs reviewed: Phos 2.4 (L), Albumin 2.8 (L)   NUTRITION - FOCUSED PHYSICAL EXAM:    Most Recent Value  Orbital Region  No depletion  Upper Arm Region  No depletion  Thoracic and Lumbar Region  No depletion  Buccal Region  No depletion  Temple Region  No depletion  Clavicle Bone Region  Mild depletion  Clavicle and Acromion Bone Region  Mild depletion  Dorsal Hand  No depletion  Patellar Region  No depletion  Anterior Thigh Region  No depletion  Posterior Calf Region  No depletion  Edema (RD Assessment)  Mild  Hair  Reviewed  Eyes  Reviewed  Mouth  Reviewed [endentulous]  Skin  Reviewed  Nails  Reviewed      Diet Order:   Diet Order            Diet heart healthy/carb modified Room service appropriate? Yes; Fluid consistency: Thin; Fluid restriction: 1200 mL Fluid  Diet effective now               EDUCATION NEEDS:   Not appropriate for education at this time Skin:  Skin Assessment: Reviewed RN Assessment  Last BM:  4/18  Height:   Ht Readings from Last 1 Encounters:  07/15/19 5\' 8"  (1.727 m)    Weight:   Wt Readings from Last 1 Encounters:  07/16/19 84.3 kg    Ideal Body Weight:   63.6 kg  BMI:  Body mass index is 28.26 kg/m.  Estimated Nutritional Needs:   Kcal:  1600-1700  Protein:  89-96 gr  Fluid:  1200 ml daily per MD   Colman Cater MS,RD,CSG,LDN Contact through [facility]

## 2019-07-16 NOTE — Progress Notes (Signed)
PROGRESS NOTE    Heather Newman  [id number] DOB: [birth year] DOA: 07/15/2019 PCP: [name], NP   Chief Complaint  Patient presents with  . Shortness of Breath    Brief Narrative:  As per H&P written by Dr. Olevia Bowens on 07/15/2019 79 y.o. 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  She is able to answer simple questions, but is not able to elaborate much about her present illness.  She denies having headache, sore throat, chest pain, abdominal or joint pain.  She states she has chronic back pain, but her back is not particularly hurting at this time.  ED Course: Initial vital signs temperature 99.8 F, pulse 90, respirations 23, blood pressure 132/72 mmHg and O2 sat 100% on nasal cannula oxygen at 4 LPM.  An hour plus after arriving to the ER the patient had 102.1 F fever.  SARS antigen and PCR was negative.  Influenza a and B by PCR was negative.  Her urinalysis was normal.  CBC shows a white count of 8.3 with 81% neutrophils, 6% lymphocytes and 10% monocytes.  Hemoglobin 12.6 g/dL and platelets 135.  PT 14.3, INR 1.1 and PTT 32.  Lactic acid was 1.8 mmol/L.  CMP shows normal electrolytes when calcium is corrected to albumin.  Renal function was normal.  Glucose 147 mg/dL.  Total protein 6.1, albumin two-point a grams per deciliter.  AST was 69 and ALT 67 units/L.  Assessment & Plan: 1-acute on chronic respiratory failure with hypoxia ([place]): In the setting of bronchiectasis and COPD exacerbation. -Continue current antibiotics, steroids, nebulizer management and  to start the use of flutter valve. -Continue oxygen supplementation and titrate down to patient's baseline. -BiPAP has not been required and patient breathing has started to improve. -Will move patient to telemetry.  2-Chronic systolic (congestive) heart failure (HCC) -Stable and compensated -Most recent echocardiogram demonstrated normalization of patient EF -Continue to follow daily weights -Continue low-sodium diet.  3-PAF (paroxysmal atrial fibrillation) (HCC) -Rate control and currently sinus rhythm -Continue to maintain optimize electrolytes. -Continue aspirin, Cardizem and metoprolol.  4-coronary artery disease -No chest pain -No acute ischemic changes appreciated -Continue aspirin, beta-blocker and statins.  5-Alzheimer's disease, unspecified (CODE) ([place]) -No behavioral disturbances appreciated currently -Continue Remeron at bedtime -Continue constant reorientation and supportive care.  6-Other cirrhosis of liver ([place]) -In the setting of hepatic steatosis -Ammonia level is 80 -Continue lactulose -Continue current dose of Lasix starting in a.m.  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   DVT prophylaxis: SCDs Code Status: Full code Family Communication: Significant other updated over the phone. Disposition:   Status is: Inpatient   Dispo: The patient is from: SNF              Anticipated d/c is to: SNF              Anticipated d/c date is: to be determine; hopping for 07/18/19              Patient currently Still not medically stable in the setting of ongoing shortness of breath and requiring treatment  for bronchiectasis and COPD exacerbation.  Will continue IV steroids, nebulizer management, flutter valve and oxygen supplementation.    Consultants:   None   Procedures:  See below for x-ray  reports.   Antimicrobials:  Rocephin and Zithromax (planning for a total of 7 days treatment).   Subjective: Still short of breath on exertion, denies chest pain, no nausea, no vomiting.  Mild difficulty speaking in full sentences.  Currently afebrile  Objective: Vitals:   07/16/19 1300 07/16/19 1400 07/16/19 1500 07/16/19 1626  BP: 102/60 (!) 102/55 (!) 106/58   Pulse: (!) 59 (!) 58 75 (!) 39  Resp:    (!) 23  Temp:    (!) 97.5 F (36.4 C)  TempSrc:    Oral  SpO2: 95% 94% 93% 96%  Weight:      Height:        Intake/Output Summary (Last 24 hours) at 07/16/2019 1846 Last data filed at 07/16/2019 1602 Gross per 24 hour  Intake 349.12 ml  Output 650 ml  Net -300.88 ml   Filed Weights   07/15/19 1830 07/15/19 2128 07/16/19 0500  Weight: 81.6 kg 80.3 kg 84.3 kg    Examination: General exam: Appears calm and comfortable; oriented x2; reports no chest pain, no nausea or vomiting.  Expressed breathing slightly better.  Still with ongoing shortness of breath on activity, having wheezing and mild difficulty speaking in full sentences.  Currently afebrile. Respiratory system: Positive rhonchi bilaterally; expiratory wheezing appreciated on exam.  No using accessory muscles. Cardiovascular system: S1 & S2 heard, no JVD, no rubs, no gallops, no clicks.  No lower extremity edema appreciated.  Positive systolic ejection murmur on exam. Gastrointestinal system: Abdomen is nondistended, soft and nontender. No organomegaly or masses felt. Normal bowel sounds heard. Central nervous system: Alert and oriented. No focal neurological deficits. Extremities: Symmetric 4 out of 5 muscle strength power in the setting of poor effort.  Able to move 4 limbs spontaneously. Skin: No rashes, no petechiae. Psychiatry: Mood & affect appropriate.     Data Reviewed: I have personally reviewed following labs and imaging studies  CBC: Recent Labs  Lab 07/15/19 2119  WBC 8.3  NEUTROABS 6.8  HGB 12.6   HCT 39.0  MCV 97.3  PLT 135*    Basic Metabolic Panel: Recent Labs  Lab 07/15/19 2119  NA 140  K 3.7  CL 101  CO2 28  GLUCOSE 147*  BUN 11  CREATININE 0.42*  CALCIUM 8.7*  MG 1.8  PHOS 2.4*    GFR: Estimated Creatinine Clearance: 67 mL/min (A) (by C-G formula based on SCr of 0.42 mg/dL (L)).  Liver Function Tests: Recent Labs  Lab 07/15/19 2119  AST 69*  ALT 67*  ALKPHOS 108  BILITOT 1.2  PROT 6.1*  ALBUMIN 2.8*    CBG: Recent Labs  Lab 07/16/19 0757 07/16/19 1124 07/16/19 1624  GLUCAP 248* 372* 289*     Recent Results (from the past 240 hour(s))  Respiratory Panel by RT PCR (Flu A&B, Covid) - Nasopharyngeal Swab     Status: None   Collection Time: 07/15/19  7:38 PM   Specimen: Nasopharyngeal Swab  Result Value Ref Range Status   SARS Coronavirus 2 by RT PCR NEGATIVE NEGATIVE Final    Comment: (NOTE) SARS-CoV-2 target nucleic acids are NOT DETECTED. The SARS-CoV-2 RNA is generally detectable in upper respiratoy specimens during the acute phase of infection. The lowest concentration of SARS-CoV-2 viral copies this assay can detect is 131 copies/mL. A negative  result does not preclude SARS-Cov-2 infection and should not be used as the sole basis for treatment or other patient management decisions. A negative result may occur with  improper specimen collection/handling, submission of specimen other than nasopharyngeal swab, presence of viral mutation(s) within the areas targeted by this assay, and inadequate number of viral copies (<131 copies/mL). A negative result must be combined with clinical observations, patient history, and epidemiological information. The expected result is Negative. Fact Sheet for Patients:  PinkCheek.be Fact Sheet for Healthcare Providers:  GravelBags.it This test is not yet ap proved or cleared by the [place] FDA and  has been authorized for detection and/or  diagnosis of SARS-CoV-2 by FDA under an Emergency Use Authorization (EUA). This EUA will remain  in effect (meaning this test can be used) for the duration of the COVID-19 declaration under Section 564(b)(1) of the Act, 21 U.S.C. section 360bbb-3(b)(1), unless the authorization is terminated or revoked sooner.    Influenza A by PCR NEGATIVE NEGATIVE Final   Influenza B by PCR NEGATIVE NEGATIVE Final    Comment: (NOTE) The Xpert Xpress SARS-CoV-2/FLU/RSV assay is intended as an aid in  the diagnosis of influenza from Nasopharyngeal swab specimens and  should not be used as a sole basis for treatment. Nasal washings and  aspirates are unacceptable for Xpert Xpress SARS-CoV-2/FLU/RSV  testing. Fact Sheet for Patients: PinkCheek.be Fact Sheet for Healthcare Providers: GravelBags.it This test is not yet approved or cleared by the [place] FDA and  has been authorized for detection and/or diagnosis of SARS-CoV-2 by  FDA under an Emergency Use Authorization (EUA). This EUA will remain  in effect (meaning this test can be used) for the duration of the  Covid-19 declaration under Section 564(b)(1) of the Act, 21  U.S.C. section 360bbb-3(b)(1), unless the authorization is  terminated or revoked. Performed at [facility], [address] [place], [place] [address]   Blood culture (routine x 2)     Status: None (Preliminary result)   Collection Time: 07/15/19  9:19 PM   Specimen: BLOOD LEFT HAND  Result Value Ref Range Status   Specimen Description BLOOD LEFT HAND  Final   Special Requests   Final    BOTTLES DRAWN AEROBIC AND ANAEROBIC Blood Culture adequate volume   Culture   Final    NO GROWTH < 12 HOURS Performed at [facility], [address], [place], [place] [address]    Report Status PENDING  Incomplete  Blood culture (routine x 2)     Status: None (Preliminary result)   Collection Time: 07/15/19  9:19 PM   Specimen:  BLOOD LEFT FOREARM  Result Value Ref Range Status   Specimen Description BLOOD LEFT FOREARM  Final   Special Requests   Final    BOTTLES DRAWN AEROBIC AND ANAEROBIC Blood Culture adequate volume   Culture   Final    NO GROWTH < 12 HOURS Performed at [facility], [address], [place], [place] [address]    Report Status PENDING  Incomplete  MRSA PCR Screening     Status: None   Collection Time: 07/15/19 11:40 PM   Specimen: Nasal Mucosa; Nasopharyngeal  Result Value Ref Range Status   MRSA by PCR NEGATIVE NEGATIVE Final    Comment:        The GeneXpert MRSA Assay (FDA approved for NASAL specimens only), is one component of a comprehensive MRSA colonization surveillance program. It is not intended to diagnose MRSA infection nor to guide or monitor treatment  for MRSA infections. Performed at [facility], [address], [place], [place] [address]      Radiology Studies: DG Chest Portable 1 View  Result Date: 07/15/2019 CLINICAL DATA:  Shortness of breath EXAM: PORTABLE CHEST 1 VIEW COMPARISON:  05/12/2019 FINDINGS: Mild cardiomegaly. Lingular atelectasis. No effusions or overt edema. No acute bony abnormality. IMPRESSION: Cardiomegaly, lingular atelectasis. Electronically Signed   By: Rolm Baptise M.D.   On: 07/15/2019 19:19    Scheduled Meds: . aspirin EC  81 mg Oral Q breakfast  . budesonide (PULMICORT) nebulizer solution  0.5 mg Nebulization BID  . Chlorhexidine Gluconate Cloth  6 each Topical Daily  . diltiazem  240 mg Oral Daily  . famotidine  20 mg Oral BID  . gabapentin  300 mg Oral BID  . guaiFENesin  600 mg Oral BID  . insulin aspart  0-20 Units Subcutaneous TID WC  . insulin glargine  12 Units Subcutaneous QHS  . ipratropium-albuterol  3 mL Nebulization BID  . lactulose  20 g Oral BID  . methylPREDNISolone (SOLU-MEDROL) injection  40 mg Intravenous Q8H  . metoprolol succinate  50 mg Oral Daily  . mirtazapine  15 mg Oral QHS  . pravastatin  10 mg Oral QHS  .  Vitamin D (Ergocalciferol)  50,000 Units Oral Q Mon   Continuous Infusions: . azithromycin Stopped (07/15/19 2236)  . cefTRIAXone (ROCEPHIN)  IV Stopped (07/15/19 2205)     LOS: 0 days    Time spent: 35 minutes.    Barton Dubois, MD [facility]   To contact the attending provider between 7A-7P or the covering provider during after hours 7P-7A, please log into the web site [email or web address] and access using universal [place] password for that web site. If you do not have the password, please call the hospital operator.  07/16/2019, 6:46 PM

## 2019-07-17 DIAGNOSIS — J441 Chronic obstructive pulmonary disease with (acute) exacerbation: Secondary | ICD-10-CM | POA: Diagnosis not present

## 2019-07-17 DIAGNOSIS — I5022 Chronic systolic (congestive) heart failure: Secondary | ICD-10-CM | POA: Diagnosis not present

## 2019-07-17 DIAGNOSIS — G309 Alzheimer's disease, unspecified: Secondary | ICD-10-CM | POA: Diagnosis not present

## 2019-07-17 DIAGNOSIS — J9621 Acute and chronic respiratory failure with hypoxia: Secondary | ICD-10-CM | POA: Diagnosis not present

## 2019-07-17 LAB — URINE CULTURE: Culture: NO GROWTH

## 2019-07-17 LAB — GLUCOSE, CAPILLARY
Glucose-Capillary: 277 mg/dL — ABNORMAL HIGH (ref 70–99)
Glucose-Capillary: 354 mg/dL — ABNORMAL HIGH (ref 70–99)

## 2019-07-17 MED ORDER — LACTULOSE 10 GM/15ML PO SOLN: 20.0000 g | Freq: Three times a day (TID) | ORAL | Status: DC

## 2019-07-17 MED ORDER — GUAIFENESIN ER 600 MG PO TB12: 600.0000 mg | ORAL_TABLET | Freq: Two times a day (BID) | ORAL | Status: DC

## 2019-07-17 MED ORDER — FAMOTIDINE 20 MG PO TABS: 20.0000 mg | ORAL_TABLET | Freq: Two times a day (BID) | ORAL | Status: DC

## 2019-07-17 MED ORDER — PREDNISONE 20 MG PO TABS: ORAL_TABLET | ORAL | 0 refills | Status: DC

## 2019-07-17 MED ORDER — AMOXICILLIN-POT CLAVULANATE 875-125 MG PO TABS: 1.0000 | ORAL_TABLET | Freq: Two times a day (BID) | ORAL | 0 refills | Status: AC

## 2019-07-17 NOTE — Progress Notes (Signed)
Physical Therapy Treatment Patient Details Name: Heather Newman MRN: [id number] DOB: Jun 02, 1940 Today's Date: 07/17/2019    History of Present Illness Heather Newman is a 79 y.o. 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  She is able to answer simple questions, but is not able to elaborate much about her present illness.  She denies having headache, sore throat, chest pain, abdominal or joint pain.  She states she has chronic back pain, but her back is not particularly hurting at this time.    PT Comments    Patient presents seated in chair (assisted by nursing staff) and agreeable for therapy.  Patient demonstrates slow labored cadence limited mostly due to fatigue, SOB and SpO2 desaturation from 98% to 83% while on 2 LPM O2, tendency to lean over RW for more support once fatigued and continued sitting up in chair after therapy - RN notified.  Patient will benefit from continued physical therapy in hospital and recommended venue below to increase strength, balance, endurance for safe ADLs and gait.    Follow Up Recommendations  SNF     Equipment Recommendations  None recommended by PT    Recommendations for Other Services       Precautions / Restrictions Precautions Precautions: Fall Precaution Comments: patient reports falling and breaking her left hip 2 1/2 months ago Restrictions Weight Bearing Restrictions: No LLE Weight Bearing: Weight bearing as tolerated    Mobility  Bed Mobility               General bed mobility comments: Patient  presents up in chair (assisted by nursing staff)  Transfers Overall transfer level: Needs assistance Equipment used: Rolling walker (2 wheeled) Transfers: Sit to/from [facility] Sit to Stand: Min guard Stand pivot transfers: Min guard       General transfer comment: slow labored movement  Ambulation/Gait Ambulation/Gait assistance: Min assist Gait Distance (Feet): 22 Feet Assistive device: Rolling walker (2 wheeled) Gait Pattern/deviations: Decreased step length - right;Decreased step length - left;Decreased stride length;Trunk flexed Gait velocity: decreased   General Gait Details: slow labored cadence with tendency to lean over RW for more support due to generalized weakness, on 2 LPM with SpO2 dropping from 98% to 83%, limited secondary to fatigue and difficulty breathing   Stairs             Wheelchair Mobility    Modified Rankin (Stroke Patients Only)       Balance Overall balance assessment: Needs assistance Sitting-balance support: Bilateral upper extremity supported;Feet supported Sitting balance-Leahy Scale: Good Sitting balance - Comments: seated in chair   Standing balance support: Bilateral upper extremity supported;During functional activity Standing balance-Leahy Scale: Fair Standing balance comment: fair with RW                            Cognition Arousal/Alertness: Awake/alert Behavior During Therapy: WFL for tasks assessed/performed Overall Cognitive Status: Within Functional Limits for tasks assessed  Exercises General Exercises - Lower Extremity Ankle Circles/Pumps: Seated;AROM;Strengthening;Both;10 reps Long Arc Quad: Seated;AROM;Strengthening;Both;10 reps Hip Flexion/Marching: Seated;AROM;Strengthening;Both;10 reps    General Comments        Pertinent Vitals/Pain Pain Assessment: No/denies pain    Home Living Family/patient expects to be  discharged to:: Private residence Living Arrangements: Other relatives(granddaughter) Available Help at Discharge: Family Type of Home: House Home Access: Level entry   Home Layout: One level Home Equipment: Bedside commode;Transport chair;Walker - 4 wheels;Hospital bed;Adaptive equipment Additional Comments: Patient lives with granddaughter and granddaughter's friend who is assisting as caregiver.    Prior Function Level of Independence: Needs assistance  Gait / Transfers Assistance Needed: Reports use of RW for mobility ADL's / Homemaking Assistance Needed: SNF staff assist with ADLs if needed, pt reports independence in basic ADLs     PT Goals (current goals can now be found in the care plan section) Acute Rehab PT Goals Patient Stated Goal: Go home with granddaughter to continue getting strong enough to move into assisted living. PT Goal Formulation: With patient Time For Goal Achievement: 07/30/19 Potential to Achieve Goals: Fair Progress towards PT goals: Progressing toward goals    Frequency    Min 3X/week      PT Plan Current plan remains appropriate    Co-evaluation              AM-PAC PT "6 Clicks" Mobility   Outcome Measure  Help needed turning from your back to your side while in a flat bed without using bedrails?: A Little Help needed moving from lying on your back to sitting on the side of a flat bed without using bedrails?: A Little Help needed moving to and from a bed to a chair (including a wheelchair)?: A Little Help needed standing up from a chair using your arms (e.g., wheelchair or bedside chair)?: A Little Help needed to walk in hospital room?: A Lot Help needed climbing 3-5 steps with a railing? : A Lot 6 Click Score: 16    End of Session Equipment Utilized During Treatment: Oxygen Activity Tolerance: Patient tolerated treatment well;Patient limited by fatigue Patient left: in chair;with call bell/phone within reach Nurse Communication:  Mobility status PT Visit Diagnosis: Unsteadiness on feet (R26.81);History of falling (Z91.81);Muscle weakness (generalized) (M62.81);Other abnormalities of gait and mobility (R26.89)     Time: [id number] PT Time Calculation (min) (ACUTE ONLY): 25 min  Charges:  $Gait Training: 8-22 mins $Therapeutic Exercise: 8-22 mins                     10:24 AM, 07/17/19 Lonell Grandchild, MPT Physical Therapist with [facility] [phone] office [phone] mobile phone

## 2019-07-17 NOTE — Evaluation (Signed)
Occupational Therapy Evaluation Patient Details Name: Heather Newman MRN: [id number] DOB: [birth year] Today's Date: 07/17/2019    History of Present Illness Heather Newman is a 79 y.o. 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  She is able to answer simple questions, but is not able to elaborate much about her present illness.  She denies having headache, sore throat, chest pain, abdominal or joint pain.  She states she has chronic back pain, but her back is not particularly hurting at this time.   Clinical Impression   Pt up in chair eating breakfast on OT arrival, agreeable to OT evaluation. Pt reports she had been living with her granddaughter, then transitioned to SNF after illness, and wants to return to granddaughters house with intent on ALF in the future. Pt already completed most morning ADLs on OT arrival however performed seated ADLs with mod independence to set-up this am, unsteadiness and fatigue limiting tolerance for standing ADLs. Recommend HHOT on discharge to improve safety and independence in ADL completion in home environment. No further acute OT services required at this time.     Follow Up Recommendations  Home health OT;Supervision/Assistance - 24 hour    Equipment Recommendations  None recommended by OT       Precautions / Restrictions Precautions Precautions: Fall Restrictions Weight Bearing Restrictions: No      Mobility Bed Mobility               General bed mobility comments: Pt up in chair on OT arrival  Transfers Overall  transfer level: Needs assistance Equipment used: 4-wheeled walker Transfers: Sit to/from Stand;Stand Pivot Transfers Sit to Stand: Min guard Stand pivot transfers: Min guard                ADL either performed or assessed with clinical judgement   ADL Overall ADL's : Needs assistance/impaired Eating/Feeding: Independent;Sitting Eating/Feeding Details (indicate cue type and reason): pt sitting in chair finishing breakfast on OT arrival Grooming: Set up;Sitting Grooming Details (indicate cue type and reason): pt performing grooming tasks in sitting, unable to complete in standing due to unsteadiness and fatigue             Lower Body Dressing: Supervision/safety;Sitting/lateral leans   Toilet Transfer: Min guard;Stand-pivot;RW             General ADL Comments: pt requiring assistance with ADLs due to generalized weakness and easily fatigued     Vision Baseline Vision/History: Wears glasses Wears Glasses: At all times Patient Visual Report: No change from baseline Vision Assessment?: No apparent visual deficits            Pertinent Vitals/Pain Pain Assessment: No/denies pain     Hand Dominance Right   Extremity/Trunk Assessment Upper Extremity Assessment Upper Extremity Assessment: Generalized weakness   Lower Extremity Assessment Lower Extremity Assessment: Defer to PT evaluation   Cervical / Trunk Assessment Cervical / Trunk Assessment: Kyphotic   Communication Communication Communication: No difficulties   Cognition Arousal/Alertness: Awake/alert Behavior During Therapy: WFL for tasks assessed/performed Overall Cognitive Status: Within Functional Limits for tasks assessed  Home Living Family/patient expects to be discharged to:: Private residence Living Arrangements: Other relatives(granddaughter) Available Help at Discharge: Family Type of Home: House Home Access: Level  entry     [place]: One level     Bathroom Shower/Tub: Teacher, early years/pre: [place]: Bedside commode;Transport chair;Walker - 4 wheels;Hospital bed;Adaptive equipment Adaptive Equipment: Reacher Additional Comments: Patient lives with granddaughter and granddaughter's friend who is assisting as caregiver.      Prior Functioning/Environment Level of Independence: Needs assistance  Gait / Transfers Assistance Needed: Reports use of RW for mobility ADL's / Homemaking Assistance Needed: SNF staff assist with ADLs if needed, pt reports independence in basic ADLs            OT Problem List: Decreased strength;Decreased activity tolerance;Impaired balance (sitting and/or standing);Decreased knowledge of use of DME or AE       End of Session Equipment Utilized During Treatment: Rolling walker;Oxygen  Activity Tolerance: Patient tolerated treatment well Patient left: in chair;with call bell/phone within reach  OT Visit Diagnosis: Muscle weakness (generalized) (M62.81)                Time: [id number] OT Time Calculation (min): 12 min Charges:  OT General Charges $OT Visit: 1 Visit OT Evaluation $OT Eval Low Complexity: 1 Low   Guadelupe Sabin, OTR/L  [phone] 07/17/2019, 8:46 AM

## 2019-07-17 NOTE — Discharge Summary (Signed)
Physician Discharge Summary  Heather Newman [id number] DOB: 1940/11/21 DOA: 07/15/2019  PCP: Heather Mayo, NP  Admit date: 07/15/2019 Discharge date: 07/17/2019  Time spent: 35 minutes  Recommendations for Outpatient Follow-up:  1. Repeat complete metabolic panel to follow electrolytes, renal function and LFTs 2. Reassess complete resolution of COPD exacerbation. 3. Please arrange follow-up with gastroenterology service ( Dr. Gala Newman or Dr. Laural Newman), in order to establish further follow-up from her cirrhosis standpoint and liver cancer screening.  Discharge Diagnoses:  Principal Problem:   Acute on chronic respiratory failure with hypoxia (HCC) Active Problems:   Chronic systolic (congestive) heart failure (HCC)   PAF (paroxysmal atrial fibrillation) (HCC)   Alzheimer's disease, unspecified (CODE) (HCC)   Other cirrhosis of liver (HCC)   Diabetes mellitus type 2 with complications (HCC)   COPD exacerbation (HCC)   Hypophosphatemia   CAD (coronary artery disease)   Essential hypertension   Fever   Discharge Condition: Stable and improved.  Discharge back to skilled nursing facility for further care and rehabilitation.  Recommend follow-up with PCP in 10 days.  CODE STATUS: Full code.  Diet recommendation: Heart healthy/low-sodium diet along with modified carbohydrates.  Filed Weights   07/15/19 2128 07/16/19 0500 07/17/19 0500  Weight: 80.3 kg 84.3 kg 84 kg    History of present illness:  As per H&P written by Dr. 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 She denies having headache, sore throat, chest pain, abdominal or joint pain. She states she has chronic back pain, but her back is not particularly hurting at this time.  ED Course:Initial vital signs temperature 99.8 F, pulse 90, respirations 23, blood pressure 132/72 mmHg and O2 sat 100% on nasal cannula oxygen at 4 LPM. An hour plus after arriving to the ER the patient had 102.1 F fever.  SARS antigen and PCR was negative. Influenza a and B by PCR was negative. Her urinalysis was normal. CBC shows a white count of 8.3 with 81% neutrophils, 6% lymphocytes and 10% monocytes. Hemoglobin 12.6 g/dL and platelets 135. PT 14.3, INR 1.1 and PTT 32. Lactic acid was 1.8 mmol/L. CMP shows normal electrolytes when calcium is corrected to albumin. Renal function was normal. Glucose 147 mg/dL. Total protein 6.1, albumin two-point a grams per deciliter. AST was 69 and ALT 67 units/L.  Hospital Course:  1-acute on chronic respiratory failure with hypoxia [facility]): In the setting of bronchiectasis and COPD exacerbation. -Continue current antibiotics to complete antibiotic therapy -will use steroids tapering and resume maintenance and rescue inhaler.  -patient will continue oxygen supplementation and will benefit of follow up with Pulmonologist to repeat PFT's and further adjust maintenance therapy of her COPD/enphysema.  2-Chronic systolic (congestive) heart failure (HCC) -Stable and compensated -Most recent echocardiogram demonstrated normalization of patient EF -Continue to follow daily weights -Continue low-sodium diet and current dose of lasix.   3-PAF (paroxysmal atrial fibrillation) (HCC) -Rate controlled and currently sinus rhythm -Continue to monitor and maintain optimized electrolytes. -Continue aspirin, Cardizem and metoprolol.  4-coronary  artery disease -No chest pain -No acute ischemic  changes appreciated on telemetry or EKG. -Continue aspirin, beta-blocker and statins.  5-Alzheimer's disease, unspecified (CODE) ([place]) -No behavioral disturbances appreciated currently -Continue Remeron at bedtime -Continue constant reorientation and supportive care.  6-Other cirrhosis of liver (HCC) -In the setting of hepatic steatosis -Ammonia level is 80 -Continue lactulose, dose adjusted for better control of her ammonia level. -In case the head level remains elevated patient will be a candidate for use of rifaximin. -Follow-up with gastroenterologist (either Dr. Gala Newman or Dr. Laural Newman) to assist with further follow up/cirrhosis management and liver cancer screening.  -Continue current dose of Lasix -advised to follow low sodium diet.   7-Diabetes mellitus type 2 with complications (HCC) -Continue modified carbohydrate diet -Continue Lantus and sliding scale insulin -Maintain adequate hydration.  Procedures:  See below for x-ray reports.   Consultations:  None   Discharge Exam: Vitals:   07/17/19 1400 07/17/19 1500  BP: 112/80 124/65  Pulse:    Resp: (!) 29   Temp:    SpO2:      General: Stable and improved.  Speaking in full sentences are reporting no chest pain, no nausea, no vomiting.  Good oxygen saturation on her chronic supplementation and expressing significant improvement in her breathing.  She feels ready to go back to her facility. Cardiovascular: S1 and S2, no rubs, no gallops, no JVD. Respiratory: Scattered rhonchi right, no frank wheezing currently; improved air movement bilaterally.  Normal respiratory effort. Abdomen: Soft, nontender, distended, positive bowel sounds Extremities: No edema, no cyanosis, no clubbing.  Discharge Instructions   Discharge Instructions    Diet - low sodium heart healthy   Complete by: As directed    Discharge instructions   Complete by: As directed    Take  medications as prescribed Maintain adequate hydration Follow low-sodium diet Note is increased dosage of lactulose to 3 times a day to further maintain adequate levels of ammonia. Repeat complete metabolic panel in 1 week to follow LFTs, electrolytes and renal function.     Allergies as of 07/17/2019   No Known Allergies     Medication List    STOP taking these medications   mometasone-formoterol 100-5 MCG/ACT Aero Commonly known as: DULERA   nicotine 21 mg/24hr patch Commonly known as: NICODERM CQ - dosed in mg/24 hours   rosuvastatin 5 MG tablet Commonly known as: CRESTOR   UNABLE TO FIND   UNABLE TO FIND     TAKE these medications   acetaminophen 325 MG tablet Commonly known as: TYLENOL Take 325-650 mg by mouth every 6 (six) hours as needed for mild pain or moderate pain.   ADMELOG SOLOSTAR [place] Inject 1-6 Units into the skin See admin instructions. Inject as per sliding scale: 201 - 250 = 1 unit; 251 - 300 = 2 units; 300 - 350 = 4 units; 351 - 400 = 6 units   albuterol (2.5 MG/3ML) 0.083% nebulizer solution Commonly known as: PROVENTIL Take 3 mLs (2.5 mg total) by nebulization every 6 (six) hours as needed for wheezing or shortness of breath.   amoxicillin-clavulanate 875-125 MG tablet Commonly known as: Augmentin Take 1 tablet by mouth 2 (two) times daily for 10 days.   aspirin EC 81 MG tablet Take 1 tablet (81 mg total) by mouth daily with breakfast.   bisacodyl 5 MG EC tablet Commonly known as: DULCOLAX Take 5 mg by mouth daily as needed for moderate constipation.   diltiazem 240 MG 24 hr capsule Commonly known as: CARDIZEM CD Take 1 capsule (240 mg  total) by mouth daily. For Heart   famotidine 20 MG tablet Commonly known as: PEPCID Take 1 tablet (20 mg total) by mouth 2 (two) times daily.   fluticasone 50 MCG/ACT nasal spray Commonly known as: FLONASE Place 1 spray into both nostrils daily.   fluticasone furoate-vilanterol 100-25 MCG/INH  Aepb Commonly known as: BREO ELLIPTA Inhale 1 puff into the lungs in the morning and at bedtime.   Fluticasone-Salmeterol 100-50 MCG/DOSE Aepb Commonly known as: ADVAIR Inhale 1 puff into the lungs 2 (two) times daily.   furosemide 20 MG tablet Commonly known as: LASIX Take 1 tablet (20 mg total) by mouth daily.   gabapentin 300 MG capsule Commonly known as: NEURONTIN Take 1 capsule (300 mg total) by mouth 2 (two) times daily. What changed: when to take this   guaiFENesin 600 MG 12 hr tablet Commonly known as: MUCINEX Take 1 tablet (600 mg total) by mouth 2 (two) times daily.   insulin aspart 100 UNIT/ML FlexPen Commonly known as: NOVOLOG Inject 0-6 Units into the skin See admin instructions. 1) follow-up with cardiologist Dr. Domenic Polite in 2 to 3 weeks for recheck 2)insulin aspart (novoLOG) injection 0- 6 Units 0- 6 Units Subcutaneous, 3 times daily with meals CBG < 70: Implement Hypoglycemia Standing Orders and refer to Hypoglycemia Standing Orders sidebar report  CBG 70 - 120: 0 unit CBG 121 - 150: 0 unit  CBG 151 - 200: 0 unit CBG 201 - 250: 1 units CBG 251 - 300: 2 units CBG 301 - 350: 4 units  CBG 351 - 400: 6 units  CBG > 400: 6 units   Basaglar KwikPen 100 UNIT/ML Inject 10 Units into the skin daily.   insulin glargine 100 UNIT/ML injection Commonly known as: LANTUS Inject 0.1 mLs (10 Units total) into the skin at bedtime.   ipratropium-albuterol 0.5-2.5 (3) MG/3ML Soln Commonly known as: DUONEB Take 3 mLs by nebulization 2 (two) times daily.   lactulose 10 GM/15ML solution Commonly known as: CHRONULAC Take 30 mLs (20 g total) by mouth 3 (three) times daily.   magnesium hydroxide 400 MG/5ML suspension Commonly known as: MILK OF MAGNESIA Take 30 mLs by mouth daily as needed for mild constipation.   meclizine 25 MG tablet Commonly known as: ANTIVERT Take 25 mg by mouth 3 (three) times daily as needed for dizziness.   metFORMIN 500 MG tablet Commonly known as:  GLUCOPHAGE Take 1 tablet (500 mg total) by mouth 2 (two) times daily with a meal.   metoprolol succinate 50 MG 24 hr tablet Commonly known as: Toprol XL Take 1 tablet (50 mg total) by mouth daily.   mirtazapine 15 MG tablet Commonly known as: REMERON Take 1 tablet (15 mg total) by mouth at bedtime.   pravastatin 10 MG tablet Commonly known as: PRAVACHOL Take 1 tablet (10 mg total) by mouth daily.   predniSONE 20 MG tablet Commonly known as: Deltasone Take 3 tabs by mouth daily X1 days, then 2 tabs by mouth daily X 2 days; then 1 tab daily X 3 days; then 1/2 tab daily X 3 days and stop prednisone. What changed:   how much to take  how to take this  when to take this  additional instructions   Vitamin D (Ergocalciferol) 1.25 MG (50000 UNIT) Caps capsule Commonly known as: DRISDOL Take 1 capsule (50,000 Units total) by mouth every 7 (seven) days. What changed: when to take this      No Known Allergies Follow-up Information  Heather Mayo, NP. Schedule an appointment as soon as possible for a visit in 10 day(s).   Specialty: Family Medicine Contact information: [address] [place]  [address] [phone]           The results of significant diagnostics from this hospitalization (including imaging, microbiology, ancillary and laboratory) are listed below for reference.    Significant Diagnostic Studies: DG Chest Portable 1 View  Result Date: 07/15/2019 CLINICAL DATA:  Shortness of breath EXAM: PORTABLE CHEST 1 VIEW COMPARISON:  05/12/2019 FINDINGS: Mild cardiomegaly. Lingular atelectasis. No effusions or overt edema. No acute bony abnormality. IMPRESSION: Cardiomegaly, lingular atelectasis. Electronically Signed   By: Rolm Baptise M.D.   On: 07/15/2019 19:19    Microbiology: Recent Results (from the past 240 hour(s))  Respiratory Panel by RT PCR (Flu A&B, Covid) - Nasopharyngeal Swab     Status: None   Collection Time: 07/15/19  7:38 PM   Specimen:  Nasopharyngeal Swab  Result Value Ref Range Status   SARS Coronavirus 2 by RT PCR NEGATIVE NEGATIVE Final    Comment: (NOTE) SARS-CoV-2 target nucleic acids are NOT DETECTED. The SARS-CoV-2 RNA is generally detectable in upper respiratoy specimens during the acute phase of infection. The lowest concentration of SARS-CoV-2 viral copies this assay can detect is 131 copies/mL. A negative result does not preclude SARS-Cov-2 infection and should not be used as the sole basis for treatment or other patient management decisions. A negative result may occur with  improper specimen collection/handling, submission of specimen other than nasopharyngeal swab, presence of viral mutation(s) within the areas targeted by this assay, and inadequate number of viral copies (<131 copies/mL). A negative result must be combined with clinical observations, patient history, and epidemiological information. The expected result is Negative. Fact Sheet for Patients:  PinkCheek.be Fact Sheet for Healthcare Providers:  GravelBags.it This test is not yet ap proved or cleared by the [place] FDA and  has been authorized for detection and/or diagnosis of SARS-CoV-2 by FDA under an Emergency Use Authorization (EUA). This EUA will remain  in effect (meaning this test can be used) for the duration of the COVID-19 declaration under Section 564(b)(1) of the Act, 21 U.S.C. section 360bbb-3(b)(1), unless the authorization is terminated or revoked sooner.    Influenza A by PCR NEGATIVE NEGATIVE Final   Influenza B by PCR NEGATIVE NEGATIVE Final    Comment: (NOTE) The Xpert Xpress SARS-CoV-2/FLU/RSV assay is intended as an aid in  the diagnosis of influenza from Nasopharyngeal swab specimens and  should not be used as a sole basis for treatment. Nasal washings and  aspirates are unacceptable for Xpert Xpress SARS-CoV-2/FLU/RSV  testing. Fact Sheet for  Patients: PinkCheek.be Fact Sheet for Healthcare Providers: GravelBags.it This test is not yet approved or cleared by the [place] FDA and  has been authorized for detection and/or diagnosis of SARS-CoV-2 by  FDA under an Emergency Use Authorization (EUA). This EUA will remain  in effect (meaning this test can be used) for the duration of the  Covid-19 declaration under Section 564(b)(1) of the Act, 21  U.S.C. section 360bbb-3(b)(1), unless the authorization is  terminated or revoked. Performed at [facility], [address] [place],  [address]   Blood culture (routine x 2)     Status: None (Preliminary result)   Collection Time: 07/15/19  9:19 PM   Specimen: BLOOD LEFT HAND  Result Value Ref Range Status   Specimen Description BLOOD LEFT HAND  Final   Special Requests  Final    BOTTLES DRAWN AEROBIC AND ANAEROBIC Blood Culture adequate volume   Culture   Final    NO GROWTH 2 DAYS Performed at [facility], [address] [place], [place] [address]    Report Status PENDING  Incomplete  Blood culture (routine x 2)     Status: None (Preliminary result)   Collection Time: 07/15/19  9:19 PM   Specimen: BLOOD LEFT FOREARM  Result Value Ref Range Status   Specimen Description BLOOD LEFT FOREARM  Final   Special Requests   Final    BOTTLES DRAWN AEROBIC AND ANAEROBIC Blood Culture adequate volume   Culture   Final    NO GROWTH 2 DAYS Performed at [facility], [address] [place], [place] [address]    Report Status PENDING  Incomplete  Urine culture     Status: None   Collection Time: 07/15/19  9:39 PM   Specimen: Urine, Clean Catch  Result Value Ref Range Status   Specimen Description   Final    URINE, CLEAN CATCH Performed at [facility], [address] [place], [place] [address]    Special Requests   Final    NONE Performed at [facility], [address] [place], [place] [address]    Culture    Final    NO GROWTH Performed at [facility], [place] [address] [place], [place] [address]    Report Status 07/17/2019 FINAL  Final  MRSA PCR Screening     Status: None   Collection Time: 07/15/19 11:40 PM   Specimen: Nasal Mucosa; Nasopharyngeal  Result Value Ref Range Status   MRSA by PCR NEGATIVE NEGATIVE Final    Comment:        The GeneXpert MRSA Assay (FDA approved for NASAL specimens only), is one component of a comprehensive MRSA colonization surveillance program. It is not intended to diagnose MRSA infection nor to guide or monitor treatment for MRSA infections. Performed at [facility], [address] [place],  [address]      Labs: Basic Metabolic Panel: Recent Labs  Lab 07/15/19 2119  NA 140  K 3.7  CL 101  CO2 28  GLUCOSE 147*  BUN 11  CREATININE 0.42*  CALCIUM 8.7*  MG 1.8  PHOS 2.4*   Liver Function Tests: Recent Labs  Lab 07/15/19 2119  AST 69*  ALT 67*  ALKPHOS 108  BILITOT 1.2  PROT 6.1*  ALBUMIN 2.8*    Recent Labs  Lab 07/16/19 0505  AMMONIA 80*   CBC: Recent Labs  Lab 07/15/19 2119  WBC 8.3  NEUTROABS 6.8  HGB 12.6  HCT 39.0  MCV 97.3  PLT 135*   BNP (last 3 results) Recent Labs    05/13/19 0156  BNP 696.0*    CBG: Recent Labs  Lab 07/16/19 1124 07/16/19 1624 07/16/19 2116 07/17/19 0757 07/17/19 1137  GLUCAP 372* 289* 245* 277* 354*    Signed:  Barton Dubois MD.  [facility] 07/17/2019, 3:04 PM

## 2019-07-17 NOTE — TOC Transition Note (Signed)
Transition of Care [facility]) - CM/SW Discharge Note   Patient Details  Name: Heather Newman MRN: [id number] Date of Birth: Dec 15, 1940  Transition of Care [facility]) CM/SW Contact:  Sharina Petre, Chauncey Reading, RN Phone Number: 07/17/2019, 3:28 PM   Clinical Narrative:   Patient from [facility]. Discharging back today. Debbie of [facility] aware. DC clinicals sent. Bedside RN to call report and EMS.     Final next level of care: Skilled Nursing Facility Barriers to Discharge: Barriers Resolved     Discharge Placement              Patient chooses bed at: Other - please specify in the comment section below:([facility]) Patient to be transferred to facility by: EMS           Readmission Risk Interventions No flowsheet data found.

## 2019-07-17 NOTE — Progress Notes (Signed)
Patient discharged back to [facility]. Report called to [facility]. Patient discharged via EMS.

## 2019-07-17 NOTE — Progress Notes (Signed)
Inpatient Diabetes Program Recommendations  AACE/ADA: New Consensus Statement on Inpatient Glycemic Control   Target Ranges:  Prepandial:   less than 140 mg/dL      Peak postprandial:   less than 180 mg/dL (1-2 hours)      Critically ill patients:  140 - 180 mg/dL   Results for INASIA, BRAULT (MRN [id number]) as of 07/17/2019 08:07  Ref. Range 07/16/2019 07:57 07/16/2019 11:24 07/16/2019 16:24 07/16/2019 21:16 07/17/2019 07:57  Glucose-Capillary Latest Ref Range: 70 - 99 mg/dL 248 (H) 372 (H) 289 (H) 245 (H) 277 (H)   Review of Glycemic Control  Diabetes history: DM2 Outpatient Diabetes medications: Basaglar 10 units daily, Ademelog 1-6 units TID with meals, Metformin 500 mg BID Current orders for Inpatient glycemic control: Lantus 12 units QHS, Novolog 0-20 units TID with meals; Solumedrol 40 mg Q8H  Inpatient Diabetes Program Recommendations:    Insulin-If steroids are continued as ordered please consider increasing Lantus to 15 units QHS, adding Novolog 0-5 units QHS for bedtime correction, and adding Novolog 4 units TID with meals if patient eats at least 50% of meals.  Thanks, Barnie Alderman, RN, MSN, CDE Diabetes Coordinator Inpatient Diabetes Program [phone] (Team Pager from 8am to 5pm)

## 2019-07-20 LAB — CULTURE, BLOOD (ROUTINE X 2)
Culture: NO GROWTH
Culture: NO GROWTH
Special Requests: ADEQUATE
Special Requests: ADEQUATE

## 2019-08-01 ENCOUNTER — Encounter (HOSPITAL_COMMUNITY): Payer: Self-pay | Admitting: Physical Therapy

## 2019-08-01 NOTE — Therapy (Signed)
[name], [place], [address] Phone: [phone]   Fax:  [phone]  Patient Details  Name: Heather Newman MRN: [id number] Date of Birth: 1941-03-02 Referring Provider:  No ref. provider found  Encounter Date: 08/01/2019  PHYSICAL THERAPY DISCHARGE SUMMARY  Visits from Start of Care: 4  Current functional level related to goals / functional outcomes: Unable to reassess as patient did not return for f/u visits   Remaining deficits: Unable to reassess as patient did not return for f/u visits    Education / Equipment: Patient had called to cancel last 2 visits per increased swelling and discomfort in LE. Patient did not return for follow up visits and is being DC per non compliance with therapy POC.  Plan:                                                    Patient goals were not met. Patient is being discharged due to not returning since the last visit.  ?????         3:18 PM, 08/01/19 Josue Hector PT DPT  Physical Therapist with [facility]  [phone] [place] [address] [place], [place], [address] Phone: [phone]   Fax:  [phone]

## 2019-08-04 ENCOUNTER — Other Ambulatory Visit: Payer: Self-pay

## 2019-08-04 ENCOUNTER — Encounter (HOSPITAL_COMMUNITY): Payer: Self-pay

## 2019-08-04 ENCOUNTER — Inpatient Hospital Stay (HOSPITAL_COMMUNITY)
Admission: EM | Admit: 2019-08-04 | Discharge: 2019-08-06 | DRG: 378 | Disposition: A | Discharge status: Skilled Nursing Facility | Payer: Medicare Other | Attending: Family Medicine | Admitting: Family Medicine

## 2019-08-04 ENCOUNTER — Emergency Department (HOSPITAL_COMMUNITY): Payer: Medicare Other

## 2019-08-04 DIAGNOSIS — I482 Chronic atrial fibrillation, unspecified: Secondary | ICD-10-CM | POA: Diagnosis present

## 2019-08-04 DIAGNOSIS — K7460 Unspecified cirrhosis of liver: Secondary | ICD-10-CM | POA: Diagnosis present

## 2019-08-04 DIAGNOSIS — K625 Hemorrhage of anus and rectum: Secondary | ICD-10-CM | POA: Diagnosis present

## 2019-08-04 DIAGNOSIS — E1165 Type 2 diabetes mellitus with hyperglycemia: Secondary | ICD-10-CM

## 2019-08-04 DIAGNOSIS — R6510 Systemic inflammatory response syndrome (SIRS) of non-infectious origin without acute organ dysfunction: Secondary | ICD-10-CM | POA: Diagnosis present

## 2019-08-04 DIAGNOSIS — Z7982 Long term (current) use of aspirin: Secondary | ICD-10-CM

## 2019-08-04 DIAGNOSIS — G309 Alzheimer's disease, unspecified: Secondary | ICD-10-CM | POA: Diagnosis present

## 2019-08-04 DIAGNOSIS — J9611 Chronic respiratory failure with hypoxia: Secondary | ICD-10-CM | POA: Diagnosis present

## 2019-08-04 DIAGNOSIS — I110 Hypertensive heart disease with heart failure: Secondary | ICD-10-CM | POA: Diagnosis present

## 2019-08-04 DIAGNOSIS — Z794 Long term (current) use of insulin: Secondary | ICD-10-CM | POA: Diagnosis not present

## 2019-08-04 DIAGNOSIS — A419 Sepsis, unspecified organism: Secondary | ICD-10-CM | POA: Diagnosis present

## 2019-08-04 DIAGNOSIS — K219 Gastro-esophageal reflux disease without esophagitis: Secondary | ICD-10-CM | POA: Diagnosis present

## 2019-08-04 DIAGNOSIS — J449 Chronic obstructive pulmonary disease, unspecified: Secondary | ICD-10-CM | POA: Diagnosis present

## 2019-08-04 DIAGNOSIS — K7581 Nonalcoholic steatohepatitis (NASH): Secondary | ICD-10-CM | POA: Diagnosis present

## 2019-08-04 DIAGNOSIS — I2510 Atherosclerotic heart disease of native coronary artery without angina pectoris: Secondary | ICD-10-CM | POA: Diagnosis present

## 2019-08-04 DIAGNOSIS — I4891 Unspecified atrial fibrillation: Secondary | ICD-10-CM

## 2019-08-04 DIAGNOSIS — R509 Fever, unspecified: Secondary | ICD-10-CM

## 2019-08-04 DIAGNOSIS — F0280 Dementia in other diseases classified elsewhere without behavioral disturbance: Secondary | ICD-10-CM | POA: Diagnosis present

## 2019-08-04 DIAGNOSIS — Z79899 Other long term (current) drug therapy: Secondary | ICD-10-CM

## 2019-08-04 DIAGNOSIS — Z20822 Contact with and (suspected) exposure to covid-19: Secondary | ICD-10-CM | POA: Diagnosis present

## 2019-08-04 DIAGNOSIS — K922 Gastrointestinal hemorrhage, unspecified: Secondary | ICD-10-CM | POA: Diagnosis not present

## 2019-08-04 DIAGNOSIS — I480 Paroxysmal atrial fibrillation: Secondary | ICD-10-CM | POA: Diagnosis present

## 2019-08-04 DIAGNOSIS — I5022 Chronic systolic (congestive) heart failure: Secondary | ICD-10-CM | POA: Diagnosis present

## 2019-08-04 DIAGNOSIS — Z833 Family history of diabetes mellitus: Secondary | ICD-10-CM

## 2019-08-04 DIAGNOSIS — F17210 Nicotine dependence, cigarettes, uncomplicated: Secondary | ICD-10-CM | POA: Diagnosis present

## 2019-08-04 HISTORY — DX: Heart failure, unspecified: I50.9

## 2019-08-04 LAB — COMPREHENSIVE METABOLIC PANEL
ALT: 30 U/L (ref 0–44)
AST: 24 U/L (ref 15–41)
Albumin: 3.4 g/dL — ABNORMAL LOW (ref 3.5–5.0)
Alkaline Phosphatase: 138 U/L — ABNORMAL HIGH (ref 38–126)
Anion gap: 10 (ref 5–15)
BUN: 12 mg/dL (ref 8–23)
CO2: 28 mmol/L (ref 22–32)
Calcium: 9.1 mg/dL (ref 8.9–10.3)
Chloride: 101 mmol/L (ref 98–111)
Creatinine, Ser: 0.62 mg/dL (ref 0.44–1.00)
GFR calc Af Amer: >60 mL/min (ref >60)
GFR calc non Af Amer: >60 mL/min (ref >60)
Glucose, Bld: 224 mg/dL — ABNORMAL HIGH (ref 70–99)
Potassium: 3.7 mmol/L (ref 3.5–5.1)
Sodium: 139 mmol/L (ref 135–145)
Total Bilirubin: 1.8 mg/dL — ABNORMAL HIGH (ref 0.3–1.2)
Total Protein: 7.1 g/dL (ref 6.5–8.1)

## 2019-08-04 LAB — HEMOGLOBIN A1C
Hgb A1c MFr Bld: 8 % — ABNORMAL HIGH (ref 4.8–5.6)
Mean Plasma Glucose: 182.9 mg/dL

## 2019-08-04 LAB — CBC
HCT: 37.1 % (ref 36.0–46.0)
HCT: 37.7 % (ref 36.0–46.0)
Hemoglobin: 11.7 g/dL — ABNORMAL LOW (ref 12.0–15.0)
Hemoglobin: 12.1 g/dL (ref 12.0–15.0)
MCH: 31.4 pg (ref 26.0–34.0)
MCH: 31.8 pg (ref 26.0–34.0)
MCHC: 31.5 g/dL (ref 30.0–36.0)
MCHC: 32.1 g/dL (ref 30.0–36.0)
MCV: 99.5 fL (ref 80.0–100.0)
MCV: 99.2 fL (ref 80.0–100.0)
Platelets: 165 10*3/uL (ref 150–400)
Platelets: 129 10*3/uL — ABNORMAL LOW (ref 150–400)
RBC: 3.73 MIL/uL — ABNORMAL LOW (ref 3.87–5.11)
RBC: 3.8 MIL/uL — ABNORMAL LOW (ref 3.87–5.11)
RDW: 16 % — ABNORMAL HIGH (ref 11.5–15.5)
RDW: 16 % — ABNORMAL HIGH (ref 11.5–15.5)
WBC: 22.3 10*3/uL — ABNORMAL HIGH (ref 4.0–10.5)
WBC: 15.6 10*3/uL — ABNORMAL HIGH (ref 4.0–10.5)
nRBC: 0 % (ref 0.0–0.2)
nRBC: 0 % (ref 0.0–0.2)

## 2019-08-04 LAB — LACTIC ACID, PLASMA
Lactic Acid, Venous: 1.8 mmol/L (ref 0.5–1.9)
Lactic Acid, Venous: 1.5 mmol/L (ref 0.5–1.9)

## 2019-08-04 LAB — AMMONIA: Ammonia: 30 umol/L (ref 9–35)

## 2019-08-04 LAB — CBC WITH DIFFERENTIAL/PLATELET
Abs Immature Granulocytes: 0.14 10*3/uL — ABNORMAL HIGH (ref 0.00–0.07)
Basophils Absolute: 0.1 10*3/uL (ref 0.0–0.1)
Basophils Relative: 0 %
Eosinophils Absolute: 0.2 10*3/uL (ref 0.0–0.5)
Eosinophils Relative: 1 %
HCT: 45.1 % (ref 36.0–46.0)
Hemoglobin: 14.5 g/dL (ref 12.0–15.0)
Immature Granulocytes: 1 %
Lymphocytes Relative: 4 %
Lymphs Abs: 0.7 10*3/uL (ref 0.7–4.0)
MCH: 31.8 pg (ref 26.0–34.0)
MCHC: 32.2 g/dL (ref 30.0–36.0)
MCV: 98.9 fL (ref 80.0–100.0)
Monocytes Absolute: 1.5 10*3/uL — ABNORMAL HIGH (ref 0.1–1.0)
Monocytes Relative: 7 %
Neutro Abs: 17.9 10*3/uL — ABNORMAL HIGH (ref 1.7–7.7)
Neutrophils Relative %: 87 %
Platelets: 205 10*3/uL (ref 150–400)
RBC: 4.56 MIL/uL (ref 3.87–5.11)
RDW: 16.1 % — ABNORMAL HIGH (ref 11.5–15.5)
WBC: 20.4 10*3/uL — ABNORMAL HIGH (ref 4.0–10.5)
nRBC: 0 % (ref 0.0–0.2)

## 2019-08-04 LAB — CBG MONITORING, ED
Glucose-Capillary: 136 mg/dL — ABNORMAL HIGH (ref 70–99)
Glucose-Capillary: 133 mg/dL — ABNORMAL HIGH (ref 70–99)

## 2019-08-04 LAB — URINALYSIS, ROUTINE W REFLEX MICROSCOPIC
Bilirubin Urine: NEGATIVE
Glucose, UA: 50 mg/dL — AB
Hgb urine dipstick: NEGATIVE
Ketones, ur: NEGATIVE mg/dL
Leukocytes,Ua: NEGATIVE
Nitrite: NEGATIVE
Protein, ur: NEGATIVE mg/dL
Specific Gravity, Urine: 1.017 (ref 1.005–1.030)
pH: 6 (ref 5.0–8.0)

## 2019-08-04 LAB — PROTIME-INR
INR: 1.1 (ref 0.8–1.2)
Prothrombin Time: 13.7 seconds (ref 11.4–15.2)

## 2019-08-04 LAB — GLUCOSE, CAPILLARY
Glucose-Capillary: 134 mg/dL — ABNORMAL HIGH (ref 70–99)
Glucose-Capillary: 140 mg/dL — ABNORMAL HIGH (ref 70–99)
Glucose-Capillary: 263 mg/dL — ABNORMAL HIGH (ref 70–99)

## 2019-08-04 LAB — RESPIRATORY PANEL BY RT PCR (FLU A&B, COVID)
Influenza A by PCR: NEGATIVE
Influenza B by PCR: NEGATIVE
SARS Coronavirus 2 by RT PCR: NEGATIVE

## 2019-08-04 LAB — SAMPLE TO BLOOD BANK

## 2019-08-04 LAB — APTT: aPTT: 27 seconds (ref 24–36)

## 2019-08-04 LAB — TROPONIN I (HIGH SENSITIVITY)
Troponin I (High Sensitivity): 14 ng/L (ref <18)
Troponin I (High Sensitivity): 14 ng/L (ref <18)

## 2019-08-04 LAB — MRSA PCR SCREENING: MRSA by PCR: NEGATIVE

## 2019-08-04 MED ORDER — SODIUM CHLORIDE 0.9 % IV SOLN
2.0000 g | Freq: Once | INTRAVENOUS | Status: AC
  Administered 2019-08-04: 2 g via INTRAVENOUS
  Filled 2019-08-04: qty 2

## 2019-08-04 MED ORDER — METRONIDAZOLE IN NACL 5-0.79 MG/ML-% IV SOLN
500.0000 mg | Freq: Once | INTRAVENOUS | Status: AC
  Administered 2019-08-04: 500 mg via INTRAVENOUS
  Filled 2019-08-04: qty 100

## 2019-08-04 MED ORDER — CHLORHEXIDINE GLUCONATE CLOTH 2 % EX PADS
6.0000 | MEDICATED_PAD | Freq: Every day | CUTANEOUS | Status: DC
  Administered 2019-08-04 – 2019-08-06 (×3): 6 via TOPICAL

## 2019-08-04 MED ORDER — VANCOMYCIN HCL IN DEXTROSE 1-5 GM/200ML-% IV SOLN
1000.0000 mg | Freq: Two times a day (BID) | INTRAVENOUS | Status: AC
  Administered 2019-08-04 – 2019-08-05 (×3): 1000 mg via INTRAVENOUS
  Filled 2019-08-04 (×3): qty 200

## 2019-08-04 MED ORDER — INSULIN ASPART 100 UNIT/ML ~~LOC~~ SOLN
0.0000 [IU] | Freq: Every day | SUBCUTANEOUS | Status: DC
  Administered 2019-08-04: 3 [IU] via SUBCUTANEOUS

## 2019-08-04 MED ORDER — FAMOTIDINE 20 MG PO TABS
20.0000 mg | ORAL_TABLET | Freq: Two times a day (BID) | ORAL | Status: DC
  Administered 2019-08-04: 20 mg via ORAL
  Filled 2019-08-04: qty 1

## 2019-08-04 MED ORDER — IPRATROPIUM-ALBUTEROL 0.5-2.5 (3) MG/3ML IN SOLN
3.0000 mL | Freq: Two times a day (BID) | RESPIRATORY_TRACT | Status: DC
  Administered 2019-08-04: 3 mL via RESPIRATORY_TRACT
  Filled 2019-08-04: qty 3

## 2019-08-04 MED ORDER — LACTATED RINGERS IV BOLUS (SEPSIS)
1000.0000 mL | Freq: Once | INTRAVENOUS | Status: AC
  Administered 2019-08-04: 1000 mL via INTRAVENOUS

## 2019-08-04 MED ORDER — INSULIN ASPART 100 UNIT/ML ~~LOC~~ SOLN
0.0000 [IU] | Freq: Three times a day (TID) | SUBCUTANEOUS | Status: DC
  Administered 2019-08-04 – 2019-08-05 (×5): 2 [IU] via SUBCUTANEOUS
  Administered 2019-08-06: 5 [IU] via SUBCUTANEOUS
  Administered 2019-08-06: 2 [IU] via SUBCUTANEOUS
  Filled 2019-08-04: qty 1

## 2019-08-04 MED ORDER — DILTIAZEM LOAD VIA INFUSION
10.0000 mg | Freq: Once | INTRAVENOUS | Status: AC
  Administered 2019-08-04: 10 mg via INTRAVENOUS
  Filled 2019-08-04: qty 10

## 2019-08-04 MED ORDER — PANTOPRAZOLE SODIUM 40 MG IV SOLR
40.0000 mg | Freq: Two times a day (BID) | INTRAVENOUS | Status: DC
  Administered 2019-08-04 – 2019-08-06 (×5): 40 mg via INTRAVENOUS
  Filled 2019-08-04 (×5): qty 40

## 2019-08-04 MED ORDER — ONDANSETRON HCL 4 MG/2ML IJ SOLN: 4.0000 mg | Freq: Four times a day (QID) | INTRAMUSCULAR | Status: DC | PRN

## 2019-08-04 MED ORDER — MOMETASONE FURO-FORMOTEROL FUM 100-5 MCG/ACT IN AERO
2.0000 | INHALATION_SPRAY | Freq: Two times a day (BID) | RESPIRATORY_TRACT | Status: DC
  Administered 2019-08-04 – 2019-08-06 (×5): 2 via RESPIRATORY_TRACT
  Filled 2019-08-04: qty 8.8

## 2019-08-04 MED ORDER — ACETAMINOPHEN 650 MG RE SUPP
650.0000 mg | Freq: Four times a day (QID) | RECTAL | Status: DC | PRN
  Administered 2019-08-04: 650 mg via RECTAL
  Filled 2019-08-04: qty 1

## 2019-08-04 MED ORDER — ACETAMINOPHEN 650 MG RE SUPP
650.0000 mg | Freq: Once | RECTAL | Status: AC
  Administered 2019-08-04: 650 mg via RECTAL
  Filled 2019-08-04: qty 1

## 2019-08-04 MED ORDER — VANCOMYCIN HCL IN DEXTROSE 1-5 GM/200ML-% IV SOLN
1000.0000 mg | Freq: Once | INTRAVENOUS | Status: AC
  Administered 2019-08-04: 1000 mg via INTRAVENOUS
  Filled 2019-08-04: qty 200

## 2019-08-04 MED ORDER — ORAL CARE MOUTH RINSE
15.0000 mL | Freq: Two times a day (BID) | OROMUCOSAL | Status: DC
  Administered 2019-08-04 – 2019-08-06 (×5): 15 mL via OROMUCOSAL

## 2019-08-04 MED ORDER — ACETAMINOPHEN 325 MG PO TABS
650.0000 mg | ORAL_TABLET | Freq: Four times a day (QID) | ORAL | Status: DC | PRN
  Administered 2019-08-05: 650 mg via ORAL
  Filled 2019-08-04: qty 2

## 2019-08-04 MED ORDER — ALBUTEROL SULFATE (2.5 MG/3ML) 0.083% IN NEBU
2.5000 mg | INHALATION_SOLUTION | Freq: Four times a day (QID) | RESPIRATORY_TRACT | Status: DC | PRN
  Administered 2019-08-04: 2.5 mg via RESPIRATORY_TRACT
  Filled 2019-08-04: qty 3

## 2019-08-04 MED ORDER — DILTIAZEM HCL-DEXTROSE 125-5 MG/125ML-% IV SOLN (PREMIX)
5.0000 mg/h | INTRAVENOUS | Status: DC
  Administered 2019-08-04: 7.5 mg/h via INTRAVENOUS
  Administered 2019-08-04: 5 mg/h via INTRAVENOUS
  Administered 2019-08-05: 15 mg/h via INTRAVENOUS
  Filled 2019-08-04 (×4): qty 125

## 2019-08-04 MED ORDER — FLUTICASONE PROPIONATE 50 MCG/ACT NA SUSP
1.0000 | Freq: Every day | NASAL | Status: DC
  Administered 2019-08-04 – 2019-08-06 (×3): 1 via NASAL
  Filled 2019-08-04 (×2): qty 16

## 2019-08-04 MED ORDER — IPRATROPIUM-ALBUTEROL 0.5-2.5 (3) MG/3ML IN SOLN
3.0000 mL | Freq: Two times a day (BID) | RESPIRATORY_TRACT | Status: DC
  Administered 2019-08-04 – 2019-08-06 (×4): 3 mL via RESPIRATORY_TRACT
  Filled 2019-08-04 (×4): qty 3

## 2019-08-04 MED ORDER — MIRTAZAPINE 15 MG PO TABS
15.0000 mg | ORAL_TABLET | Freq: Every day | ORAL | Status: DC
  Administered 2019-08-04 – 2019-08-05 (×2): 15 mg via ORAL
  Filled 2019-08-04 (×2): qty 1

## 2019-08-04 MED ORDER — GABAPENTIN 300 MG PO CAPS
300.0000 mg | ORAL_CAPSULE | Freq: Two times a day (BID) | ORAL | Status: DC
  Administered 2019-08-04 – 2019-08-06 (×5): 300 mg via ORAL
  Filled 2019-08-04 (×5): qty 1

## 2019-08-04 MED ORDER — MECLIZINE HCL 12.5 MG PO TABS: 25.0000 mg | ORAL_TABLET | Freq: Three times a day (TID) | ORAL | Status: DC | PRN

## 2019-08-04 MED ORDER — SODIUM CHLORIDE 0.9 % IV SOLN
2.0000 g | Freq: Three times a day (TID) | INTRAVENOUS | Status: DC
  Administered 2019-08-04 – 2019-08-06 (×7): 2 g via INTRAVENOUS
  Filled 2019-08-04 (×7): qty 2

## 2019-08-04 MED ORDER — ONDANSETRON HCL 4 MG PO TABS: 4.0000 mg | ORAL_TABLET | Freq: Four times a day (QID) | ORAL | Status: DC | PRN

## 2019-08-04 NOTE — ED Notes (Signed)
Admission screenings obtained via [facility] paperwork sent to ED due to patient's dementia

## 2019-08-04 NOTE — Progress Notes (Addendum)
Patient seen and evaluated, chart reviewed, please see EMR for updated orders. Please see full H&P dictated by admitting physician Dr. Humphrey Rolls for same date of service.   Brief Summary 79 year old with advanced dementia, COPD, HTN, DM, chronic hypoxic respiratory failure on 2 L of oxygen chronically, liver cirrhosis, chronic atrial fibrillation not on anticoagulation, admitted on 03/31/2447 from [facility] with concerns about rectal bleeding and possible sepsis   A/p 1)SIRS with concerns for sepsis----on admission patient met  SIRS criteria with leukocytosis, tachycardia and tachypnea--- no evidence of infection at this time, currently on cefepime on Vanco pending cultures -Lactic acid is not elevated  2)Rectal bleeding--- hemoglobin on admission was 14.5 down to 11.7 with hydration, however patient's baseline hemoglobin is usually around 12--probably hemoconcentrated initially on admission -Follow H&H and get GI consult if indicated  3)DM2-A1c 8.0, reflecting uncontrolled DM PTA,  -Patient was on Lantus 10 units twice daily and Metformin Use Novolog/Humalog Sliding scale insulin with Accu-Cheks/Fingersticks as ordered   4) A. fib with RVR--- continue IV Cardizem drip for rate control, patient was not on anticoagulation PTA -Recent echo with EF of 55% -Currently with concerns about rectal bleeding, anticoagulation contraindicated at this time  5)Dementia--with significant cognitive deficits, patient is a poor historian -Continue Remeron  6)CAD--stable, hold aspirin  7)COPD--stable, continue Dulera, as needed neb treatments  8)H/o Liver Cirrhosis--- hold lactulose due to GI bleed  Patient seen and evaluated, chart reviewed, please see EMR for updated orders. Please see full H&P dictated by admitting physician Dr. Humphrey Rolls for same date of service.

## 2019-08-04 NOTE — H&P (Signed)
History and Physical    Heather Newman [id number] DOB: 08-03-1940 DOA: 08/04/2019  PCP: [name], NP (Confirm with patient/family/NH records and if not entered, this has to be entered at [facility] point of entry) Patient coming from: [facility]  I have personally briefly reviewed patient's old medical records in [place]  Chief Complaint: Rectal bleeding  HPI: Heather Newman is a 79 y.o. female with medical history significant of Alzheimer disease, diabetes mellitus, hypertension, COPD chronically on 2 L of oxygen with nasal cannula and atrial fibrillation not on any anticoagulation presented to ED for evaluation of rectal bleed.  Patient has a history of severe dementia and a poor historian therefore most of the history was gathered from ED physician.  As per ED physician, it was reported to her that the nursing facility staff found blood in her diaper this morning therefore she is sent for further evaluation.  Patient denies fever, chills, nausea, vomiting, abdominal pain, diarrhea and constipation.  Patient also denies sore throat, cough, chest pain, shortness of breath, urinary symptoms and body pains.  ED Course: On arrival to the ED patient had fever of 101.6, blood pressure 151/92, heart rate 120, respiratory rate 22 and oxygen saturation 97% on 2 L of oxygen.  Blood work showed WBC 20.4, hemoglobin 14.5, INR 1.1, sodium 139, potassium 3.7, BUN 12, creatinine 0.6 and blood glucose 224.  Troponin negative, Covid negative, UA negative.  Chest x-ray negative for acute cardiopulmonary problem.  Hemoccult came back positive. EKG showed atrial fibrillation with RVR.  Patient was given IV fluids according to the sepsis protocol in the ED and was given broad-spectrum antibiotics with IV vancomycin, IV cefepime and IV metronidazole.  Patient was also started on diltiazem drip after Cardizem bolus for atrial fibrillation with RVR.  Review of Systems: As per HPI otherwise 10  point review of systems negative.  Unacceptable ROS statements: "10 systems reviewed," "Extensive" (without elaboration).  Acceptable ROS statements: "All others negative," "All others reviewed and are negative," and "All others unremarkable," with at [place] documented Can't double dip - if using for HPI can't use for ROS  Past Medical History:  Diagnosis Date  . Alzheimer's disease ([place])   . Atherosclerosis of native arteries of right leg with ulceration of other part of foot ([place])   . Bilateral primary osteoarthritis of knee   . CAD (coronary artery disease)   . Calculus of ureter   . CHF (congestive heart failure) ([place])   . COPD (chronic obstructive pulmonary disease) ([place])   . Depression   . Displaced spiral fracture of shaft of left femur, subsequent encounter for closed fracture with routine healing   . Essential hypertension   . GERD (gastroesophageal reflux disease)   . Hepatic cirrhosis ([place])   . Hyperlipidemia   . LBBB (left bundle branch block)   . Melanocytic nevi, unspecified   . OSA (obstructive sleep apnea)   . PAF (paroxysmal atrial fibrillation) ([place])   . Type 2 diabetes mellitus ([place])   . Vitamin D deficiency     Past Surgical History:  Procedure Laterality Date  . CHOLECYSTECTOMY    . SPINE SURGERY    . STRABISMUS SURGERY       reports that she has been smoking cigarettes. She has never used smokeless tobacco. She reports that she does not drink alcohol or use drugs.  No Known Allergies  Family History  Problem Relation Age of Onset  . Diabetes Mellitus II Mother   .  Pneumonia Father     Unacceptable: Noncontributory, unremarkable, or negative. Acceptable: (example)Family history negative for heart disease  Prior to Admission medications   Medication Sig Start Date End Date Taking? Authorizing Provider  acetaminophen (TYLENOL) 325 MG tablet Take 325-650 mg by mouth every 6 (six) hours as needed for mild pain or moderate pain.    [provider]  albuterol (PROVENTIL) (2.5 MG/3ML) 0.083% nebulizer solution Take 3 mLs (2.5 mg total) by nebulization every 6 (six) hours as needed for wheezing or shortness of breath. 03/28/19   [name], NP  aspirin EC 81 MG tablet Take 1 tablet (81 mg total) by mouth daily with breakfast. 05/16/19   Roxan Hockey, MD  bisacodyl (DULCOLAX) 5 MG EC tablet Take 5 mg by mouth daily as needed for moderate constipation.    [provider]  diltiazem (CARDIZEM CD) 240 MG 24 hr capsule Take 1 capsule (240 mg total) by mouth daily. For Heart 05/17/19   Roxan Hockey, MD  famotidine (PEPCID) 20 MG tablet Take 1 tablet (20 mg total) by mouth 2 (two) times daily. 07/17/19   Barton Dubois, MD  fluticasone (FLONASE) 50 MCG/ACT nasal spray Place 1 spray into both nostrils daily.    [provider]  fluticasone furoate-vilanterol (BREO ELLIPTA) 100-25 MCG/INH AEPB Inhale 1 puff into the lungs in the morning and at bedtime. 05/15/19   [name], NP  Fluticasone-Salmeterol (ADVAIR) 100-50 MCG/DOSE AEPB Inhale 1 puff into the lungs 2 (two) times daily.    [provider]  furosemide (LASIX) 20 MG tablet Take 1 tablet (20 mg total) by mouth daily. 05/16/19   Roxan Hockey, MD  gabapentin (NEURONTIN) 300 MG capsule Take 1 capsule (300 mg total) by mouth 2 (two) times daily. Patient taking differently: Take 300 mg by mouth 3 (three) times daily.  05/16/19   Roxan Hockey, MD  guaiFENesin (MUCINEX) 600 MG 12 hr tablet Take 1 tablet (600 mg total) by mouth 2 (two) times daily. 07/17/19   Barton Dubois, MD  insulin aspart (NOVOLOG) 100 UNIT/ML FlexPen Inject 0-6 Units into the skin See admin instructions. 1) follow-up with cardiologist Dr. Domenic Polite in 2 to 3 weeks for recheck 2)insulin aspart (novoLOG) injection 0- 6 Units 0- 6 Units Subcutaneous, 3 times daily with meals CBG < 70: Implement Hypoglycemia Standing Orders and refer to Hypoglycemia Standing Orders sidebar report   CBG 70 - 120: 0 unit CBG 121 - 150: 0 unit  CBG 151 - 200: 0 unit CBG 201 - 250: 1 units CBG 251 - 300: 2 units CBG 301 - 350: 4 units  CBG 351 - 400: 6 units  CBG > 400: 6 units Patient not taking: Reported on 07/16/2019 05/16/19   Roxan Hockey, MD  Insulin Glargine (BASAGLAR KWIKPEN) 100 UNIT/ML Inject 10 Units into the skin daily.    [provider]  insulin glargine (LANTUS) 100 UNIT/ML injection Inject 0.1 mLs (10 Units total) into the skin at bedtime. Patient not taking: Reported on 07/16/2019 05/16/19   Roxan Hockey, MD  Insulin Lispro (ADMELOG SOLOSTAR [place]) Inject 1-6 Units into the skin See admin instructions. Inject as per sliding scale: 201 - 250 = 1 unit; 251 - 300 = 2 units; 300 - 350 = 4 units; 351 - 400 = 6 units    [provider]  ipratropium-albuterol (DUONEB) 0.5-2.5 (3) MG/3ML SOLN Take 3 mLs by nebulization 2 (two) times daily. 03/29/19   Fayrene Helper, MD  lactulose ([place]) 10  GM/15ML solution Take 30 mLs (20 g total) by mouth 3 (three) times daily. 07/17/19   Barton Dubois, MD  magnesium hydroxide (MILK OF MAGNESIA) 400 MG/5ML suspension Take 30 mLs by mouth daily as needed for mild constipation.    [provider]  meclizine (ANTIVERT) 25 MG tablet Take 25 mg by mouth 3 (three) times daily as needed for dizziness.    [provider]  metFORMIN (GLUCOPHAGE) 500 MG tablet Take 1 tablet (500 mg total) by mouth 2 (two) times daily with a meal. 05/16/19   Emokpae, Courage, MD  metoprolol succinate (TOPROL XL) 50 MG 24 hr tablet Take 1 tablet (50 mg total) by mouth daily. 05/16/19 05/15/20  Roxan Hockey, MD  mirtazapine (REMERON) 15 MG tablet Take 1 tablet (15 mg total) by mouth at bedtime. 05/01/19   [name], NP  pravastatin (PRAVACHOL) 10 MG tablet Take 1 tablet (10 mg total) by mouth daily. Patient not taking: Reported on 07/16/2019 04/09/19   Fayrene Helper, MD  predniSONE (DELTASONE) 20 MG tablet Take 3 tabs by mouth  daily X1 days, then 2 tabs by mouth daily X 2 days; then 1 tab daily X 3 days; then 1/2 tab daily X 3 days and stop prednisone. 07/17/19   Barton Dubois, MD  Vitamin D, Ergocalciferol, (DRISDOL) 1.25 MG (50000 UNIT) CAPS capsule Take 1 capsule (50,000 Units total) by mouth every 7 (seven) days. Patient taking differently: Take 50,000 Units by mouth every Monday.  04/09/19   Fayrene Helper, MD    Physical Exam: Vitals:   08/04/19 0430 08/04/19 0500 08/04/19 0530 08/04/19 0545  BP: 127/60 103/68 135/79 (!) 125/54  Pulse: 99 95 94 98  Resp: (!) 24 (!) 24 (!) 21 (!) 23  Temp:      TempSrc:      SpO2: 99% 99% 100% 100%  Weight:      Height:        Constitutional: NAD, calm, comfortable Vitals:   08/04/19 0430 08/04/19 0500 08/04/19 0530 08/04/19 0545  BP: 127/60 103/68 135/79 (!) 125/54  Pulse: 99 95 94 98  Resp: (!) 24 (!) 24 (!) 21 (!) 23  Temp:      TempSrc:      SpO2: 99% 99% 100% 100%  Weight:      Height:       General: Patient is a 79 year old female not in any acute distress. Eyes: PERRL, lids and conjunctivae normal ENMT: Mucous membranes are moist. Posterior pharynx clear of any exudate or lesions.Normal dentition.  Neck: normal, supple, no masses, no thyromegaly Respiratory: clear to auscultation bilaterally, no wheezing, no crackles. Normal respiratory effort. No accessory muscle use.  Cardiovascular: Regular rate and rhythm, no murmurs / rubs / gallops. No extremity edema. 2+ pedal pulses. No carotid bruits.  Abdomen: no tenderness, no masses palpated. No hepatosplenomegaly. Bowel sounds positive.  Musculoskeletal: no clubbing / cyanosis. No joint deformity upper and lower extremities. Good ROM, no contractures. Normal muscle tone.  Skin: no rashes, lesions, ulcers. No induration Neurologic: CN 2-12 grossly intact. Sensation intact, DTR normal. Strength 5/5 in all 4.  Psychiatric: Patient is awake and alert but not oriented to time person and place.  Patient has a  history of Alzheimer dementia.  (Anything < 9 systems with 2 bullets each down codes to level 1) (If patient refuses exam can't bill higher level) (Make sure to document decubitus ulcers present on admission -- if possible -- and whether patient has chronic indwelling catheter  at time of admission)  Labs on Admission: I have personally reviewed following labs and imaging studies  CBC: Recent Labs  Lab 08/04/19 0311  WBC 20.4*  NEUTROABS 17.9*  HGB 14.5  HCT 45.1  MCV 98.9  PLT 99991111   Basic Metabolic Panel: Recent Labs  Lab 08/04/19 0311  NA 139  K 3.7  CL 101  CO2 28  GLUCOSE 224*  BUN 12  CREATININE 0.62  CALCIUM 9.1   GFR: Estimated Creatinine Clearance: 66.8 mL/min (by C-G formula based on SCr of 0.62 mg/dL). Liver Function Tests: Recent Labs  Lab 08/04/19 0311  AST 24  ALT 30  ALKPHOS 138*  BILITOT 1.8*  PROT 7.1  ALBUMIN 3.4*   No results for input(s): LIPASE, AMYLASE in the last 168 hours. Recent Labs  Lab 08/04/19 0311  AMMONIA 30   Coagulation Profile: Recent Labs  Lab 08/04/19 0311  INR 1.1   Cardiac Enzymes: No results for input(s): CKTOTAL, CKMB, CKMBINDEX, TROPONINI in the last 168 hours. BNP (last 3 results) No results for input(s): PROBNP in the last 8760 hours. HbA1C: No results for input(s): HGBA1C in the last 72 hours. CBG: No results for input(s): GLUCAP in the last 168 hours. Lipid Profile: No results for input(s): CHOL, HDL, LDLCALC, TRIG, CHOLHDL, LDLDIRECT in the last 72 hours. Thyroid Function Tests: No results for input(s): TSH, T4TOTAL, FREET4, T3FREE, THYROIDAB in the last 72 hours. Anemia Panel: No results for input(s): VITAMINB12, FOLATE, FERRITIN, TIBC, IRON, RETICCTPCT in the last 72 hours. Urine analysis:    Component Value Date/Time   COLORURINE YELLOW 08/04/2019 0339   APPEARANCEUR CLEAR 08/04/2019 0339   LABSPEC 1.017 08/04/2019 0339   PHURINE 6.0 08/04/2019 0339   GLUCOSEU 50 (A) 08/04/2019 0339   HGBUR  NEGATIVE 08/04/2019 0339   BILIRUBINUR NEGATIVE 08/04/2019 0339   KETONESUR NEGATIVE 08/04/2019 0339   PROTEINUR NEGATIVE 08/04/2019 0339   NITRITE NEGATIVE 08/04/2019 0339   LEUKOCYTESUR NEGATIVE 08/04/2019 0339    Radiological Exams on Admission: DG Chest Port 1 View  Result Date: 08/04/2019 CLINICAL DATA:  Fever, rectal bleeding EXAM: PORTABLE CHEST 1 VIEW COMPARISON:  07/15/2019 FINDINGS: Single frontal view of the chest demonstrates an enlarged cardiac silhouette, stable. Chronic central vascular congestion without airspace disease, effusion, or pneumothorax. No acute bony abnormalities. IMPRESSION: 1. Stable chest, no acute process. Electronically Signed   By: Randa Ngo M.D.   On: 08/04/2019 03:35      Assessment/Plan Principal Problem:   Rectal bleeding Hemoccult positive. IV Protonix 40 mg twice daily ordered. Hemoglobin is stable and improved from last visit to 14.5. Continue to monitor CBC.  Will consider GI consult if hemoglobin drops on next CBC. Although patient has a history of atrial fibrillation but she is not on any anticoagulation.  INR is 1.1.  Active Problems:  Atrial fibrillation with RVR Continue IV diltiazem at this time.  Patient will be started on home antiarrhythmic medications after discontinuation of diltiazem drip. No anticoagulation ordered because of acute rectal bleed.  SIRS (systemic inflammatory response syndrome) ([place]) Patient is meeting the SIRS criteria with fever, tachycardia or tachypnea and leukocytosis but no obvious source of infection present at this time. Continue IV vancomycin and IV Cefepime. Blood and urine cultures ordered.     Hyperglycemia due to diabetes mellitus (HCC) Moderate dose sliding scale insulin ordered Blood glucose monitoring and hypoglycemic protocol in place.  Hypertension Hold home blood pressure medications at this time because of soft blood pressures secondary to sepsis. Restart  home medications once the  condition becomes stable.  Dementia: Patient is at her baseline.  GERD Continue IV Protonix 40 mg twice daily     (please populate well all problems here in Problem List. (For example, if patient is on BP meds at home and you resume or decide to hold them, it is a problem that needs to be her. Same for CAD, COPD, HLD and so on)     DVT prophylaxis: No chemical prophylaxis because of active rectal bleed.  SCDs ordered Code Status: Full code Consults called: Consider GI consult if hemoglobin continues to drop.  Hemoglobin at this time is 14.5. Admission status: Observation/telemetry   Edmonia Lynch MD [facility] Pager [phone]   If 7PM-7AM, please contact night-coverage [email or web address] Password   08/04/2019, 6:45 AM

## 2019-08-04 NOTE — Progress Notes (Deleted)
Patient found to be satting 85-86% on room air. Placed on 2L oxygen via nasal cannula. Continued to remain 86-87% so increased to 3L, with no relief. Currently at 5L via nasal cannula. Glucose resulted 11. Immediately administered 50% Dextrose IV. To continue monitoring and recheck glucose in 15 minutes.

## 2019-08-04 NOTE — Progress Notes (Signed)
Pharmacy Antibiotic Note  Heather Newman is a 79 y.o. female admitted on 08/04/2019 with sepsis.  Pharmacy has been consulted for Vancomycin/Cefepime dosing. WBC is elevated. ?GIB. Renal function ok.   Plan: Vancomycin 1000 mg IV q12h >>Estimated AUC: 486 Cefepime 2g IV q8h Trend WBC, temp, renal function  F/U infectious work-up Drug levels as indicated   Height: 5\' 8"  (172.7 cm) Weight: 84 kg (185 lb 3 oz) IBW/kg (Calculated) : 63.9  Temp (24hrs), Avg:101.1 F (38.4 C), Min:100.6 F (38.1 C), Max:101.6 F (38.7 C)  Recent Labs  Lab 08/04/19 0311  WBC 20.4*  CREATININE 0.62  LATICACIDVEN 1.8    Estimated Creatinine Clearance: 66.8 mL/min (by C-G formula based on SCr of 0.62 mg/dL).    No Known Allergies  Narda Bonds, PharmD, BCPS Clinical Pharmacist Phone: [phone]

## 2019-08-04 NOTE — ED Notes (Signed)
EKG done and seen by Dr Eliane Decree. Patient on 12 lead at this time

## 2019-08-04 NOTE — ED Provider Notes (Signed)
[facility] EMERGENCY DEPARTMENT Provider Note   CSN: [id number] Arrival date & time: 08/04/19  0239   Time seen 2:46 AM  History Chief Complaint  Patient presents with  . GI Bleeding   Level 5 caveat for Alzheimer's disease  Shayleen Gruse is a 79 y.o. female.  HPI Patient has a history of Alzheimer's disease, COPD chronically on 2 L/min nasal cannula oxygen with recent admissions in February and April for COPD exacerbations.  In those admissions I document she was smoking 2 packs a day.  Patient is living in a nursing facility and they found her this morning with rectal bleeding and states her diaper was bloodsoaked and it was running up her back.  Patient denies any abdominal pain but states she has had nausea she also states she has been vomiting but that was not relayed to EMS.  She denies any coughing to me.  Patient also has a history of cirrhosis and her last admission she was supposed to be following up with gastroenterology.  PCP [name], NP     Past Medical History:  Diagnosis Date  . Alzheimer's disease ([place])   . 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 Bilateral primary osteoarthritis of knee   . CAD (coronary artery disease)   . Calculus of ureter   . COPD (chronic obstructive pulmonary disease) ([place])   . Depression   . Displaced spiral fracture of shaft of left femur, subsequent encounter for closed fracture with routine healing   . Essential hypertension   . GERD (gastroesophageal reflux disease)   . Hepatic cirrhosis ([place])   . Hyperlipidemia   . LBBB (left bundle branch block)   . Melanocytic nevi, unspecified   . OSA (obstructive sleep apnea)   . PAF (paroxysmal atrial fibrillation) ([place])   . Type 2 diabetes mellitus ([place])   . Vitamin D deficiency     Patient Active Problem List   Diagnosis Date Noted  . Fever 07/16/2019  . Acute on chronic respiratory failure with hypoxia ([place])  07/15/2019  . Hypophosphatemia   . CAD (coronary artery disease)   . Essential hypertension   . Atrial flutter with rapid ventricular response ([place])   . History of cardiomyopathy   . Pressure injury of skin 05/13/2019  . Acute respiratory failure with hypoxia and hypercapnia ([place]) 05/12/2019  . COPD exacerbation ([place]) 05/12/2019  . Pressure injury of skin of left buttock 04/03/2019  . Pressure injury of heel, stage 2 ([place]) 04/03/2019  . Diabetes mellitus type 2 with complications ([place]) 99991111  . Nail overgrowth 03/29/2019  . Pressure injury of left heel, stage 2 ([place]) 03/29/2019  . Obstructive sleep apnea (adult) (pediatric)   . Altered mental status, unspecified   . Atherosclerosis of native arteries of right leg with ulceration of other part of foot ([place])   . Atrial premature depolarization   . Calculus of ureter   . Chronic systolic (congestive) heart failure ([place])   . Cognitive communication deficit   . Dementia in other diseases classified elsewhere without behavioral disturbance ([place])   . Emphysema, unspecified ([place])   . Immobility syndrome (paraplegic)   . Left bundle-branch block, unspecified   . Major depressive disorder, single episode, unspecified   . Acute metabolic encephalopathy   . Other disorders of peripheral nervous system   . Other hypersomnia   . Other symptoms and signs concerning food and fluid intake   . Pain, unspecified   .  Sleep disorder   . Stereotyped movement disorders   . PAF (paroxysmal atrial fibrillation) ([place])   . Alzheimer's disease, unspecified (CODE) ([place])   . Coronary artery disease of native artery of native heart with stable angina pectoris ([place])   . Bilateral primary osteoarthritis of knee   . Circadian rhythm sleep disorder, advanced sleep phase type   . Constipation, unspecified   . Difficulty in walking, not elsewhere classified   . Insomnia due to medical condition   . Erythematous condition, unspecified   . Localized edema     . Melanocytic nevi, unspecified   . Other cirrhosis of liver ([place])   . Other fatigue   . Snoring   . Other symptoms and signs involving the musculoskeletal system   . Vitamin D deficiency, unspecified     Past Surgical History:  Procedure Laterality Date  . CHOLECYSTECTOMY    . SPINE SURGERY    . STRABISMUS SURGERY       OB History   No obstetric history on file.     Family History  Problem Relation Age of Onset  . Diabetes Mellitus II Mother   . Pneumonia Father     Social History   Tobacco Use  . Smoking status: Current Every Day Smoker    Types: Cigarettes  . Smokeless tobacco: Never Used  Substance Use Topics  . Alcohol use: Never  . Drug use: Never  Lives in a nursing facility Home oxygen  Home Medications Prior to Admission medications   Medication Sig Start Date End Date Taking? Authorizing Provider  acetaminophen (TYLENOL) 325 MG tablet Take 325-650 mg by mouth every 6 (six) hours as needed for mild pain or moderate pain.    [provider]  albuterol (PROVENTIL) (2.5 MG/3ML) 0.083% nebulizer solution Take 3 mLs (2.5 mg total) by nebulization every 6 (six) hours as needed for wheezing or shortness of breath. 03/28/19   [name], NP  aspirin EC 81 MG tablet Take 1 tablet (81 mg total) by mouth daily with breakfast. 05/16/19   Roxan Hockey, MD  bisacodyl (DULCOLAX) 5 MG EC tablet Take 5 mg by mouth daily as needed for moderate constipation.    [provider]  diltiazem (CARDIZEM CD) 240 MG 24 hr capsule Take 1 capsule (240 mg total) by mouth daily. For Heart 05/17/19   Roxan Hockey, MD  famotidine (PEPCID) 20 MG tablet Take 1 tablet (20 mg total) by mouth 2 (two) times daily. 07/17/19   Barton Dubois, MD  fluticasone (FLONASE) 50 MCG/ACT nasal spray Place 1 spray into both nostrils daily.    [provider]  fluticasone furoate-vilanterol (BREO ELLIPTA) 100-25 MCG/INH AEPB Inhale 1 puff into the lungs in the morning and  at bedtime. 05/15/19   [name], NP  Fluticasone-Salmeterol (ADVAIR) 100-50 MCG/DOSE AEPB Inhale 1 puff into the lungs 2 (two) times daily.    [provider]  furosemide (LASIX) 20 MG tablet Take 1 tablet (20 mg total) by mouth daily. 05/16/19   Roxan Hockey, MD  gabapentin (NEURONTIN) 300 MG capsule Take 1 capsule (300 mg total) by mouth 2 (two) times daily. Patient taking differently: Take 300 mg by mouth 3 (three) times daily.  05/16/19   Roxan Hockey, MD  guaiFENesin (MUCINEX) 600 MG 12 hr tablet Take 1 tablet (600 mg total) by mouth 2 (two) times daily. 07/17/19   Barton Dubois, MD  insulin aspart (NOVOLOG) 100 UNIT/ML FlexPen Inject 0-6 Units into the skin See admin instructions.  1) follow-up with cardiologist Dr. Domenic Polite in 2 to 3 weeks for recheck 2)insulin aspart (novoLOG) injection 0- 6 Units 0- 6 Units Subcutaneous, 3 times daily with meals CBG < 70: Implement Hypoglycemia Standing Orders and refer to Hypoglycemia Standing Orders sidebar report  CBG 70 - 120: 0 unit CBG 121 - 150: 0 unit  CBG 151 - 200: 0 unit CBG 201 - 250: 1 units CBG 251 - 300: 2 units CBG 301 - 350: 4 units  CBG 351 - 400: 6 units  CBG > 400: 6 units Patient not taking: Reported on 07/16/2019 05/16/19   Roxan Hockey, MD  Insulin Glargine (BASAGLAR KWIKPEN) 100 UNIT/ML Inject 10 Units into the skin daily.    [provider]  insulin glargine (LANTUS) 100 UNIT/ML injection Inject 0.1 mLs (10 Units total) into the skin at bedtime. Patient not taking: Reported on 07/16/2019 05/16/19   Roxan Hockey, MD  Insulin Lispro (ADMELOG SOLOSTAR [place]) Inject 1-6 Units into the skin See admin instructions. Inject as per sliding scale: 201 - 250 = 1 unit; 251 - 300 = 2 units; 300 - 350 = 4 units; 351 - 400 = 6 units    [provider]  ipratropium-albuterol (DUONEB) 0.5-2.5 (3) MG/3ML SOLN Take 3 mLs by nebulization 2 (two) times daily. 03/29/19   Fayrene Helper, MD  lactulose (CHRONULAC) 10  GM/15ML solution Take 30 mLs (20 g total) by mouth 3 (three) times daily. 07/17/19   Barton Dubois, MD  magnesium hydroxide (MILK OF MAGNESIA) 400 MG/5ML suspension Take 30 mLs by mouth daily as needed for mild constipation.    [provider]  meclizine (ANTIVERT) 25 MG tablet Take 25 mg by mouth 3 (three) times daily as needed for dizziness.    [provider]  metFORMIN (GLUCOPHAGE) 500 MG tablet Take 1 tablet (500 mg total) by mouth 2 (two) times daily with a meal. 05/16/19   Emokpae, Courage, MD  metoprolol succinate (TOPROL XL) 50 MG 24 hr tablet Take 1 tablet (50 mg total) by mouth daily. 05/16/19 05/15/20  Roxan Hockey, MD  mirtazapine (REMERON) 15 MG tablet Take 1 tablet (15 mg total) by mouth at bedtime. 05/01/19   [name], NP  pravastatin (PRAVACHOL) 10 MG tablet Take 1 tablet (10 mg total) by mouth daily. Patient not taking: Reported on 07/16/2019 04/09/19   Fayrene Helper, MD  predniSONE (DELTASONE) 20 MG tablet Take 3 tabs by mouth daily X1 days, then 2 tabs by mouth daily X 2 days; then 1 tab daily X 3 days; then 1/2 tab daily X 3 days and stop prednisone. 07/17/19   Barton Dubois, MD  Vitamin D, Ergocalciferol, (DRISDOL) 1.25 MG (50000 UNIT) CAPS capsule Take 1 capsule (50,000 Units total) by mouth every 7 (seven) days. Patient taking differently: Take 50,000 Units by mouth every Monday.  04/09/19   Fayrene Helper, MD    Allergies    Patient has no known allergies.  Review of Systems   Review of Systems  All other systems reviewed and are negative.   Physical Exam Updated Vital Signs BP 127/60   Pulse 99   Temp (!) 100.6 F (38.1 C) (Rectal)   Resp (!) 24   Ht 5\' 8"  (1.727 m)   Wt 84 kg   SpO2 99%   BMI 28.16 kg/m   Physical Exam Vitals and nursing note reviewed.  Constitutional:      General: She is in acute distress.     Appearance:  Normal appearance. She is obese.  HENT:     Head: Normocephalic and atraumatic.  Eyes:      Extraocular Movements: Extraocular movements intact.     Conjunctiva/sclera: Conjunctivae normal.     Pupils: Pupils are equal, round, and reactive to light.  Cardiovascular:     Rate and Rhythm: Tachycardia present. Rhythm irregular.     Pulses: Normal pulses.  Pulmonary:     Effort: Respiratory distress present.     Breath sounds: No wheezing or rales.  Abdominal:     General: There is distension.     Palpations: Abdomen is soft.     Tenderness: There is no abdominal tenderness.  Genitourinary:    Comments: When we remove her diaper in the ED there is no blood seen in her diaper or around her rectal area however when I do the rectal exam there is red-brown material on the glove. Musculoskeletal:        General: Normal range of motion.     Cervical back: Normal range of motion.     Right lower leg: Edema present.     Left lower leg: Edema present.     Comments: Patient has mild pitting edema both lower extremities  Skin:    General: Skin is warm and dry.     Comments: Patient feels warm to touch  Neurological:     General: No focal deficit present.     Mental Status: She is alert.     Cranial Nerves: No cranial nerve deficit.  Psychiatric:        Mood and Affect: Mood is anxious.        Speech: Speech is delayed.        Behavior: Behavior is slowed.     ED Results / Procedures / Treatments   Labs (all labs ordered are listed, but only abnormal results are displayed) Results for orders placed or performed during the hospital encounter of 08/04/19  Blood Culture (routine x 2)   Specimen: BLOOD LEFT FOREARM  Result Value Ref Range   Specimen Description BLOOD LEFT FOREARM    Special Requests      BOTTLES DRAWN AEROBIC AND ANAEROBIC Blood Culture adequate volume Performed at [facility], [address] [place], [place] [address]    Culture PENDING    Report Status PENDING   Blood Culture (routine x 2)   Specimen: Left Antecubital; Blood  Result Value Ref Range    Specimen Description LEFT ANTECUBITAL    Special Requests      BOTTLES DRAWN AEROBIC AND ANAEROBIC Blood Culture adequate volume Performed at [facility], [address], [place], [place] [address]    Culture PENDING    Report Status PENDING   Respiratory Panel by RT PCR (Flu A&B, Covid) - Nasopharyngeal Swab   Specimen: Nasopharyngeal Swab  Result Value Ref Range   SARS Coronavirus 2 by RT PCR NEGATIVE NEGATIVE   Influenza A by PCR NEGATIVE NEGATIVE   Influenza B by PCR NEGATIVE NEGATIVE  Lactic acid, plasma  Result Value Ref Range   Lactic Acid, Venous 1.8 0.5 - 1.9 mmol/L  Comprehensive metabolic panel  Result Value Ref Range   Sodium 139 135 - 145 mmol/L   Potassium 3.7 3.5 - 5.1 mmol/L   Chloride 101 98 - 111 mmol/L   CO2 28 22 - 32 mmol/L   Glucose, Bld 224 (H) 70 - 99 mg/dL   BUN 12 8 - 23 mg/dL   Creatinine, Ser 0.62 0.44 - 1.00  mg/dL   Calcium 9.1 8.9 - 10.3 mg/dL   Total Protein 7.1 6.5 - 8.1 g/dL   Albumin 3.4 (L) 3.5 - 5.0 g/dL   AST 24 15 - 41 U/L   ALT 30 0 - 44 U/L   Alkaline Phosphatase 138 (H) 38 - 126 U/L   Total Bilirubin 1.8 (H) 0.3 - 1.2 mg/dL   GFR calc non Af Amer >60 >60 mL/min   GFR calc Af Amer >60 >60 mL/min   Anion gap 10 5 - 15  CBC WITH DIFFERENTIAL  Result Value Ref Range   WBC 20.4 (H) 4.0 - 10.5 K/uL   RBC 4.56 3.87 - 5.11 MIL/uL   Hemoglobin 14.5 12.0 - 15.0 g/dL   HCT 45.1 36.0 - 46.0 %   MCV 98.9 80.0 - 100.0 fL   MCH 31.8 26.0 - 34.0 pg   MCHC 32.2 30.0 - 36.0 g/dL   RDW 16.1 (H) 11.5 - 15.5 %   Platelets 205 150 - 400 K/uL   nRBC 0.0 0.0 - 0.2 %   Neutrophils Relative % 87 %   Neutro Abs 17.9 (H) 1.7 - 7.7 K/uL   Lymphocytes Relative 4 %   Lymphs Abs 0.7 0.7 - 4.0 K/uL   Monocytes Relative 7 %   Monocytes Absolute 1.5 (H) 0.1 - 1.0 K/uL   Eosinophils Relative 1 %   Eosinophils Absolute 0.2 0.0 - 0.5 K/uL   Basophils Relative 0 %   Basophils Absolute 0.1 0.0 - 0.1 K/uL   Immature Granulocytes 1 %   Abs Immature  Granulocytes 0.14 (H) 0.00 - 0.07 K/uL  APTT  Result Value Ref Range   aPTT 27 24 - 36 seconds  Protime-INR  Result Value Ref Range   Prothrombin Time 13.7 11.4 - 15.2 seconds   INR 1.1 0.8 - 1.2  Urinalysis, Routine w reflex microscopic  Result Value Ref Range   Color, Urine YELLOW YELLOW   APPearance CLEAR CLEAR   Specific Gravity, Urine 1.017 1.005 - 1.030   pH 6.0 5.0 - 8.0   Glucose, UA 50 (A) NEGATIVE mg/dL   Hgb urine dipstick NEGATIVE NEGATIVE   Bilirubin Urine NEGATIVE NEGATIVE   Ketones, ur NEGATIVE NEGATIVE mg/dL   Protein, ur NEGATIVE NEGATIVE mg/dL   Nitrite NEGATIVE NEGATIVE   Leukocytes,Ua NEGATIVE NEGATIVE  Ammonia  Result Value Ref Range   Ammonia 30 9 - 35 umol/L  Sample to Blood Bank  Result Value Ref Range   Blood Bank Specimen SAMPLE AVAILABLE FOR TESTING    Sample Expiration      08/05/2019,2359 Performed at [facility], [address], [place], [place] [address]   Troponin I (High Sensitivity)  Result Value Ref Range   Troponin I (High Sensitivity) 14 <18 ng/L   Laboratory interpretation all normal except leukocytosis, positive Hemoccult, nonfasting hyperglycemia, minor elevation of some of her LFTs    EKG EKG Interpretation  Date/Time:  Saturday Aug 04 2019 02:54:29 EDT Ventricular Rate:  118 PR Interval:    QRS Duration: 115 QT Interval:  296 QTC Calculation: 415 R Axis:   109 Text Interpretation: Atrial fibrillation Ventricular premature complex Nonspecific intraventricular conduction delay Probable anterolateral infarct, age indeterm Since last tracing rate faster ST now depressed in V4-6 Confirmed by Rolland Porter [phone]) on 08/04/2019 2:58:46 AM   Radiology DG Chest Port 1 View  Result Date: 08/04/2019 CLINICAL DATA:  Fever, rectal bleeding EXAM: PORTABLE CHEST 1 VIEW COMPARISON:  07/15/2019 FINDINGS: Single frontal view of the chest  demonstrates an enlarged cardiac silhouette, stable. Chronic central vascular congestion without airspace  disease, effusion, or pneumothorax. No acute bony abnormalities. IMPRESSION: 1. Stable chest, no acute process. Electronically Signed   By: Randa Ngo M.D.   On: 08/04/2019 03:35    Procedures .Critical Care Performed by: Rolland Porter, MD Authorized by: Rolland Porter, MD   Critical care provider statement:    Critical care time (minutes):  39   Critical care was necessary to treat or prevent imminent or life-threatening deterioration of the following conditions:  Circulatory failure, respiratory failure, sepsis and CNS failure or compromise   Critical care was time spent personally by me on the following activities:  Discussions with consultants, examination of patient, obtaining history from patient or surrogate, review of old charts, re-evaluation of patient's condition, pulse oximetry, ordering and review of radiographic studies and ordering and review of laboratory studies   (including critical care time)  Medications Ordered in ED Medications  metroNIDAZOLE (FLAGYL) IVPB 500 mg (500 mg Intravenous New Bag/Given 08/04/19 0357)  vancomycin (VANCOCIN) IVPB 1000 mg/200 mL premix (1,000 mg Intravenous New Bag/Given 08/04/19 0354)  diltiazem (CARDIZEM) 1 mg/mL load via infusion 10 mg (10 mg Intravenous Bolus from Bag 08/04/19 0405)    And  diltiazem (CARDIZEM) 125 mg in dextrose 5% 125 mL (1 mg/mL) infusion (7.5 mg/hr Intravenous Rate/Dose Change 08/04/19 0421)  lactated ringers bolus 1,000 mL (1,000 mLs Intravenous New Bag/Given 08/04/19 0312)    And  lactated ringers bolus 1,000 mL (1,000 mLs Intravenous New Bag/Given 08/04/19 0325)    And  lactated ringers bolus 1,000 mL (1,000 mLs Intravenous New Bag/Given 08/04/19 0325)  acetaminophen (TYLENOL) suppository 650 mg (650 mg Rectal Given 08/04/19 0326)  ceFEPIme (MAXIPIME) 2 g in sodium chloride 0.9 % 100 mL IVPB (2 g Intravenous New Bag/Given 08/04/19 0357)    ED Course  I have reviewed the triage vital signs and the nursing notes.  Pertinent labs &  imaging results that were available during my care of the patient were reviewed by me and considered in my medical decision making (see chart for details).    MDM Rules/Calculators/A&P                      Patient was started on sepsis protocol without calling code sepsis because her blood pressure is fine.  She was given a 30 cc/kg bolus.  Her fever was treated with acetaminophen rectally.  Patient's Hemoccult was positive and there was bright red blood mixed with stool.  Her hemoglobin however is stable.  Her white blood cell count however now is very elevated consistent with her fever.  Patient denies abdominal pain.  After reviewing her chest x-ray, her UA was still pending.  She was started on antibiotics for fever with unknown source.  After getting the rectal acetaminophen she still remained in atrial fib with a fast rate, she was given Cardizem bolus and started on a drip to control the right of her atrial fib.  Recheck at 4:15 AM patient's denies having any shortness of breath.  She states she feels dizzy.  She is breathing much more comfortably now than she was when she first arrived.  She seems less agitated now than she did when she first arrived.  Patient was admitted 4/18 through 4/20 four acute on chronic respiratory failure with hypoxia.  At that time she was noted to have CHF, PAF, Alzheimer's, cirrhosis of the liver, diabetes type 2, COPD exacerbation, coronary artery disease,  and essential hypertension.  She also had fever at that admission.  Her Covid test was negative.  4:40 AM Dr. Humphrey Rolls, hospitalist will admit  Final Clinical Impression(s) / ED Diagnoses Final diagnoses:  Fever, unspecified fever cause  Rectal bleeding  Atrial fibrillation with rapid ventricular response [facility])    Rx / DC Orders  Plan admission  Rolland Porter, MD, Barbette Or, MD 08/04/19 [phone]

## 2019-08-04 NOTE — ED Notes (Signed)
Hemoccult positive for blood,

## 2019-08-04 NOTE — ED Triage Notes (Signed)
Pt in by [facility] from [facility].  Staff report pt was checked on this am and found to have rectal bleeding, soaked her underpad.  Pt is awake and alert, nad on arrival.

## 2019-08-04 NOTE — Progress Notes (Addendum)
Patient's emergency contact, MariJo, called unit for the second time since admission, expressing concern and requesting to speak with MD. Patient's contact reassured that she will have an opportunity to speak with the MD. Contact expresses concern about lactulose being held per MD order at this time and states that the patient is also a poor historian and is unable to communicate complete medical history. MD made aware and will call as time allows.

## 2019-08-05 DIAGNOSIS — K625 Hemorrhage of anus and rectum: Secondary | ICD-10-CM

## 2019-08-05 LAB — CBC
HCT: 35.1 % — ABNORMAL LOW (ref 36.0–46.0)
Hemoglobin: 11.3 g/dL — ABNORMAL LOW (ref 12.0–15.0)
MCH: 31.8 pg (ref 26.0–34.0)
MCHC: 32.2 g/dL (ref 30.0–36.0)
MCV: 98.9 fL (ref 80.0–100.0)
Platelets: 123 10*3/uL — ABNORMAL LOW (ref 150–400)
RBC: 3.55 MIL/uL — ABNORMAL LOW (ref 3.87–5.11)
RDW: 16.1 % — ABNORMAL HIGH (ref 11.5–15.5)
WBC: 14.1 10*3/uL — ABNORMAL HIGH (ref 4.0–10.5)
nRBC: 0 % (ref 0.0–0.2)

## 2019-08-05 LAB — URINE CULTURE: Culture: NO GROWTH

## 2019-08-05 LAB — BASIC METABOLIC PANEL
Anion gap: 8 (ref 5–15)
BUN: 12 mg/dL (ref 8–23)
CO2: 30 mmol/L (ref 22–32)
Calcium: 8.2 mg/dL — ABNORMAL LOW (ref 8.9–10.3)
Chloride: 104 mmol/L (ref 98–111)
Creatinine, Ser: 0.57 mg/dL (ref 0.44–1.00)
GFR calc Af Amer: >60 mL/min (ref >60)
GFR calc non Af Amer: >60 mL/min (ref >60)
Glucose, Bld: 121 mg/dL — ABNORMAL HIGH (ref 70–99)
Potassium: 3.3 mmol/L — ABNORMAL LOW (ref 3.5–5.1)
Sodium: 142 mmol/L (ref 135–145)

## 2019-08-05 LAB — GLUCOSE, CAPILLARY
Glucose-Capillary: 132 mg/dL — ABNORMAL HIGH (ref 70–99)
Glucose-Capillary: 118 mg/dL — ABNORMAL HIGH (ref 70–99)
Glucose-Capillary: 122 mg/dL — ABNORMAL HIGH (ref 70–99)
Glucose-Capillary: 189 mg/dL — ABNORMAL HIGH (ref 70–99)

## 2019-08-05 MED ORDER — DILTIAZEM HCL 30 MG PO TABS
30.0000 mg | ORAL_TABLET | Freq: Once | ORAL | Status: AC
  Administered 2019-08-05: 30 mg via ORAL
  Filled 2019-08-05: qty 1

## 2019-08-05 MED ORDER — HYDROCORTISONE (PERIANAL) 2.5 % EX CREA
TOPICAL_CREAM | Freq: Four times a day (QID) | CUTANEOUS | Status: DC
  Administered 2019-08-06: 1 via RECTAL
  Filled 2019-08-05: qty 28.35

## 2019-08-05 MED ORDER — DILTIAZEM HCL ER COATED BEADS 240 MG PO CP24
240.0000 mg | ORAL_CAPSULE | Freq: Every day | ORAL | Status: DC
  Administered 2019-08-05 – 2019-08-06 (×2): 240 mg via ORAL
  Filled 2019-08-05 (×2): qty 1

## 2019-08-05 MED ORDER — METOPROLOL SUCCINATE ER 25 MG PO TB24
25.0000 mg | ORAL_TABLET | Freq: Every day | ORAL | Status: DC
  Administered 2019-08-05 – 2019-08-06 (×2): 25 mg via ORAL
  Filled 2019-08-05 (×2): qty 1

## 2019-08-05 NOTE — Progress Notes (Signed)
Patient Demographics:    Heather Newman, is a 79 y.o. female, DOB - Dec 11, 1940, TMH:[id number]  Admit date - 08/04/2019   Admitting Physician Meira Wahba Denton Brick, MD  Outpatient Primary MD for the patient is [name], NP  LOS - 1   Chief Complaint  Patient presents with  . GI Bleeding        Subjective:    [name] today has no fevers, no emesis,  No chest pain,  -Patient granddaughter [name] at bedside, questions answered -No further rectal bleeding tolerating Anusol HC cream well rectally -Tolerated full liquid diet well, she is willing to try soft diet -More interactive able to give more history -RN Lilia Pro at bedside  -Granddaughter's-female friend/room-mate MariJo--- was verbally abusive, belligerent and loud--- security was called to be on standby in case she became more threatening  --I have told patient's granddaughter [name] that I will be happy to have conversations with her directly,  --since [facility] is not a relative and Not an official POA--- and given difficulties with communication with MariJo we will limit conversations with [facility]  --- Will continue to have conversations with patient's granddaughter [name]   Assessment  & Plan :    Principal Problem:   Rectal bleeding Active Problems:   Hyperglycemia due to diabetes mellitus ([place])   Atrial fibrillation with RVR ([place])   SIRS (systemic inflammatory response syndrome) ([place])   Sepsis ([place])  Brief Summary 79 year old with advanced dementia, COPD/bronchiectasis HTN, DM, chronic hypoxic respiratory failure on 2 L of oxygen chronically, liver cirrhosis, chronic atrial fibrillation not on anticoagulation, admitted on 11/28/1192 from [facility] with concerns about rectal bleeding and possible sepsis   A/p 1)SIRS with concerns for sepsis----on admission patient met  SIRS criteria with leukocytosis, tachycardia and  tachypnea--- no evidence of infection at this time, currently on cefepime on Vanco pending cultures -Stop vancomycin as MRSA PCR is negative, patient is afebrile -WBC 14.1 -Lactic acid is not elevated  2)Rectal bleeding--- hemoglobin on admission was 14.5 down to 11.3 with hydration, however patient's baseline hemoglobin is usually around 12--probably hemoconcentrated initially on admission -Follow H&H and get GI consult if indicated -Discussed with Dr. Oneida Alar from GI service given lack of further rectal bleeding and stable H&H no indication for endoluminal evaluation at this time -Anusol HC cream recommended 4 times a day for up to 21 days  3)DM2-A1c 8.0, reflecting uncontrolled DM PTA,  -Patient was on Lantus 10 units twice daily and Metformin Use Novolog/Humalog Sliding scale insulin with Accu-Cheks/Fingersticks as ordered   4)Paroxysmal  A. fib with RVR--- -transition from IV Cardizem to p.o. Cardizem CD 240 mg daily, --Toprol-XL decreased to 25 mg twice daily due to soft BP  patient was not on anticoagulation PTA due to bleeding concerns -Recent echo with EF of 55% -Currently with concerns about rectal bleeding, anticoagulation contraindicated at this time  5)Dementia--with baseline memory and cognitive deficits, -Continue Remeron  6)CAD--stable, chest pain-free, hold aspirin due to rectal bleeding -Toprol-XL decreased to 25 mg twice daily due to soft BP  7)COPD/emphysema--stable, continue Dulera, as needed neb treatments  8)H/o NASH Liver Cirrhosis--- hold lactulose due to GI bleed -Patient is not encephalopathic, serum ammonia 30  9)H/o prior stroke in the setting of atrial fibrillation---  hold aspirin, risk versus benefits of full anticoagulation discussed with patient's granddaughter--- declines anticoagulation in the future due to increased bleeding risk  Social/Ethics-patient is a full code --Granddaughter's-female friend/room-mate MariJo--- was verbally abusive,  belligerent and loud--- security was called to be on standby in case she became more threatening --I have told patient's granddaughter [name] that I will be happy to have conversations with her directly,  --since [facility] is not a relative and Not an official POA--- and given difficulties with communication with MariJo we will limit conversations with [facility]  --- Will continue to have conversations with patient's granddaughter [name]  Disposition/Need for in-Hospital Stay-  patient unable to be discharged at this time due to concerns about rectal bleeding, possible sepsis requiring IV antibiotics and rate control -Patient From: [facility] D/C Place: [facility] Barriers: Not Clinically Stable-   Code Status : full code  Family Communication:    (patient is alert, awake and coherent) --Discussed with patient's granddaughter [name] who is visiting  Consults  : Curbside conversations with Dr. Oneida Alar from GI service  DVT Prophylaxis  :    - SCDs (Gi Bleed)   Lab Results  Component Value Date   PLT 123 (L) 08/05/2019    Inpatient Medications  Scheduled Meds: . Chlorhexidine Gluconate Cloth  6 each Topical Daily  . diltiazem  240 mg Oral Daily  . fluticasone  1 spray Each Nare Daily  . gabapentin  300 mg Oral BID  . hydrocortisone   Rectal QID  . insulin aspart  0-15 Units Subcutaneous TID WC  . insulin aspart  0-5 Units Subcutaneous QHS  . ipratropium-albuterol  3 mL Nebulization BID  . mouth rinse  15 mL Mouth Rinse BID  . metoprolol succinate  25 mg Oral Daily  . mirtazapine  15 mg Oral QHS  . mometasone-formoterol  2 puff Inhalation BID  . pantoprazole (PROTONIX) IV  40 mg Intravenous Q12H   Continuous Infusions: . ceFEPime (MAXIPIME) IV Stopped (08/05/19 1430)  . vancomycin Stopped (08/05/19 0643)   PRN Meds:.acetaminophen **OR** acetaminophen, albuterol, meclizine, ondansetron **OR** ondansetron (ZOFRAN) IV    Anti-infectives (From admission, onward)   Start      Dose/Rate Route Frequency Ordered Stop   08/04/19 1800  vancomycin (VANCOCIN) IVPB 1000 mg/200 mL premix     1,000 mg 200 mL/hr over 60 Minutes Intravenous Every 12 hours 08/04/19 0618     08/04/19 1000  ceFEPIme (MAXIPIME) 2 g in sodium chloride 0.9 % 100 mL IVPB     2 g 200 mL/hr over 30 Minutes Intravenous Every 8 hours 08/04/19 0618     08/04/19 0400  ceFEPIme (MAXIPIME) 2 g in sodium chloride 0.9 % 100 mL IVPB     2 g 200 mL/hr over 30 Minutes Intravenous  Once 08/04/19 0345 08/04/19 0510   08/04/19 0400  metroNIDAZOLE (FLAGYL) IVPB 500 mg     500 mg 100 mL/hr over 60 Minutes Intravenous  Once 08/04/19 0345 08/04/19 0511   08/04/19 0400  vancomycin (VANCOCIN) IVPB 1000 mg/200 mL premix     1,000 mg 200 mL/hr over 60 Minutes Intravenous  Once 08/04/19 0345 08/04/19 0511        Objective:   Vitals:   08/05/19 1300 08/05/19 1400 08/05/19 1500 08/05/19 1600  BP: (!) 100/51 (!) 125/57 104/61 119/62  Pulse: 92 (!) 101 90 94  Resp: (!) 25 (!) 25 19 (!) 25  Temp:      TempSrc:      SpO2: 93%  95% 92% 93%  Weight:      Height:        Wt Readings from Last 3 Encounters:  08/05/19 83.7 kg  07/17/19 84 kg  05/16/19 80.8 kg     Intake/Output Summary (Last 24 hours) at 08/05/2019 1754 Last data filed at 08/05/2019 1600 Gross per 24 hour  Intake 1028.59 ml  Output 400 ml  Net 628.59 ml    Physical Exam  Gen:- Awake Alert,  In no apparent distress  HEENT:- [place].AT, No sclera icterus Neck-Supple Neck,No JVD,.  Lungs-diminished in bases, no wheezing  CV- S1, S2 normal, irregularly irregular Abd-  +ve B.Sounds, Abd Soft, No tenderness,    Extremity :- No  edema, pedal pulses present  Psych-affect is appropriate, oriented x3 Neuro-generalized weakness, no new focal deficits, no tremors  --Skin--erythematous rash around her buttocks with satellite lesions suggestive of Candida intertrigo--see photos in epic - Media Information   Document Information  Photos  Buttocks     08/04/2019 16:30  Attached To:  Hospital Encounter on 08/04/19  Source Information  Varsha Knock, MD  [facility]     Data Review:   Micro Results Recent Results (from the past 240 hour(s))  Blood Culture (routine x 2)     Status: None (Preliminary result)   Collection Time: 08/04/19  3:11 AM   Specimen: BLOOD LEFT FOREARM  Result Value Ref Range Status   Specimen Description BLOOD LEFT FOREARM  Final   Special Requests   Final    BOTTLES DRAWN AEROBIC AND ANAEROBIC Blood Culture adequate volume   Culture   Final    NO GROWTH 1 DAY Performed at [facility], [address], [place], [place] [address]    Report Status PENDING  Incomplete  Blood Culture (routine x 2)     Status: None (Preliminary result)   Collection Time: 08/04/19  3:20 AM   Specimen: Left Antecubital; Blood  Result Value Ref Range Status   Specimen Description LEFT ANTECUBITAL  Final   Special Requests   Final    BOTTLES DRAWN AEROBIC AND ANAEROBIC Blood Culture adequate volume   Culture   Final    NO GROWTH 1 DAY Performed at [facility], [address] [place], [place] [address]    Report Status PENDING  Incomplete  Respiratory Panel by RT PCR (Flu A&B, Covid) - Nasopharyngeal Swab     Status: None   Collection Time: 08/04/19  3:22 AM   Specimen: Nasopharyngeal Swab  Result Value Ref Range Status   SARS Coronavirus 2 by RT PCR NEGATIVE NEGATIVE Final    Comment: (NOTE) SARS-CoV-2 target nucleic acids are NOT DETECTED. The SARS-CoV-2 RNA is generally detectable in upper respiratoy specimens during the acute phase of infection. The lowest concentration of SARS-CoV-2 viral copies this assay can detect is 131 copies/mL. A negative result does not preclude SARS-Cov-2 infection and should not be used as the sole basis for treatment or other patient management decisions. A negative result may occur with  improper specimen collection/handling, submission of specimen other than nasopharyngeal  swab, presence of viral mutation(s) within the areas targeted by this assay, and inadequate number of viral copies (<131 copies/mL). A negative result must be combined with clinical observations, patient history, and epidemiological information. The expected result is Negative. Fact Sheet for Patients:  PinkCheek.be Fact Sheet for Healthcare Providers:  GravelBags.it This test is not yet ap proved or cleared by the [place] FDA and  has been authorized for detection and/or diagnosis  of SARS-CoV-2 by FDA under an Emergency Use Authorization (EUA). This EUA will remain  in effect (meaning this test can be used) for the duration of the COVID-19 declaration under Section 564(b)(1) of the Act, 21 U.S.C. section 360bbb-3(b)(1), unless the authorization is terminated or revoked sooner.    Influenza A by PCR NEGATIVE NEGATIVE Final   Influenza B by PCR NEGATIVE NEGATIVE Final    Comment: (NOTE) The Xpert Xpress SARS-CoV-2/FLU/RSV assay is intended as an aid in  the diagnosis of influenza from Nasopharyngeal swab specimens and  should not be used as a sole basis for treatment. Nasal washings and  aspirates are unacceptable for Xpert Xpress SARS-CoV-2/FLU/RSV  testing. Fact Sheet for Patients: PinkCheek.be Fact Sheet for Healthcare Providers: GravelBags.it This test is not yet approved or cleared by the [place] FDA and  has been authorized for detection and/or diagnosis of SARS-CoV-2 by  FDA under an Emergency Use Authorization (EUA). This EUA will remain  in effect (meaning this test can be used) for the duration of the  Covid-19 declaration under Section 564(b)(1) of the Act, 21  U.S.C. section 360bbb-3(b)(1), unless the authorization is  terminated or revoked. Performed at [facility], [address] [place], [place] [address]   Urine culture     Status: None    Collection Time: 08/04/19  3:39 AM   Specimen: In/Out Cath Urine  Result Value Ref Range Status   Specimen Description   Final    IN/OUT CATH URINE Performed at [facility], [address], [place], [place] [address]    Special Requests   Final    NONE Performed at [facility], [address] [place], [place] [address]    Culture   Final    NO GROWTH Performed at [facility], [place] [address] [place], [place] [address]    Report Status 08/05/2019 FINAL  Final  MRSA PCR Screening     Status: None   Collection Time: 08/04/19  1:05 PM   Specimen: Nasopharyngeal  Result Value Ref Range Status   MRSA by PCR NEGATIVE NEGATIVE Final    Comment:        The GeneXpert MRSA Assay (FDA approved for NASAL specimens only), is one component of a comprehensive MRSA colonization surveillance program. It is not intended to diagnose MRSA infection nor to guide or monitor treatment for MRSA infections. Performed at [facility], [address] [place], [place] [address]    Radiology Reports DG Chest [place] 1 View  Result Date: 08/04/2019 CLINICAL DATA:  Fever, rectal bleeding EXAM: PORTABLE CHEST 1 VIEW COMPARISON:  07/15/2019 FINDINGS: Single frontal view of the chest demonstrates an enlarged cardiac silhouette, stable. Chronic central vascular congestion without airspace disease, effusion, or pneumothorax. No acute bony abnormalities. IMPRESSION: 1. Stable chest, no acute process. Electronically Signed   By: Randa Ngo M.D.   On: 08/04/2019 03:35   DG Chest Portable 1 View  Result Date: 07/15/2019 CLINICAL DATA:  Shortness of breath EXAM: PORTABLE CHEST 1 VIEW COMPARISON:  05/12/2019 FINDINGS: Mild cardiomegaly. Lingular atelectasis. No effusions or overt edema. No acute bony abnormality. IMPRESSION: Cardiomegaly, lingular atelectasis. Electronically Signed   By: Rolm Baptise M.D.   On: 07/15/2019 19:19     CBC Recent Labs  Lab 08/04/19 0311 08/04/19 0721 08/04/19 1939  08/05/19 0428  WBC 20.4* 22.3* 15.6* 14.1*  HGB 14.5 11.7* 12.1 11.3*  HCT 45.1 37.1 37.7 35.1*  PLT 205 165 129* 123*  MCV 98.9 99.5 99.2 98.9  MCH 31.8 31.4 31.8 31.8  MCHC 32.2 31.5 32.1 32.2  RDW 16.1* 16.0* 16.0* 16.1*  LYMPHSABS 0.7  --   --   --   MONOABS 1.5*  --   --   --   EOSABS 0.2  --   --   --   BASOSABS 0.1  --   --   --     Chemistries  Recent Labs  Lab 08/04/19 0311 08/05/19 0428  NA 139 142  K 3.7 3.3*  CL 101 104  CO2 28 30  GLUCOSE 224* 121*  BUN 12 12  CREATININE 0.62 0.57  CALCIUM 9.1 8.2*  AST 24  --   ALT 30  --   ALKPHOS 138*  --   BILITOT 1.8*  --    ------------------------------------------------------------------------------------------------------------------ No results for input(s): CHOL, HDL, LDLCALC, TRIG, CHOLHDL, LDLDIRECT in the last 72 hours.  Lab Results  Component Value Date   HGBA1C 8.0 (H) 08/04/2019   ------------------------------------------------------------------------------------------------------------------ No results for input(s): TSH, T4TOTAL, T3FREE, THYROIDAB in the last 72 hours.  Invalid input(s): FREET3 ------------------------------------------------------------------------------------------------------------------ No results for input(s): VITAMINB12, FOLATE, FERRITIN, TIBC, IRON, RETICCTPCT in the last 72 hours.  Coagulation profile Recent Labs  Lab 08/04/19 0311  INR 1.1    No results for input(s): DDIMER in the last 72 hours.  Cardiac Enzymes No results for input(s): CKMB, TROPONINI, MYOGLOBIN in the last 168 hours.  Invalid input(s): CK ------------------------------------------------------------------------------------------------------------------    Component Value Date/Time   BNP 696.0 (H) 05/13/2019 2119   Roxan Hockey M.D on 08/05/2019 at 5:54 PM  Go to [email or web address] - for contact info  [facility] - Office  [phone]

## 2019-08-06 LAB — BASIC METABOLIC PANEL
Anion gap: 6 (ref 5–15)
BUN: 11 mg/dL (ref 8–23)
CO2: 29 mmol/L (ref 22–32)
Calcium: 8.1 mg/dL — ABNORMAL LOW (ref 8.9–10.3)
Chloride: 106 mmol/L (ref 98–111)
Creatinine, Ser: 0.56 mg/dL (ref 0.44–1.00)
GFR calc Af Amer: >60 mL/min (ref >60)
GFR calc non Af Amer: >60 mL/min (ref >60)
Glucose, Bld: 132 mg/dL — ABNORMAL HIGH (ref 70–99)
Potassium: 3.7 mmol/L (ref 3.5–5.1)
Sodium: 141 mmol/L (ref 135–145)

## 2019-08-06 LAB — CBC
HCT: 34.3 % — ABNORMAL LOW (ref 36.0–46.0)
Hemoglobin: 10.9 g/dL — ABNORMAL LOW (ref 12.0–15.0)
MCH: 32 pg (ref 26.0–34.0)
MCHC: 31.8 g/dL (ref 30.0–36.0)
MCV: 100.6 fL — ABNORMAL HIGH (ref 80.0–100.0)
Platelets: 128 10*3/uL — ABNORMAL LOW (ref 150–400)
RBC: 3.41 MIL/uL — ABNORMAL LOW (ref 3.87–5.11)
RDW: 16.2 % — ABNORMAL HIGH (ref 11.5–15.5)
WBC: 10.7 10*3/uL — ABNORMAL HIGH (ref 4.0–10.5)
nRBC: 0 % (ref 0.0–0.2)

## 2019-08-06 LAB — GLUCOSE, CAPILLARY
Glucose-Capillary: 133 mg/dL — ABNORMAL HIGH (ref 70–99)
Glucose-Capillary: 248 mg/dL — ABNORMAL HIGH (ref 70–99)

## 2019-08-06 LAB — OCCULT BLOOD, POC DEVICE: Fecal Occult Bld: POSITIVE — AB

## 2019-08-06 MED ORDER — NYSTATIN-TRIAMCINOLONE 100000-0.1 UNIT/GM-% EX CREA: 1.0000 "application " | TOPICAL_CREAM | Freq: Two times a day (BID) | CUTANEOUS | 0 refills | Status: DC

## 2019-08-06 MED ORDER — ASPIRIN EC 81 MG PO TBEC: 81.0000 mg | DELAYED_RELEASE_TABLET | Freq: Every day | ORAL | 2 refills | Status: DC

## 2019-08-06 MED ORDER — METOPROLOL SUCCINATE ER 25 MG PO TB24
25.0000 mg | ORAL_TABLET | Freq: Once | ORAL | Status: AC
  Administered 2019-08-06: 25 mg via ORAL
  Filled 2019-08-06: qty 1

## 2019-08-06 MED ORDER — LACTULOSE 10 GM/15ML PO SOLN: 20.0000 g | Freq: Three times a day (TID) | ORAL | 3 refills | Status: DC

## 2019-08-06 MED ORDER — INSULIN ASPART 100 UNIT/ML FLEXPEN: 0.0000 [IU] | PEN_INJECTOR | SUBCUTANEOUS | 11 refills | Status: DC

## 2019-08-06 MED ORDER — HYDROCORTISONE (PERIANAL) 2.5 % EX CREA: TOPICAL_CREAM | Freq: Four times a day (QID) | CUTANEOUS | 0 refills | Status: DC

## 2019-08-06 MED ORDER — METOPROLOL SUCCINATE ER 50 MG PO TB24: 50.0000 mg | ORAL_TABLET | Freq: Once | ORAL | Status: DC

## 2019-08-06 NOTE — Progress Notes (Signed)
Patient alert and oriented x3. No complaints of pain, shortness of breath, chest pain, dizziness, nausea or vomiting. [place] daughter and friend in to visit, emotional support provided. Patient tolerated PO meds and diet well. Appetite good. IV's removed without complications. Nurse to nurse report called and given to [place] at [facility]. Dr Joesph Fillers notified that discharge summary stated to pick up prescriptions at [facility] to be changed to [facility] and given fax number prior to discharge (fax # [phone]). Patient discharged back to [facility] via EMS.
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
## 2019-08-06 NOTE — NC FL2 (Signed)
[place] LEVEL OF CARE SCREENING TOOL     IDENTIFICATION  Patient Name: Heather Newman Birthdate: 01-Feb-1941 Sex: female Admission Date (Current Location): 08/04/2019  [facility] and [place] Number:  [employer] and Address:  [place] [address] [place]      Provider Number: [id number]  Attending Physician Name and Address:  Roxan Hockey, MD  Relative Name and Phone Number:       Current Level of Care: Hospital Recommended Level of Care: [place] Prior Approval Number:    Date Approved/Denied:   PASRR Number:    Discharge Plan: SNF    Current Diagnoses: Patient Active Problem List   Diagnosis Date Noted  . Rectal bleeding 08/04/2019  . Hyperglycemia due to diabetes mellitus ([place]) 08/04/2019  . Atrial fibrillation with RVR ([place]) 08/04/2019  . SIRS (systemic inflammatory response syndrome) ([place]) 08/04/2019  . Sepsis ([place]) 08/04/2019  . Fever 07/16/2019  . Acute on chronic respiratory failure with hypoxia ([place]) 07/15/2019  . Hypophosphatemia   . CAD (coronary artery disease)   . Essential hypertension   . Atrial flutter with rapid ventricular response ([place])   . History of cardiomyopathy   . Pressure injury of skin 05/13/2019  . Acute respiratory failure with hypoxia and hypercapnia ([place]) 05/12/2019  . COPD exacerbation ([place]) 05/12/2019  . Pressure injury of skin of left buttock 04/03/2019  . Pressure injury of heel, stage 2 ([place]) 04/03/2019  . Diabetes mellitus type 2 with complications ([place]) [id number]  . Nail overgrowth 03/29/2019  . Pressure injury of left heel, stage 2 ([place]) 03/29/2019  . Obstructive sleep apnea (adult) (pediatric)   . Altered mental status, unspecified   . Atherosclerosis of native arteries of right leg with ulceration of other part of foot ([place])   . Atrial premature depolarization   . Calculus of ureter   . Chronic systolic (congestive) heart failure ([place])   .  Cognitive communication deficit   . Dementia in other diseases classified elsewhere without behavioral disturbance ([place])   . Emphysema, unspecified ([place])   . Immobility syndrome (paraplegic)   . Left bundle-branch block, unspecified   . Major depressive disorder, single episode, unspecified   . Acute metabolic encephalopathy   . Other disorders of peripheral nervous system   . Other hypersomnia   . Other symptoms and signs concerning food and fluid intake   . Pain, unspecified   . Sleep disorder   . Stereotyped movement disorders   . PAF (paroxysmal atrial fibrillation) ([place])   . Alzheimer's disease, unspecified (CODE) ([place])   . Coronary artery disease of native artery of native heart with stable angina pectoris ([place])   . Bilateral primary osteoarthritis of knee   . Circadian rhythm sleep disorder, advanced sleep phase type   . Constipation, unspecified   . Difficulty in walking, not elsewhere classified   . Insomnia due to medical condition   . Erythematous condition, unspecified   . Localized edema   . Melanocytic nevi, unspecified   . Other cirrhosis of liver ([place])   . Other fatigue   . Snoring   . Other symptoms and signs involving the musculoskeletal system   . Vitamin D deficiency, unspecified     Orientation RESPIRATION BLADDER Height & Weight     Self  Normal Incontinent Weight: 184 lb 8.4 oz (83.7 kg) Height:  5\' 8"  (172.7 cm)  BEHAVIORAL SYMPTOMS/MOOD NEUROLOGICAL BOWEL NUTRITION STATUS      Incontinent Diet(see dc summary)  AMBULATORY STATUS COMMUNICATION OF NEEDS Skin   Extensive Assist Verbally Normal                       Personal Care Assistance Level of Assistance  Bathing, Feeding, Dressing Bathing Assistance: Maximum assistance Feeding assistance: Limited assistance Dressing Assistance: Maximum assistance     Functional Limitations Info  Sight, Hearing, Speech Sight Info: Adequate Hearing Info: Adequate Speech Info: Adequate    SPECIAL  CARE FACTORS FREQUENCY  PT (By licensed PT), OT (By licensed OT)     PT Frequency: 5x week OT Frequency: 3x week            Contractures Contractures Info: Not present    Additional Factors Info  Code Status, Allergies Code Status Info: Full Allergies Info: NKA           Current Medications (08/06/2019):  This is the current hospital active medication list Current Facility-Administered Medications  Medication Dose Route Frequency Provider Last Rate Last Admin  . acetaminophen (TYLENOL) tablet 650 mg  650 mg Oral Q6H PRN Bunnie Pion Z, DO   650 mg at 08/05/19 2119   Or  . acetaminophen (TYLENOL) suppository 650 mg  650 mg Rectal Q6H PRN Bunnie Pion Z, DO   650 mg at 08/04/19 2354  . albuterol (PROVENTIL) (2.5 MG/3ML) 0.083% nebulizer solution 2.5 mg  2.5 mg Nebulization Q6H PRN Bunnie Pion Z, DO   2.5 mg at 08/04/19 1645  . ceFEPIme (MAXIPIME) 2 g in sodium chloride 0.9 % 100 mL IVPB  2 g Intravenous Q8H Bunnie Pion Z, DO 200 mL/hr at 08/06/19 0606 2 g at 08/06/19 0606  . Chlorhexidine Gluconate Cloth 2 % PADS 6 each  6 each Topical Daily Roxan Hockey, MD   6 each at 08/06/19 0901  . diltiazem (CARDIZEM CD) 24 hr capsule 240 mg  240 mg Oral Daily Emokpae, Courage, MD   240 mg at 08/06/19 0903  . fluticasone (FLONASE) 50 MCG/ACT nasal spray 1 spray  1 spray Each Nare Daily Bunnie Pion Z, DO   1 spray at 08/06/19 [id number]  . gabapentin (NEURONTIN) capsule 300 mg  300 mg Oral BID Bunnie Pion Z, DO   300 mg at 08/06/19 [id number]  . hydrocortisone (ANUSOL-HC) 2.5 % rectal cream   Rectal QID Danie Binder, MD   1 application at [id number] [phone]  . insulin aspart (novoLOG) injection 0-15 Units  0-15 Units Subcutaneous TID WC Bunnie Pion Z, DO   2 Units at 08/06/19 0810  . insulin aspart (novoLOG) injection 0-5 Units  0-5 Units Subcutaneous QHS Bunnie Pion Z, DO   3 Units at 08/04/19 2146  . ipratropium-albuterol (DUONEB) 0.5-2.5 (3) MG/3ML nebulizer solution 3 mL  3 mL  Nebulization BID [place] Brick, Courage, MD   3 mL at 08/06/19 0801  . meclizine (ANTIVERT) tablet 25 mg  25 mg Oral TID PRN Bunnie Pion Z, DO      . MEDLINE mouth rinse  15 mL Mouth Rinse BID [place] Brick, Courage, MD   15 mL at 08/06/19 0904  . metoprolol succinate (TOPROL-XL) 24 hr tablet 25 mg  25 mg Oral Daily Emokpae, Courage, MD   25 mg at 08/06/19 0902  . mirtazapine (REMERON) tablet 15 mg  15 mg Oral QHS Bunnie Pion Z, DO   15 mg at 08/05/19 2119  . mometasone-formoterol (DULERA) 100-5 MCG/ACT inhaler 2 puff  2 puff Inhalation BID Bunnie Pion Z, DO   2 puff at 08/06/19 0805  .  ondansetron (ZOFRAN) tablet 4 mg  4 mg Oral Q6H PRN Bunnie Pion Z, DO       Or  . ondansetron [facility]) injection 4 mg  4 mg Intravenous Q6H PRN Bunnie Pion Z, DO      . pantoprazole (PROTONIX) injection 40 mg  40 mg Intravenous Q12H Bunnie Pion Z, DO   40 mg at 08/06/19 0901     Discharge Medications: Please see discharge summary for a list of discharge medications.  Relevant Imaging Results:  Relevant Lab Results:   Additional Information    Shade Flood, LCSW

## 2019-08-06 NOTE — Discharge Instructions (Signed)
1)insulin aspart (novoLOG) injection 0- 6 Units 0- 6 Units Subcutaneous, 3 times daily with meals CBG < 70: Implement Hypoglycemia Standing Orders, -- CBG 70 - 120: 0 unit CBG 121 - 150: 0 unit  CBG 151 - 200: 0 unit CBG 201 - 250: 1 units CBG 251 - 300: 2 units CBG 301 - 350: 4 units  CBG 351 - 400: 6 units  CBG > 400: 6 units  2)Take Lactulose 20 g (53ml) up to 3 times a day--- goal is to have 2 to 3 mushy soft stools per day, avoid constipation, avoid watery diarrhea type stools  3)Please repeat CBC on Thursday to 5/13/ 2021  4)Please watch for further rectal bleeding  5)Avoid ibuprofen/Advil/Aleve/Motrin/Goody Powders/Naproxen/BC powders/Meloxicam/Diclofenac/Indomethacin and other Nonsteroidal anti-inflammatory medications as these will make you more likely to bleed and can cause stomach ulcers, can also cause Kidney problems.   6)Patient's Primary contact is her granddaughter Ms. [name]  7)Hold aspirin until 08/10/2019

## 2019-08-06 NOTE — TOC Transition Note (Signed)
Transition of Care [facility]) - CM/SW Discharge Note   Patient Details  Name: Heather Newman MRN: [id number] Date of Birth: [birth year]  [facility] CM/SW Contact:  Shade Flood, LCSW Phone Number: 08/06/2019, 2:30 PM   Clinical Narrative:     Pt stable for dc today per MD. Pt admitted from [facility] rehab. Updated Debbie at [facility] and pt can return today. MD updated pt's granddaughter. DC clinical sent electronically. RN to call report. EMS form printed to the floor. Called EMS to request transport.   There are no other TOC needs for dc.  Final next level of care: Skilled Nursing Facility Barriers to Discharge: Barriers Resolved   Patient Goals and CMS Choice        Discharge Placement                       Discharge Plan and Services                                     Social Determinants of Health (SDOH) Interventions     Readmission Risk Interventions No flowsheet data found.

## 2019-08-06 NOTE — Progress Notes (Signed)
  I attempted to call pt's grand-daughter [name] at listed number, her room-mate Ms [facility] answered the phone---  --I requested to speak with patient's granddaughter, Ms Idalia Needle stated that [name] was not available to talk to me  - I requested that patient's granddaughter call us back when she was able to talk - Ms Idalia Needle started yelling--and insisted that I tell her what ever I wanted to tell [name] -- -Despite pleading to Ms. MariJo to pass along the information to patient's granddaughter she just kept yelling and demanding that I talk to her rather than talking to patient's granddaughter   - We will await callback from patient's granddaughter to notify her that her grandmother is being discharged back to [facility]  Roxan Hockey, MD

## 2019-08-07 ENCOUNTER — Telehealth: Payer: Self-pay | Admitting: *Deleted

## 2019-08-07 NOTE — Telephone Encounter (Signed)
Spoke with Heather Newman on DPR pt is now a long term pt at [facility] now and is followed by their dr did TOC call today and took her off your pt list

## 2019-08-07 NOTE — Telephone Encounter (Signed)
Transition Care Management Follow-up Telephone Call   Date discharged? 08/06/19       How have you been since you were released from the hospital? she does not like dr courage however she is feeling better    Do you understand why you were in the hospital? Yes     Do you understand the discharge instructions? Yes    Where were you discharged to? [facility] (LONG TERM)    Items Reviewed:  Medications reviewed: yes  Allergies reviewed: yes   Dietary changes reviewed:yes     Referrals reviewed: yes    Functional Questionnaire:   Activities of Daily Living (ADLs):  Long term in [place] [facility]    Any transportation issues/concerns?: would need to be done through nursing home    Any patient concerns? Family was concerned because she was put back on laxatives    Confirmed importance and date/time of follow-up visits scheduled yes      Confirmed with patient if condition begins to worsen call PCP or go to the ER.  Patient was given the office number and encouraged to call back with question or concerns.  : yes

## 2019-08-07 NOTE — Telephone Encounter (Signed)
Noted, thank you for update. I wish her well.

## 2019-08-10 LAB — CULTURE, BLOOD (ROUTINE X 2)
Culture: NO GROWTH
Culture: NO GROWTH
Special Requests: ADEQUATE
Special Requests: ADEQUATE

## 2019-08-17 ENCOUNTER — Encounter (HOSPITAL_COMMUNITY): Payer: Self-pay | Admitting: *Deleted

## 2019-08-17 ENCOUNTER — Emergency Department (HOSPITAL_COMMUNITY): Payer: Medicare Other

## 2019-08-17 ENCOUNTER — Inpatient Hospital Stay (HOSPITAL_COMMUNITY)
Admission: EM | Admit: 2019-08-17 | Discharge: 2019-08-20 | DRG: 442 | Disposition: A | Discharge status: Other Acute Inpatient Hospital | Payer: Medicare Other | Attending: Family Medicine | Admitting: Family Medicine

## 2019-08-17 DIAGNOSIS — J449 Chronic obstructive pulmonary disease, unspecified: Secondary | ICD-10-CM | POA: Diagnosis present

## 2019-08-17 DIAGNOSIS — Z833 Family history of diabetes mellitus: Secondary | ICD-10-CM | POA: Diagnosis not present

## 2019-08-17 DIAGNOSIS — Z20822 Contact with and (suspected) exposure to covid-19: Secondary | ICD-10-CM | POA: Diagnosis present

## 2019-08-17 DIAGNOSIS — K7581 Nonalcoholic steatohepatitis (NASH): Secondary | ICD-10-CM | POA: Diagnosis present

## 2019-08-17 DIAGNOSIS — Z7982 Long term (current) use of aspirin: Secondary | ICD-10-CM

## 2019-08-17 DIAGNOSIS — F329 Major depressive disorder, single episode, unspecified: Secondary | ICD-10-CM | POA: Diagnosis present

## 2019-08-17 DIAGNOSIS — I480 Paroxysmal atrial fibrillation: Secondary | ICD-10-CM | POA: Diagnosis present

## 2019-08-17 DIAGNOSIS — Z794 Long term (current) use of insulin: Secondary | ICD-10-CM

## 2019-08-17 DIAGNOSIS — R21 Rash and other nonspecific skin eruption: Secondary | ICD-10-CM | POA: Diagnosis present

## 2019-08-17 DIAGNOSIS — E559 Vitamin D deficiency, unspecified: Secondary | ICD-10-CM | POA: Diagnosis present

## 2019-08-17 DIAGNOSIS — G4733 Obstructive sleep apnea (adult) (pediatric): Secondary | ICD-10-CM | POA: Diagnosis present

## 2019-08-17 DIAGNOSIS — E1165 Type 2 diabetes mellitus with hyperglycemia: Secondary | ICD-10-CM | POA: Diagnosis present

## 2019-08-17 DIAGNOSIS — G309 Alzheimer's disease, unspecified: Secondary | ICD-10-CM | POA: Diagnosis present

## 2019-08-17 DIAGNOSIS — M170 Bilateral primary osteoarthritis of knee: Secondary | ICD-10-CM | POA: Diagnosis present

## 2019-08-17 DIAGNOSIS — K7290 Hepatic failure, unspecified without coma: Principal | ICD-10-CM | POA: Diagnosis present

## 2019-08-17 DIAGNOSIS — I5032 Chronic diastolic (congestive) heart failure: Secondary | ICD-10-CM | POA: Diagnosis present

## 2019-08-17 DIAGNOSIS — F17210 Nicotine dependence, cigarettes, uncomplicated: Secondary | ICD-10-CM | POA: Diagnosis present

## 2019-08-17 DIAGNOSIS — K7460 Unspecified cirrhosis of liver: Secondary | ICD-10-CM | POA: Diagnosis present

## 2019-08-17 DIAGNOSIS — K7682 Hepatic encephalopathy: Secondary | ICD-10-CM | POA: Diagnosis present

## 2019-08-17 DIAGNOSIS — I447 Left bundle-branch block, unspecified: Secondary | ICD-10-CM | POA: Diagnosis present

## 2019-08-17 DIAGNOSIS — E785 Hyperlipidemia, unspecified: Secondary | ICD-10-CM | POA: Diagnosis present

## 2019-08-17 DIAGNOSIS — Z79899 Other long term (current) drug therapy: Secondary | ICD-10-CM | POA: Diagnosis not present

## 2019-08-17 DIAGNOSIS — K219 Gastro-esophageal reflux disease without esophagitis: Secondary | ICD-10-CM | POA: Diagnosis present

## 2019-08-17 DIAGNOSIS — I429 Cardiomyopathy, unspecified: Secondary | ICD-10-CM | POA: Diagnosis present

## 2019-08-17 DIAGNOSIS — I2510 Atherosclerotic heart disease of native coronary artery without angina pectoris: Secondary | ICD-10-CM | POA: Diagnosis present

## 2019-08-17 DIAGNOSIS — I110 Hypertensive heart disease with heart failure: Secondary | ICD-10-CM | POA: Diagnosis present

## 2019-08-17 DIAGNOSIS — Z8679 Personal history of other diseases of the circulatory system: Secondary | ICD-10-CM

## 2019-08-17 DIAGNOSIS — I10 Essential (primary) hypertension: Secondary | ICD-10-CM | POA: Diagnosis present

## 2019-08-17 DIAGNOSIS — E118 Type 2 diabetes mellitus with unspecified complications: Secondary | ICD-10-CM | POA: Diagnosis present

## 2019-08-17 LAB — COMPREHENSIVE METABOLIC PANEL
ALT: 20 U/L (ref 0–44)
AST: 24 U/L (ref 15–41)
Albumin: 2.8 g/dL — ABNORMAL LOW (ref 3.5–5.0)
Alkaline Phosphatase: 106 U/L (ref 38–126)
Anion gap: 14 (ref 5–15)
BUN: 12 mg/dL (ref 8–23)
CO2: 30 mmol/L (ref 22–32)
Calcium: 9.4 mg/dL (ref 8.9–10.3)
Chloride: 102 mmol/L (ref 98–111)
Creatinine, Ser: 0.66 mg/dL (ref 0.44–1.00)
GFR calc Af Amer: >60 mL/min (ref >60)
GFR calc non Af Amer: >60 mL/min (ref >60)
Glucose, Bld: 186 mg/dL — ABNORMAL HIGH (ref 70–99)
Potassium: 4.5 mmol/L (ref 3.5–5.1)
Sodium: 146 mmol/L — ABNORMAL HIGH (ref 135–145)
Total Bilirubin: 0.9 mg/dL (ref 0.3–1.2)
Total Protein: 6.5 g/dL (ref 6.5–8.1)

## 2019-08-17 LAB — URINALYSIS, ROUTINE W REFLEX MICROSCOPIC
Bacteria, UA: NONE SEEN
Bilirubin Urine: NEGATIVE
Glucose, UA: NEGATIVE mg/dL
Ketones, ur: NEGATIVE mg/dL
Leukocytes,Ua: NEGATIVE
Nitrite: NEGATIVE
Protein, ur: NEGATIVE mg/dL
Specific Gravity, Urine: 1.014 (ref 1.005–1.030)
pH: 7 (ref 5.0–8.0)

## 2019-08-17 LAB — CBC WITH DIFFERENTIAL/PLATELET
Abs Immature Granulocytes: 0.04 10*3/uL (ref 0.00–0.07)
Basophils Absolute: 0.1 10*3/uL (ref 0.0–0.1)
Basophils Relative: 1 %
Eosinophils Absolute: 0.3 10*3/uL (ref 0.0–0.5)
Eosinophils Relative: 2 %
HCT: 43.1 % (ref 36.0–46.0)
Hemoglobin: 13.3 g/dL (ref 12.0–15.0)
Immature Granulocytes: 0 %
Lymphocytes Relative: 19 %
Lymphs Abs: 2.2 10*3/uL (ref 0.7–4.0)
MCH: 31.9 pg (ref 26.0–34.0)
MCHC: 30.9 g/dL (ref 30.0–36.0)
MCV: 103.4 fL — ABNORMAL HIGH (ref 80.0–100.0)
Monocytes Absolute: 1.5 10*3/uL — ABNORMAL HIGH (ref 0.1–1.0)
Monocytes Relative: 13 %
Neutro Abs: 7.4 10*3/uL (ref 1.7–7.7)
Neutrophils Relative %: 65 %
Platelets: 353 10*3/uL (ref 150–400)
RBC: 4.17 MIL/uL (ref 3.87–5.11)
RDW: 15.4 % (ref 11.5–15.5)
WBC: 11.6 10*3/uL — ABNORMAL HIGH (ref 4.0–10.5)
nRBC: 0 % (ref 0.0–0.2)

## 2019-08-17 LAB — GLUCOSE, CAPILLARY: Glucose-Capillary: 138 mg/dL — ABNORMAL HIGH (ref 70–99)

## 2019-08-17 LAB — SARS CORONAVIRUS 2 BY RT PCR (HOSPITAL ORDER, PERFORMED IN ~~LOC~~ HOSPITAL LAB): SARS Coronavirus 2: NEGATIVE

## 2019-08-17 LAB — AMMONIA: Ammonia: 124 umol/L — ABNORMAL HIGH (ref 9–35)

## 2019-08-17 LAB — BRAIN NATRIURETIC PEPTIDE: B Natriuretic Peptide: 540 pg/mL — ABNORMAL HIGH (ref 0.0–100.0)

## 2019-08-17 MED ORDER — INSULIN ASPART 100 UNIT/ML ~~LOC~~ SOLN
0.0000 [IU] | Freq: Three times a day (TID) | SUBCUTANEOUS | Status: DC
  Administered 2019-08-18 – 2019-08-19 (×2): 2 [IU] via SUBCUTANEOUS
  Administered 2019-08-19: 5 [IU] via SUBCUTANEOUS
  Administered 2019-08-20: 3 [IU] via SUBCUTANEOUS
  Administered 2019-08-20: 2 [IU] via SUBCUTANEOUS

## 2019-08-17 MED ORDER — LACTULOSE 10 GM/15ML PO SOLN
30.0000 g | Freq: Three times a day (TID) | ORAL | Status: DC
  Administered 2019-08-17 – 2019-08-20 (×8): 30 g via ORAL
  Filled 2019-08-17 (×8): qty 60

## 2019-08-17 MED ORDER — FUROSEMIDE 20 MG PO TABS
20.0000 mg | ORAL_TABLET | Freq: Every day | ORAL | Status: DC
  Administered 2019-08-18 – 2019-08-20 (×3): 20 mg via ORAL
  Filled 2019-08-17 (×3): qty 1

## 2019-08-17 MED ORDER — MIRTAZAPINE 15 MG PO TABS: 15.0000 mg | ORAL_TABLET | Freq: Every day | ORAL | Status: DC

## 2019-08-17 MED ORDER — INSULIN ASPART 100 UNIT/ML ~~LOC~~ SOLN
4.0000 [IU] | Freq: Three times a day (TID) | SUBCUTANEOUS | Status: DC
  Administered 2019-08-18: 4 [IU] via SUBCUTANEOUS

## 2019-08-17 MED ORDER — MOMETASONE FURO-FORMOTEROL FUM 100-5 MCG/ACT IN AERO
2.0000 | INHALATION_SPRAY | Freq: Two times a day (BID) | RESPIRATORY_TRACT | Status: DC
  Administered 2019-08-18 – 2019-08-20 (×4): 2 via RESPIRATORY_TRACT
  Filled 2019-08-17: qty 8.8

## 2019-08-17 MED ORDER — METOPROLOL SUCCINATE ER 50 MG PO TB24
50.0000 mg | ORAL_TABLET | Freq: Every day | ORAL | Status: DC
  Administered 2019-08-18 – 2019-08-20 (×3): 50 mg via ORAL
  Filled 2019-08-17 (×3): qty 1

## 2019-08-17 MED ORDER — ASPIRIN EC 81 MG PO TBEC
81.0000 mg | DELAYED_RELEASE_TABLET | Freq: Every day | ORAL | Status: DC
  Administered 2019-08-18 – 2019-08-20 (×3): 81 mg via ORAL
  Filled 2019-08-17 (×3): qty 1

## 2019-08-17 MED ORDER — DILTIAZEM HCL ER COATED BEADS 240 MG PO CP24
240.0000 mg | ORAL_CAPSULE | Freq: Every day | ORAL | Status: DC
  Administered 2019-08-18 – 2019-08-20 (×3): 240 mg via ORAL
  Filled 2019-08-17 (×3): qty 1

## 2019-08-17 MED ORDER — ENOXAPARIN SODIUM 40 MG/0.4ML ~~LOC~~ SOLN
40.0000 mg | Freq: Every day | SUBCUTANEOUS | Status: DC
  Administered 2019-08-17 – 2019-08-19 (×3): 40 mg via SUBCUTANEOUS
  Filled 2019-08-17 (×3): qty 0.4

## 2019-08-17 MED ORDER — SODIUM POLYSTYRENE SULFONATE 15 GM/60ML PO SUSP
30.0000 g | Freq: Once | ORAL | Status: DC
  Filled 2019-08-17: qty 120

## 2019-08-17 MED ORDER — INSULIN GLARGINE 100 UNIT/ML ~~LOC~~ SOLN
10.0000 [IU] | Freq: Every day | SUBCUTANEOUS | Status: DC
  Administered 2019-08-18 – 2019-08-20 (×3): 10 [IU] via SUBCUTANEOUS
  Filled 2019-08-17 (×5): qty 0.1

## 2019-08-17 MED ORDER — MECLIZINE HCL 12.5 MG PO TABS: 25.0000 mg | ORAL_TABLET | Freq: Three times a day (TID) | ORAL | Status: DC | PRN

## 2019-08-17 MED ORDER — ROSUVASTATIN CALCIUM 10 MG PO TABS
5.0000 mg | ORAL_TABLET | Freq: Every day | ORAL | Status: DC
  Administered 2019-08-18 – 2019-08-19 (×2): 5 mg via ORAL
  Filled 2019-08-17 (×3): qty 1

## 2019-08-17 MED ORDER — GABAPENTIN 300 MG PO CAPS
300.0000 mg | ORAL_CAPSULE | Freq: Two times a day (BID) | ORAL | Status: DC
  Administered 2019-08-17 – 2019-08-18 (×2): 300 mg via ORAL
  Filled 2019-08-17 (×2): qty 1

## 2019-08-17 MED ORDER — FLUTICASONE PROPIONATE 50 MCG/ACT NA SUSP
1.0000 | Freq: Every day | NASAL | Status: DC
  Administered 2019-08-18 – 2019-08-20 (×3): 1 via NASAL
  Filled 2019-08-17: qty 16

## 2019-08-17 MED ORDER — INSULIN ASPART 100 UNIT/ML ~~LOC~~ SOLN
0.0000 [IU] | Freq: Every day | SUBCUTANEOUS | Status: DC
  Administered 2019-08-18: 3 [IU] via SUBCUTANEOUS

## 2019-08-17 MED ORDER — IPRATROPIUM-ALBUTEROL 0.5-2.5 (3) MG/3ML IN SOLN
3.0000 mL | Freq: Two times a day (BID) | RESPIRATORY_TRACT | Status: DC
  Administered 2019-08-18 – 2019-08-20 (×4): 3 mL via RESPIRATORY_TRACT
  Filled 2019-08-17 (×5): qty 3

## 2019-08-17 MED ORDER — FAMOTIDINE 20 MG PO TABS
20.0000 mg | ORAL_TABLET | Freq: Two times a day (BID) | ORAL | Status: DC
  Administered 2019-08-17 – 2019-08-20 (×6): 20 mg via ORAL
  Filled 2019-08-17 (×6): qty 1

## 2019-08-17 MED ORDER — LACTULOSE ENEMA
300.0000 mL | Freq: Once | ORAL | Status: AC
  Administered 2019-08-17: 300 mL via RECTAL
  Filled 2019-08-17: qty 300

## 2019-08-17 NOTE — ED Provider Notes (Signed)
[facility] EMERGENCY DEPARTMENT Provider Note   CSN: [id number] Arrival date & time: 08/17/19  1447     History Chief Complaint  Patient presents with  . Altered Mental Status    Heather Newman is a 79 y.o. female.  Patient with a history of dementia and cirrhosis.  She became more confused at the nursing home was sent over here  The history is provided by a relative. No language interpreter was used.  Altered Mental Status Presenting symptoms: behavior changes   Severity:  Moderate Most recent episode:  Today Episode history:  Continuous Timing:  Constant Progression:  Worsening Chronicity:  Recurrent Context: dementia   Context: not alcohol use   Associated symptoms: no abdominal pain        Past Medical History:  Diagnosis Date  . Alzheimer's disease ([place])   . 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 Bilateral primary osteoarthritis of knee   . CAD (coronary artery disease)   . Calculus of ureter   . CHF (congestive heart failure) ([place])   . COPD (chronic obstructive pulmonary disease) ([place])   . Depression   . Displaced spiral fracture of shaft of left femur, subsequent encounter for closed fracture with routine healing   . Essential hypertension   . GERD (gastroesophageal reflux disease)   . Hepatic cirrhosis ([place])   . Hyperlipidemia   . LBBB (left bundle branch block)   . Melanocytic nevi, unspecified   . OSA (obstructive sleep apnea)   . PAF (paroxysmal atrial fibrillation) ([place])   . Type 2 diabetes mellitus ([place])   . Vitamin D deficiency     Patient Active Problem List   Diagnosis Date Noted  . Rectal bleeding 08/04/2019  . Hyperglycemia due to diabetes mellitus ([place]) 08/04/2019  . Atrial fibrillation with RVR ([place]) 08/04/2019  . SIRS (systemic inflammatory response syndrome) ([place]) 08/04/2019  . Sepsis ([place]) 08/04/2019  . Fever 07/16/2019  . Acute on chronic respiratory failure with  hypoxia ([place]) 07/15/2019  . Hypophosphatemia   . CAD (coronary artery disease)   . Essential hypertension   . Atrial flutter with rapid ventricular response ([place])   . History of cardiomyopathy   . Pressure injury of skin 05/13/2019  . Acute respiratory failure with hypoxia and hypercapnia ([place]) 05/12/2019  . COPD exacerbation ([place]) 05/12/2019  . Pressure injury of skin of left buttock 04/03/2019  . Pressure injury of heel, stage 2 ([place]) 04/03/2019  . Diabetes mellitus type 2 with complications ([place]) 99991111  . Nail overgrowth 03/29/2019  . Pressure injury of left heel, stage 2 ([place]) 03/29/2019  . Obstructive sleep apnea (adult) (pediatric)   . Altered mental status, unspecified   . Atherosclerosis of native arteries of right leg with ulceration of other part of foot ([place])   . Atrial premature depolarization   . Calculus of ureter   . Chronic systolic (congestive) heart failure ([place])   . Cognitive communication deficit   . Dementia in other diseases classified elsewhere without behavioral disturbance ([place])   . Emphysema, unspecified ([place])   . Immobility syndrome (paraplegic)   . Left bundle-branch block, unspecified   . Major depressive disorder, single episode, unspecified   . Acute metabolic encephalopathy   . Other disorders of peripheral nervous system   . Other hypersomnia   . Other symptoms and signs concerning food and fluid intake   . Pain, unspecified   . Sleep disorder   . Stereotyped movement  disorders   . PAF (paroxysmal atrial fibrillation) ([place])   . Alzheimer's disease, unspecified (CODE) ([place])   . Coronary artery disease of native artery of native heart with stable angina pectoris ([place])   . Bilateral primary osteoarthritis of knee   . Circadian rhythm sleep disorder, advanced sleep phase type   . Constipation, unspecified   . Difficulty in walking, not elsewhere classified   . Insomnia due to medical condition   . Erythematous condition, unspecified   .  Localized edema   . Melanocytic nevi, unspecified   . Other cirrhosis of liver ([place])   . Other fatigue   . Snoring   . Other symptoms and signs involving the musculoskeletal system   . Vitamin D deficiency, unspecified     Past Surgical History:  Procedure Laterality Date  . CHOLECYSTECTOMY    . SPINE SURGERY    . STRABISMUS SURGERY       OB History   No obstetric history on file.     Family History  Problem Relation Age of Onset  . Diabetes Mellitus II Mother   . Pneumonia Father     Social History   Tobacco Use  . Smoking status: Current Every Day Smoker    Types: Cigarettes  . Smokeless tobacco: Never Used  Substance Use Topics  . Alcohol use: Never  . Drug use: Never    Home Medications Prior to Admission medications   Medication Sig Start Date End Date Taking? Authorizing Provider  acetaminophen (TYLENOL) 325 MG tablet Take 325-650 mg by mouth every 6 (six) hours as needed for mild pain or moderate pain.    [provider]  albuterol (PROVENTIL) (2.5 MG/3ML) 0.083% nebulizer solution Take 3 mLs (2.5 mg total) by nebulization every 6 (six) hours as needed for wheezing or shortness of breath. 03/28/19   [name], NP  aspirin EC 81 MG tablet Take 1 tablet (81 mg total) by mouth daily with breakfast. Hold aspirin until 08/10/2019 08/13/19   Roxan Hockey, MD  diltiazem (CARDIZEM CD) 240 MG 24 hr capsule Take 1 capsule (240 mg total) by mouth daily. For Heart 05/17/19   Roxan Hockey, MD  famotidine (PEPCID) 20 MG tablet Take 1 tablet (20 mg total) by mouth 2 (two) times daily. 07/17/19   Barton Dubois, MD  fluticasone (FLONASE) 50 MCG/ACT nasal spray Place 1 spray into both nostrils daily.    [provider]  Fluticasone-Salmeterol (ADVAIR) 100-50 MCG/DOSE AEPB Inhale 1 puff into the lungs 2 (two) times daily.    [provider]  furosemide (LASIX) 20 MG tablet Take 1 tablet (20 mg total) by mouth daily. 05/16/19   Roxan Hockey,  MD  gabapentin (NEURONTIN) 300 MG capsule Take 1 capsule (300 mg total) by mouth 2 (two) times daily. 05/16/19   Roxan Hockey, MD  guaiFENesin (MUCINEX) 600 MG 12 hr tablet Take 1 tablet (600 mg total) by mouth 2 (two) times daily. 07/17/19   Barton Dubois, MD  hydrocortisone (ANUSOL-HC) 2.5 % rectal cream Place rectally 4 (four) times daily. Use for 3 to 4 weeks then okay to stop 08/06/19   Roxan Hockey, MD  insulin aspart (NOVOLOG) 100 UNIT/ML FlexPen Inject 0-6 Units into the skin See admin instructions. insulin aspart (novoLOG) injection 0- 6 Units 0- 6 Units Subcutaneous, 3 times daily with meals CBG < 70: Implement Hypoglycemia Standing Orders, -- CBG 70 - 120: 0 unit CBG 121 - 150: 0 unit  CBG 151 - 200: 0 unit CBG 201 -  250: 1 units CBG 251 - 300: 2 units CBG 301 - 350: 4 units  CBG 351 - 400: 6 units  CBG > 400: 6 units 08/06/19   Emokpae, Courage, MD  Insulin Glargine (BASAGLAR KWIKPEN) 100 UNIT/ML Inject 10 Units into the skin daily.    [provider]  Insulin Lispro (ADMELOG SOLOSTAR [place]) Inject 4 Units into the skin 3 (three) times daily before meals.     [provider]  ipratropium-albuterol (DUONEB) 0.5-2.5 (3) MG/3ML SOLN Take 3 mLs by nebulization 2 (two) times daily. 03/29/19   Fayrene Helper, MD  lactulose (CHRONULAC) 10 GM/15ML solution Take 30 mLs (20 g total) by mouth 3 (three) times daily. Goal is to have 2-3 mushy stools per day-avoid constipation and avoid frank watery diarrhea type stool 08/06/19   Roxan Hockey, MD  meclizine (ANTIVERT) 25 MG tablet Take 25 mg by mouth 3 (three) times daily as needed for dizziness.    [provider]  metFORMIN (GLUCOPHAGE) 500 MG tablet Take 1 tablet (500 mg total) by mouth 2 (two) times daily with a meal. 05/16/19   Emokpae, Courage, MD  metoprolol succinate (TOPROL XL) 50 MG 24 hr tablet Take 1 tablet (50 mg total) by mouth daily. 05/16/19 05/15/20  Roxan Hockey, MD  mirtazapine (REMERON) 15 MG tablet  Take 1 tablet (15 mg total) by mouth at bedtime. 05/01/19   [name], NP  nystatin-triamcinolone (MYCOLOG II) cream Apply 1 application topically 2 (two) times daily. Apply to buttocks--rash 08/06/19   Roxan Hockey, MD  rosuvastatin (CRESTOR) 5 MG tablet Take 5 mg by mouth daily.    [provider]  Vitamin D, Ergocalciferol, (DRISDOL) 1.25 MG (50000 UNIT) CAPS capsule Take 1 capsule (50,000 Units total) by mouth every 7 (seven) days. Patient taking differently: Take 50,000 Units by mouth every Monday.  04/09/19   Fayrene Helper, MD    Allergies    Patient has no known allergies.  Review of Systems   Review of Systems  Unable to perform ROS: Dementia  Gastrointestinal: Negative for abdominal pain.    Physical Exam Updated Vital Signs BP (!) 148/114   Pulse 88   Temp 99.8 F (37.7 C) (Rectal)   Resp (!) 23   SpO2 98%   Physical Exam Vitals and nursing note reviewed.  Constitutional:      Appearance: She is well-developed.  HENT:     Head: Normocephalic.     Nose: Nose normal.  Eyes:     General: No scleral icterus.    Conjunctiva/sclera: Conjunctivae normal.  Neck:     Thyroid: No thyromegaly.  Cardiovascular:     Rate and Rhythm: Normal rate and regular rhythm.     Heart sounds: No murmur. No friction rub. No gallop.   Pulmonary:     Breath sounds: No stridor. No wheezing or rales.  Chest:     Chest wall: No tenderness.  Abdominal:     General: There is no distension.     Tenderness: There is no abdominal tenderness. There is no rebound.  Musculoskeletal:        General: Normal range of motion.     Cervical back: Neck supple.  Lymphadenopathy:     Cervical: No cervical adenopathy.  Skin:    Findings: No erythema or rash.  Neurological:     Mental Status: She is alert.     Motor: No abnormal muscle tone.     Coordination: Coordination normal.  Comments: Patient alert but will not answer questions  Psychiatric:        Behavior:  Behavior normal.     ED Results / Procedures / Treatments   Labs (all labs ordered are listed, but only abnormal results are displayed) Labs Reviewed  CBC WITH DIFFERENTIAL/PLATELET - Abnormal; Notable for the following components:      Result Value   WBC 11.6 (*)    MCV 103.4 (*)    Monocytes Absolute 1.5 (*)    All other components within normal limits  COMPREHENSIVE METABOLIC PANEL - Abnormal; Notable for the following components:   Sodium 146 (*)    Glucose, Bld 186 (*)    Albumin 2.8 (*)    All other components within normal limits  AMMONIA - Abnormal; Notable for the following components:   Ammonia 124 (*)    All other components within normal limits  URINALYSIS, ROUTINE W REFLEX MICROSCOPIC - Abnormal; Notable for the following components:   Hgb urine dipstick MODERATE (*)    All other components within normal limits  BRAIN NATRIURETIC PEPTIDE - Abnormal; Notable for the following components:   B Natriuretic Peptide 540.0 (*)    All other components within normal limits  SARS CORONAVIRUS 2 BY RT PCR (HOSPITAL ORDER, [facility])    EKG None  Radiology DG Chest Port 1 View  Result Date: 08/17/2019 CLINICAL DATA:  Weakness and elevated ammonia level EXAM: PORTABLE CHEST 1 VIEW COMPARISON:  08/04/2019 FINDINGS: Cardiac shadow is enlarged but stable. Aortic calcifications are again seen. The lungs are well aerated bilaterally. Mild vascular congestion is again noted centrally. No edema is seen. No focal infiltrate is noted. Mild scarring at the left base is noted. IMPRESSION: Chronic changes without acute abnormality. Electronically Signed   By: Inez Catalina M.D.   On: 08/17/2019 15:39    Procedures Procedures (including critical care time)  Medications Ordered in ED Medications  lactulose (CHRONULAC) enema 200 gm (has no administration in time range)    ED Course  I have reviewed the triage vital signs and the nursing notes.  Pertinent  labs & imaging results that were available during my care of the patient were reviewed by me and considered in my medical decision making (see chart for details).    CRITICAL CARE Performed by: Milton Ferguson Total critical care time: 35 minutes Critical care time was exclusive of separately billable procedures and treating other patients. Critical care was necessary to treat or prevent imminent or life-threatening deterioration. Critical care was time spent personally by me on the following activities: development of treatment plan with patient and/or surrogate as well as nursing, discussions with consultants, evaluation of patient's response to treatment, examination of patient, obtaining history from patient or surrogate, ordering and performing treatments and interventions, ordering and review of laboratory studies, ordering and review of radiographic studies, pulse oximetry and re-evaluation of patient's condition.  MDM Rules/Calculators/A&P                      Patient is ammonia level 125.  She will be admitted for hepatic encephalopathy     This patient presents to the ED for concern of hepatic encephalopathy, this involves an extensive number of treatment options, and is a complaint that carries with it a high risk of complications and morbidity.  The differential diagnosis includes altered mental status could be hepatic encephalopathy dementia CVA   Lab Tests:   I Ordered, reviewed, and interpreted  labs, which included CBC chemistries and ammonia level.  Ammonia level is extremely high at 125 and patient has mild elevation white count and mild elevation of sodium  Medicines ordered:   I ordered medication lactulose for elevated ammonia and hepatic cephalopathy  Imaging Studies ordered:   I ordered imaging studies which included chest x-ray and  I independently visualized and interpreted imaging which showed no acute disease  Additional history obtained:   Additional  history obtained from family  Previous records obtained and reviewed   Consultations Obtained:   I consulted hospitalist and discussed lab and imaging findings  Reevaluation:  After the interventions stated above, I reevaluated the patient and found no significant change  Critical Interventions:  .   Final Clinical Impression(s) / ED Diagnoses Final diagnoses:  Hepatic encephalopathy [facility])    Rx / Tome Orders ED Discharge Orders    None       Milton Ferguson, MD 08/17/19 1744

## 2019-08-17 NOTE — H&P (Signed)
History and Physical  Heather Newman [id number] DOB: 08-Jan-1941 DOA: 08/17/2019  Referring physician: Dr Roderic Palau, ED physician PCP: Patient, No Pcp Per  Outpatient Specialists:   Patient Coming From: [facility]  Chief Complaint: AMS  HPI: Heather Newman is a 79 y.o. female with a history of dementia, coronary artery disease, COPD, GERD, hypertension, hepatic cirrhosis, atrial fibrillation.  Patient seen for increasing altered mental status.  Patient is a resident of [facility]..  Patient is oriented x4 at baseline, but now oriented to person.  She is also had diminished oral food and liquid intake.  She was brought to the hospital for evaluation, especially given her history of hepatic cirrhosis.  I spoke with the patient's granddaughter [name], who is the patient's closest living relative.  Patient does not have an official POA.  She also does not have a GI doctor, as she recently moved down to [place] from [place] getting of the year.  Emergency Department Course: Ammonia level 124.  All other labs fairly normal.  UA normal  Review of Systems:   Past Medical History:  Diagnosis Date  . Alzheimer's disease ([place])   . Atherosclerosis of native arteries of right leg with ulceration of other part of foot ([place])   . Bilateral primary osteoarthritis of knee   . CAD (coronary artery disease)   . Calculus of ureter   . CHF (congestive heart failure) ([place])   . COPD (chronic obstructive pulmonary disease) ([place])   . Depression   . Displaced spiral fracture of shaft of left femur, subsequent encounter for closed fracture with routine healing   . Essential hypertension   . GERD (gastroesophageal reflux disease)   . Hepatic cirrhosis ([place])   . Hyperlipidemia   . LBBB (left bundle branch block)   . Melanocytic nevi, unspecified   . OSA (obstructive sleep apnea)   . PAF (paroxysmal atrial fibrillation) ([place])   . Type 2 diabetes mellitus ([place])   .  Vitamin D deficiency    Past Surgical History:  Procedure Laterality Date  . CHOLECYSTECTOMY    . SPINE SURGERY    . STRABISMUS SURGERY     Social History:  reports that she has been smoking cigarettes. She has never used smokeless tobacco. She reports that she does not drink alcohol or use drugs. Patient lives at [facility]  No Known Allergies  Family History  Problem Relation Age of Onset  . Diabetes Mellitus II Mother   . Pneumonia Father       Prior to Admission medications   Medication Sig Start Date End Date Taking? Authorizing Provider  acetaminophen (TYLENOL) 325 MG tablet Take 325-650 mg by mouth every 6 (six) hours as needed for mild pain or moderate pain.    [provider]  albuterol (PROVENTIL) (2.5 MG/3ML) 0.083% nebulizer solution Take 3 mLs (2.5 mg total) by nebulization every 6 (six) hours as needed for wheezing or shortness of breath. 03/28/19   [name], NP  aspirin EC 81 MG tablet Take 1 tablet (81 mg total) by mouth daily with breakfast. Hold aspirin until 08/10/2019 08/13/19   Roxan Hockey, MD  diltiazem (CARDIZEM CD) 240 MG 24 hr capsule Take 1 capsule (240 mg total) by mouth daily. For Heart 05/17/19   Roxan Hockey, MD  famotidine (PEPCID) 20 MG tablet Take 1 tablet (20 mg total) by mouth 2 (two) times daily. 07/17/19   Barton Dubois, MD  fluticasone (FLONASE) 50 MCG/ACT nasal spray Place 1  spray into both nostrils daily.    [provider]  Fluticasone-Salmeterol (ADVAIR) 100-50 MCG/DOSE AEPB Inhale 1 puff into the lungs 2 (two) times daily.    [provider]  furosemide (LASIX) 20 MG tablet Take 1 tablet (20 mg total) by mouth daily. 05/16/19   Roxan Hockey, MD  gabapentin (NEURONTIN) 300 MG capsule Take 1 capsule (300 mg total) by mouth 2 (two) times daily. 05/16/19   Roxan Hockey, MD  guaiFENesin (MUCINEX) 600 MG 12 hr tablet Take 1 tablet (600 mg total) by mouth 2 (two) times daily. 07/17/19   Barton Dubois, MD  hydrocortisone (ANUSOL-HC) 2.5 % rectal cream Place rectally 4 (four) times daily. Use for 3 to 4 weeks then okay to stop 08/06/19   Roxan Hockey, MD  insulin aspart (NOVOLOG) 100 UNIT/ML FlexPen Inject 0-6 Units into the skin See admin instructions. insulin aspart (novoLOG) injection 0- 6 Units 0- 6 Units Subcutaneous, 3 times daily with meals CBG < 70: Implement Hypoglycemia Standing Orders, -- CBG 70 - 120: 0 unit CBG 121 - 150: 0 unit  CBG 151 - 200: 0 unit CBG 201 - 250: 1 units CBG 251 - 300: 2 units CBG 301 - 350: 4 units  CBG 351 - 400: 6 units  CBG > 400: 6 units 08/06/19   Emokpae, Courage, MD  Insulin Glargine (BASAGLAR KWIKPEN) 100 UNIT/ML Inject 10 Units into the skin daily.    [provider]  Insulin Lispro (ADMELOG SOLOSTAR [place]) Inject 4 Units into the skin 3 (three) times daily before meals.     [provider]  ipratropium-albuterol (DUONEB) 0.5-2.5 (3) MG/3ML SOLN Take 3 mLs by nebulization 2 (two) times daily. 03/29/19   Fayrene Helper, MD  lactulose (CHRONULAC) 10 GM/15ML solution Take 30 mLs (20 g total) by mouth 3 (three) times daily. Goal is to have 2-3 mushy stools per day-avoid constipation and avoid frank watery diarrhea type stool 08/06/19   Roxan Hockey, MD  meclizine (ANTIVERT) 25 MG tablet Take 25 mg by mouth 3 (three) times daily as needed for dizziness.    [provider]  metFORMIN (GLUCOPHAGE) 500 MG tablet Take 1 tablet (500 mg total) by mouth 2 (two) times daily with a meal. 05/16/19   Emokpae, Courage, MD  metoprolol succinate (TOPROL XL) 50 MG 24 hr tablet Take 1 tablet (50 mg total) by mouth daily. 05/16/19 05/15/20  Roxan Hockey, MD  mirtazapine (REMERON) 15 MG tablet Take 1 tablet (15 mg total) by mouth at bedtime. 05/01/19   [name], NP  nystatin-triamcinolone (MYCOLOG II) cream Apply 1 application topically 2 (two) times daily. Apply to buttocks--rash 08/06/19   Roxan Hockey, MD  rosuvastatin (CRESTOR) 5  MG tablet Take 5 mg by mouth daily.    [provider]  Vitamin D, Ergocalciferol, (DRISDOL) 1.25 MG (50000 UNIT) CAPS capsule Take 1 capsule (50,000 Units total) by mouth every 7 (seven) days. Patient taking differently: Take 50,000 Units by mouth every Monday.  04/09/19   Fayrene Helper, MD    Physical Exam: BP (!) 148/114   Pulse 88   Temp 99.8 F (37.7 C) (Rectal)   Resp (!) 23   SpO2 98%   . General: Elderly female. Awake and alert and oriented x1. No acute cardiopulmonary distress.  Marland Kitchen HEENT: Normocephalic atraumatic.  Right and left ears normal in appearance.  Pupils equal, round, reactive to light. Extraocular muscles are intact. Sclerae anicteric and noninjected.  Moist mucosal membranes. No mucosal  lesions.  . Neck: Neck supple without lymphadenopathy. No carotid bruits. No masses palpated.  . Cardiovascular: Regular rate with normal S1-S2 sounds. No murmurs, rubs, gallops auscultated. No JVD.  Marland Kitchen Respiratory: Good respiratory effort with no wheezes, rales, rhonchi. Lungs clear to auscultation bilaterally.  No accessory muscle use. . Abdomen: Soft, nontender, nondistended. Active bowel sounds. No masses or hepatosplenomegaly  . Skin: No rashes, lesions, or ulcerations.  Dry, warm to touch. 2+ dorsalis pedis and radial pulses. . Musculoskeletal: No calf or leg pain. All major joints not erythematous nontender.  No upper or lower joint deformation.  Good ROM.  No contractures  . Psychiatric: Judgment and insight not intact. . Neurologic: Unable to perform          Labs on Admission: I have personally reviewed following labs and imaging studies  CBC: Recent Labs  Lab 08/17/19 1528  WBC 11.6*  NEUTROABS 7.4  HGB 13.3  HCT 43.1  MCV 103.4*  PLT 0000000   Basic Metabolic Panel: Recent Labs  Lab 08/17/19 1528  NA 146*  K 4.5  CL 102  CO2 30  GLUCOSE 186*  BUN 12  CREATININE 0.66  CALCIUM 9.4   GFR: Estimated Creatinine Clearance: 66.8 mL/min (by C-G  formula based on SCr of 0.66 mg/dL). Liver Function Tests: Recent Labs  Lab 08/17/19 1528  AST 24  ALT 20  ALKPHOS 106  BILITOT 0.9  PROT 6.5  ALBUMIN 2.8*   No results for input(s): LIPASE, AMYLASE in the last 168 hours. Recent Labs  Lab 08/17/19 1528  AMMONIA 124*   Coagulation Profile: No results for input(s): INR, PROTIME in the last 168 hours. Cardiac Enzymes: No results for input(s): CKTOTAL, CKMB, CKMBINDEX, TROPONINI in the last 168 hours. BNP (last 3 results) No results for input(s): PROBNP in the last 8760 hours. HbA1C: No results for input(s): HGBA1C in the last 72 hours. CBG: No results for input(s): GLUCAP in the last 168 hours. Lipid Profile: No results for input(s): CHOL, HDL, LDLCALC, TRIG, CHOLHDL, LDLDIRECT in the last 72 hours. Thyroid Function Tests: No results for input(s): TSH, T4TOTAL, FREET4, T3FREE, THYROIDAB in the last 72 hours. Anemia Panel: No results for input(s): VITAMINB12, FOLATE, FERRITIN, TIBC, IRON, RETICCTPCT in the last 72 hours. Urine analysis:    Component Value Date/Time   COLORURINE YELLOW 08/17/2019 [place] 08/17/2019 1525   LABSPEC 1.014 08/17/2019 1525   PHURINE 7.0 08/17/2019 1525   GLUCOSEU NEGATIVE 08/17/2019 1525   HGBUR MODERATE (A) 08/17/2019 1525   BILIRUBINUR NEGATIVE 08/17/2019 1525   KETONESUR NEGATIVE 08/17/2019 1525   PROTEINUR NEGATIVE 08/17/2019 1525   NITRITE NEGATIVE 08/17/2019 [place] 08/17/2019 1525   Sepsis Labs: @LABRCNTIP (procalcitonin:4,lacticidven:4) )No results found for this or any previous visit (from the past 240 hour(s)).   Radiological Exams on Admission: DG Chest Port 1 View  Result Date: 08/17/2019 CLINICAL DATA:  Weakness and elevated ammonia level EXAM: PORTABLE CHEST 1 VIEW COMPARISON:  08/04/2019 FINDINGS: Cardiac shadow is enlarged but stable. Aortic calcifications are again seen. The lungs are well aerated bilaterally. Mild vascular  congestion is again noted centrally. No edema is seen. No focal infiltrate is noted. Mild scarring at the left base is noted. IMPRESSION: Chronic changes without acute abnormality. Electronically Signed   By: Inez Catalina M.D.   On: 08/17/2019 15:39    EKG: Independently reviewed.  A. fib with left bundle branch block. no acute ST changes  Assessment/Plan: Principal Problem:   Hepatic  encephalopathy ([place]) Active Problems:   PAF (paroxysmal atrial fibrillation) ([facility])   Alzheimer's disease, unspecified (CODE) ([place])   Diabetes mellitus type 2 with complications ([place])   History of cardiomyopathy   CAD (coronary artery disease)   Essential hypertension    This patient was discussed with the ED physician, including pertinent vitals, physical exam findings, labs, and imaging.  We also discussed care given by the ED provider.  1. Hepatic encephalopathy a. Admit to telemetry b. Lactulose enema c. Continue oral lactulose d. Recheck ammonia level in the morning e. We will check right upper quadrant ultrasound f. Consult GI as patient does not currently have a GI doctor 2. PAF a. Currently rate controlled b. Continue Cardizem 3. Alzheimer's a. Consult incision care team 4. Diabetes type 2 a. Continue insulin 5. CAD with history of cardiomyopathy and hypertension a. Continue aspirin and antihypertensives  DVT prophylaxis: Lovenox Consultants: GI Code Status: Limited code: No intubation and CPR Family Communication: Patient's granddaughter Disposition Plan: Should be able to return back to [facility], Tanna Savoy, DO

## 2019-08-17 NOTE — ED Notes (Signed)
This RN spoke with the pts daughter and granddaughter on the phone who requests to be contacted prior to "invasive diagnostics" for approval, this RN let her know that this would be charted with her contact phone number for f/u, the pts daughter states, " I want to be contacted before they do any invasive procedures. Her ammonia can be extremely high and that needs to be checked" The daughter and granddaughter can be contacted at [phone]

## 2019-08-17 NOTE — ED Triage Notes (Signed)
Pt in from [facility] via [facility] for c/o altered mental status onset last night with known elevated ammonia level, baseline neuro A&O x4, alert to self today, reported to have decreased oral intake and decreased ADLs, CBG 241, #20 R AC

## 2019-08-17 NOTE — Progress Notes (Signed)
Came to give patient scheduled treatment and inhaler. Patient was asleep. Woke patient to ask if she wanted to take her medicine she stated she wanted to wait until the morning. Breath sounds were clear throughout and O2 sat was 96% on 2 lpm nasal cannula. Left setup and medicine hanging in respiratory bag in room for dayshift to start.

## 2019-08-18 ENCOUNTER — Inpatient Hospital Stay (HOSPITAL_COMMUNITY): Payer: Medicare Other

## 2019-08-18 ENCOUNTER — Other Ambulatory Visit: Payer: Self-pay

## 2019-08-18 DIAGNOSIS — K7290 Hepatic failure, unspecified without coma: Secondary | ICD-10-CM

## 2019-08-18 DIAGNOSIS — I2510 Atherosclerotic heart disease of native coronary artery without angina pectoris: Secondary | ICD-10-CM

## 2019-08-18 DIAGNOSIS — I10 Essential (primary) hypertension: Secondary | ICD-10-CM

## 2019-08-18 DIAGNOSIS — G309 Alzheimer's disease, unspecified: Secondary | ICD-10-CM

## 2019-08-18 DIAGNOSIS — E118 Type 2 diabetes mellitus with unspecified complications: Secondary | ICD-10-CM

## 2019-08-18 LAB — CBC
HCT: 43.4 % (ref 36.0–46.0)
Hemoglobin: 13.6 g/dL (ref 12.0–15.0)
MCH: 31.5 pg (ref 26.0–34.0)
MCHC: 31.3 g/dL (ref 30.0–36.0)
MCV: 100.5 fL — ABNORMAL HIGH (ref 80.0–100.0)
Platelets: 366 10*3/uL (ref 150–400)
RBC: 4.32 MIL/uL (ref 3.87–5.11)
RDW: 15 % (ref 11.5–15.5)
WBC: 17.2 10*3/uL — ABNORMAL HIGH (ref 4.0–10.5)
nRBC: 0 % (ref 0.0–0.2)

## 2019-08-18 LAB — BASIC METABOLIC PANEL
Anion gap: 10 (ref 5–15)
BUN: 13 mg/dL (ref 8–23)
CO2: 32 mmol/L (ref 22–32)
Calcium: 9.3 mg/dL (ref 8.9–10.3)
Chloride: 102 mmol/L (ref 98–111)
Creatinine, Ser: 0.51 mg/dL (ref 0.44–1.00)
GFR calc Af Amer: >60 mL/min (ref >60)
GFR calc non Af Amer: >60 mL/min (ref >60)
Glucose, Bld: 147 mg/dL — ABNORMAL HIGH (ref 70–99)
Potassium: 3.4 mmol/L — ABNORMAL LOW (ref 3.5–5.1)
Sodium: 144 mmol/L (ref 135–145)

## 2019-08-18 LAB — GLUCOSE, CAPILLARY
Glucose-Capillary: 150 mg/dL — ABNORMAL HIGH (ref 70–99)
Glucose-Capillary: 151 mg/dL — ABNORMAL HIGH (ref 70–99)
Glucose-Capillary: 105 mg/dL — ABNORMAL HIGH (ref 70–99)
Glucose-Capillary: 267 mg/dL — ABNORMAL HIGH (ref 70–99)

## 2019-08-18 LAB — PROTIME-INR
INR: 1.1 (ref 0.8–1.2)
Prothrombin Time: 13.5 seconds (ref 11.4–15.2)

## 2019-08-18 LAB — AMMONIA: Ammonia: 51 umol/L — ABNORMAL HIGH (ref 9–35)

## 2019-08-18 MED ORDER — GABAPENTIN 100 MG PO CAPS
200.0000 mg | ORAL_CAPSULE | Freq: Two times a day (BID) | ORAL | Status: DC
  Administered 2019-08-18 – 2019-08-20 (×4): 200 mg via ORAL
  Filled 2019-08-18 (×4): qty 2

## 2019-08-18 MED ORDER — MORPHINE SULFATE (PF) 2 MG/ML IV SOLN
2.0000 mg | Freq: Once | INTRAVENOUS | Status: AC
  Administered 2019-08-18: 2 mg via INTRAVENOUS
  Filled 2019-08-18: qty 1

## 2019-08-18 MED ORDER — NYSTATIN-TRIAMCINOLONE 100000-0.1 UNIT/GM-% EX OINT
TOPICAL_OINTMENT | Freq: Two times a day (BID) | CUTANEOUS | Status: DC
  Filled 2019-08-18: qty 15

## 2019-08-18 MED ORDER — RIFAXIMIN 550 MG PO TABS
550.0000 mg | ORAL_TABLET | Freq: Two times a day (BID) | ORAL | Status: DC
  Administered 2019-08-18 – 2019-08-20 (×5): 550 mg via ORAL
  Filled 2019-08-18 (×5): qty 1

## 2019-08-18 MED ORDER — MIRTAZAPINE 15 MG PO TABS
7.5000 mg | ORAL_TABLET | Freq: Every day | ORAL | Status: DC
  Administered 2019-08-18 – 2019-08-19 (×2): 7.5 mg via ORAL
  Filled 2019-08-18 (×2): qty 1

## 2019-08-18 MED ORDER — NYSTATIN 100000 UNIT/GM EX POWD
Freq: Three times a day (TID) | CUTANEOUS | Status: DC
  Filled 2019-08-18: qty 15

## 2019-08-18 NOTE — Consult Note (Signed)
Referring Provider: Roxan Hockey, MD Primary Care Physician:  Patient, No Pcp Per Primary Gastroenterologist:  Dr. Laural Golden  Reason for Consultation:   Hepatic encephalopathy.  HPI:   Patient is unable to provide detailed history.  History is obtained from patient's chart.  I did contact patient's granddaughter [name] but she did not answer the phone. Patient is 79 year old Caucasian female with multiple medical problems including COPD CHF atrial atrial flutter cirrhosis as well as dementia who was brought to emergency room yesterday afternoon with mental status changes.  Patient was noted to have elevated serum ammonia level.  She was felt to have hepatic encephalopathy.  She was given lactulose enema and begun on p.o. like lactulose.  Patient underwent abdominal ultrasound which shows nodular contour to the liver consistent with cirrhosis. Patient is aware that she has cirrhosis.  She is not sure how long ago the diagnosis was made.  She also complains of shortness of breath.  She denies nausea vomiting abdominal pain melena or rectal bleeding.  No history of fever or chills. Patient is cared for at [facility].  Review of patient's records revealed that she was hospitalized in April for acute on chronic respiratory failure with hypoxia.  She was treated and improved and was discharged on 07/17/2019.  She presented again 2 weeks ago with a rectal bleeding.  He was also treated for SIRS.  It was felt she may have bled from hemorrhoids.  Given comorbid conditions it was decided not to proceed with endoscopic work-up.    Past Medical History:  Diagnosis Date  . Alzheimer's disease ([place])   . Atherosclerosis of native arteries of right leg with ulceration of other part of foot ([place])   . Bilateral primary osteoarthritis of knee   . CAD (coronary artery disease)   . Calculus of ureter   . CHF (congestive heart failure) ([place])   . COPD (chronic obstructive pulmonary disease) ([place])   .  Depression   . Displaced spiral fracture of shaft of left femur, subsequent encounter for closed fracture with routine healing   . Essential hypertension   . GERD (gastroesophageal reflux disease)   . Hepatic cirrhosis ([place])   . Hyperlipidemia   . LBBB (left bundle branch block)   . Melanocytic nevi, unspecified   . OSA (obstructive sleep apnea)   . PAF (paroxysmal atrial fibrillation) ([place])   . Type 2 diabetes mellitus ([place])   . Vitamin D deficiency     Past Surgical History:  Procedure Laterality Date  . CHOLECYSTECTOMY    . SPINE SURGERY    . STRABISMUS SURGERY      Prior to Admission medications   Medication Sig Start Date End Date Taking? Authorizing Provider  acetaminophen (TYLENOL) 325 MG tablet Take 325-650 mg by mouth every 6 (six) hours as needed for mild pain or moderate pain.    [provider]  albuterol (PROVENTIL) (2.5 MG/3ML) 0.083% nebulizer solution Take 3 mLs (2.5 mg total) by nebulization every 6 (six) hours as needed for wheezing or shortness of breath. 03/28/19   [name], NP  aspirin EC 81 MG tablet Take 1 tablet (81 mg total) by mouth daily with breakfast. Hold aspirin until 08/10/2019 08/13/19   Roxan Hockey, MD  diltiazem (CARDIZEM CD) 240 MG 24 hr capsule Take 1 capsule (240 mg total) by mouth daily. For Heart 05/17/19   Roxan Hockey, MD  famotidine (PEPCID) 20 MG tablet Take 1 tablet (20 mg total) by mouth 2 (two) times daily.  07/17/19   Barton Dubois, MD  fluticasone (FLONASE) 50 MCG/ACT nasal spray Place 1 spray into both nostrils daily.    [provider]  Fluticasone-Salmeterol (ADVAIR) 100-50 MCG/DOSE AEPB Inhale 1 puff into the lungs 2 (two) times daily.    [provider]  furosemide (LASIX) 20 MG tablet Take 1 tablet (20 mg total) by mouth daily. 05/16/19   Roxan Hockey, MD  gabapentin (NEURONTIN) 300 MG capsule Take 1 capsule (300 mg total) by mouth 2 (two) times daily. 05/16/19   Roxan Hockey, MD   guaiFENesin (MUCINEX) 600 MG 12 hr tablet Take 1 tablet (600 mg total) by mouth 2 (two) times daily. 07/17/19   Barton Dubois, MD  hydrocortisone (ANUSOL-HC) 2.5 % rectal cream Place rectally 4 (four) times daily. Use for 3 to 4 weeks then okay to stop 08/06/19   Roxan Hockey, MD  insulin aspart (NOVOLOG) 100 UNIT/ML FlexPen Inject 0-6 Units into the skin See admin instructions. insulin aspart (novoLOG) injection 0- 6 Units 0- 6 Units Subcutaneous, 3 times daily with meals CBG < 70: Implement Hypoglycemia Standing Orders, -- CBG 70 - 120: 0 unit CBG 121 - 150: 0 unit  CBG 151 - 200: 0 unit CBG 201 - 250: 1 units CBG 251 - 300: 2 units CBG 301 - 350: 4 units  CBG 351 - 400: 6 units  CBG > 400: 6 units 08/06/19   Emokpae, Courage, MD  Insulin Glargine (BASAGLAR KWIKPEN) 100 UNIT/ML Inject 10 Units into the skin daily.    [provider]  Insulin Lispro (ADMELOG SOLOSTAR [place]) Inject 4 Units into the skin 3 (three) times daily before meals.     [provider]  ipratropium-albuterol (DUONEB) 0.5-2.5 (3) MG/3ML SOLN Take 3 mLs by nebulization 2 (two) times daily. 03/29/19   Fayrene Helper, MD  lactulose (CHRONULAC) 10 GM/15ML solution Take 30 mLs (20 g total) by mouth 3 (three) times daily. Goal is to have 2-3 mushy stools per day-avoid constipation and avoid frank watery diarrhea type stool 08/06/19   Roxan Hockey, MD  meclizine (ANTIVERT) 25 MG tablet Take 25 mg by mouth 3 (three) times daily as needed for dizziness.    [provider]  metFORMIN (GLUCOPHAGE) 500 MG tablet Take 1 tablet (500 mg total) by mouth 2 (two) times daily with a meal. 05/16/19   Emokpae, Courage, MD  metoprolol succinate (TOPROL XL) 50 MG 24 hr tablet Take 1 tablet (50 mg total) by mouth daily. 05/16/19 05/15/20  Roxan Hockey, MD  mirtazapine (REMERON) 15 MG tablet Take 1 tablet (15 mg total) by mouth at bedtime. 05/01/19   [name], NP  nystatin-triamcinolone (MYCOLOG II) cream Apply 1  application topically 2 (two) times daily. Apply to buttocks--rash 08/06/19   Roxan Hockey, MD  rosuvastatin (CRESTOR) 5 MG tablet Take 5 mg by mouth daily.    [provider]  Vitamin D, Ergocalciferol, (DRISDOL) 1.25 MG (50000 UNIT) CAPS capsule Take 1 capsule (50,000 Units total) by mouth every 7 (seven) days. Patient taking differently: Take 50,000 Units by mouth every Monday.  04/09/19   Fayrene Helper, MD    Current Facility-Administered Medications  Medication Dose Route Frequency Provider Last Rate Last Admin  . aspirin EC tablet 81 mg  81 mg Oral Q breakfast Truett Mainland, DO   81 mg at 08/18/19 1117  . diltiazem (CARDIZEM CD) 24 hr capsule 240 mg  240 mg Oral Daily Truett Mainland, DO   240 mg at  08/18/19 1116  . enoxaparin (LOVENOX) injection 40 mg  40 mg Subcutaneous QHS Truett Mainland, DO   40 mg at 08/17/19 2349  . famotidine (PEPCID) tablet 20 mg  20 mg Oral BID Truett Mainland, DO   20 mg at 08/18/19 1117  . fluticasone (FLONASE) 50 MCG/ACT nasal spray 1 spray  1 spray Each Nare Daily Truett Mainland, DO   1 spray at 08/18/19 1200  . furosemide (LASIX) tablet 20 mg  20 mg Oral Daily Truett Mainland, DO   20 mg at 08/18/19 1116  . gabapentin (NEURONTIN) capsule 300 mg  300 mg Oral BID Truett Mainland, DO   300 mg at 08/18/19 1116  . insulin aspart (novoLOG) injection 0-15 Units  0-15 Units Subcutaneous TID WC Truett Mainland, DO   2 Units at 08/18/19 1115  . insulin aspart (novoLOG) injection 0-5 Units  0-5 Units Subcutaneous QHS Stinson, Jacob J, DO      . insulin aspart (novoLOG) injection 4 Units  4 Units Subcutaneous TID AC Truett Mainland, DO   4 Units at 08/18/19 1116  . insulin glargine (LANTUS) injection 10 Units  10 Units Subcutaneous Daily Truett Mainland, DO   10 Units at 08/18/19 1115  . ipratropium-albuterol (DUONEB) 0.5-2.5 (3) MG/3ML nebulizer solution 3 mL  3 mL Nebulization BID Truett Mainland, DO   3 mL at 08/18/19 [id number]  . lactulose  (CHRONULAC) 10 GM/15ML solution 30 g  30 g Oral TID Truett Mainland, DO   30 g at 08/18/19 1116  . meclizine (ANTIVERT) tablet 25 mg  25 mg Oral TID PRN Truett Mainland, DO      . metoprolol succinate (TOPROL-XL) 24 hr tablet 50 mg  50 mg Oral Daily Truett Mainland, DO   50 mg at 08/18/19 1117  . mirtazapine (REMERON) tablet 15 mg  15 mg Oral QHS Stinson, Jacob J, DO      . mometasone-formoterol (DULERA) 100-5 MCG/ACT inhaler 2 puff  2 puff Inhalation BID Truett Mainland, DO   2 puff at 08/18/19 [id number]  . rifaximin (XIFAXAN) tablet 550 mg  550 mg Oral BID Baylin Gamblin U, MD      . rosuvastatin (CRESTOR) tablet 5 mg  5 mg Oral QHS Truett Mainland, DO        Allergies as of 08/17/2019  . (No Known Allergies)    Family History  Problem Relation Age of Onset  . Diabetes Mellitus II Mother   . Pneumonia Father     Social History   Socioeconomic History  . Marital status: Divorced    Spouse name: Not on file  . Number of children: Not on file  . Years of education: Not on file  . Highest education level: Not on file  Occupational History  . Not on file  Tobacco Use  . Smoking status: Current Every Day Smoker    Types: Cigarettes  . Smokeless tobacco: Never Used  Substance and Sexual Activity  . Alcohol use: Never  . Drug use: Never  . Sexual activity: Not on file  Other Topics Concern  . Not on file  Social History Narrative  . Not on file   Social Determinants of Health   Financial Resource Strain:   . Difficulty of Paying Living Expenses:   Food Insecurity:   . Worried About Charity fundraiser in the Last Year:   . [place] in the Last Year:  Transportation Needs:   . Film/video editor (Medical):   Marland Kitchen Lack of Transportation (Non-Medical):   Physical Activity:   . Days of Exercise per Week:   . Minutes of Exercise per Session:   Stress:   . Feeling of Stress :   Social Connections:   . Frequency of Communication with Friends and Family:   .  Frequency of Social Gatherings with Friends and Family:   . Attends Religious Services:   . Active Member of Clubs or Organizations:   . Attends Archivist Meetings:   Marland Kitchen Marital Status:   Intimate Partner Violence:   . Fear of Current or Ex-Partner:   . Emotionally Abused:   Marland Kitchen Physically Abused:   . Sexually Abused:     Review of Systems: See HPI, otherwise normal ROS  Physical Exam: Temp:  [97.4 F (36.3 C)-98.7 F (37.1 C)] 98.7 F (37.1 C) (05/22 1333) Pulse Rate:  [63-108] 94 (05/22 1333) Resp:  [16-26] 17 (05/22 0449) BP: (103-148)/(52-119) 103/57 (05/22 1333) SpO2:  [94 %-100 %] 95 % (05/22 1333) Weight:  [78.1 kg] 78.1 kg (05/21 2103) Last BM Date: 08/18/19  Patient is awake and alert.  She is not oriented to time. She knows she is in the hospital. Asterixis present. Conjunctivitis pink.  Sclerae nonicteric. Pupils are equal and reactive to light. Oropharyngeal mucosa is normal.  She is edentulous. Neck without masses thyromegaly or lymphadenopathy. Cardiac exam with irregular rhythm normal S1 and S2.  No murmur or gallop noted. Auscultation lungs reveal diminished intensity of breath sounds bilaterally.  No rales or rhonchi. Abdomen is symmetrical soft and nontender with organomegaly or masses. He does not have peripheral edema or clubbing.   Lab Results: Recent Labs    08/17/19 1528 08/18/19 0623  WBC 11.6* 17.2*  HGB 13.3 13.6  HCT 43.1 43.4  PLT 353 366   BMET Recent Labs    08/17/19 1528 08/18/19 0623  NA 146* 144  K 4.5 3.4*  CL 102 102  CO2 30 32  GLUCOSE 186* 147*  BUN 12 13  CREATININE 0.66 0.51  CALCIUM 9.4 9.3   LFT Recent Labs    08/17/19 1528  PROT 6.5  ALBUMIN 2.8*  AST 24  ALT 20  ALKPHOS 106  BILITOT 0.9   PT/INR Recent Labs    08/18/19 0623  LABPROT 13.5  INR 1.1   Hepatitis Panel No results for input(s): HEPBSAG, HCVAB, HEPAIGM, HEPBIGM in the last 72 hours.  Studies/Results: DG Chest Port 1  View  Result Date: 08/17/2019 CLINICAL DATA:  Weakness and elevated ammonia level EXAM: PORTABLE CHEST 1 VIEW COMPARISON:  08/04/2019 FINDINGS: Cardiac shadow is enlarged but stable. Aortic calcifications are again seen. The lungs are well aerated bilaterally. Mild vascular congestion is again noted centrally. No edema is seen. No focal infiltrate is noted. Mild scarring at the left base is noted. IMPRESSION: Chronic changes without acute abnormality. Electronically Signed   By: Inez Catalina M.D.   On: 08/17/2019 15:39   US Abdomen Limited RUQ  Result Date: 08/18/2019 CLINICAL DATA:  Cirrhosis of the liver. EXAM: ULTRASOUND ABDOMEN LIMITED RIGHT UPPER QUADRANT COMPARISON:  None. FINDINGS: Gallbladder: Post cholecystectomy.  No complicating features. Common bile duct: Diameter: 8.7 mm, partially obscured by bowel gas. Liver: Heterogeneous increased echogenicity with lobulated borders and atrophic appearance. Portal vein is patent on color Doppler imaging with normal direction of blood flow towards the liver. 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 Other:  None. 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 Normal direction of flow of the main portal vein. Electronically Signed   By: Fidela Salisbury M.D.   On: 08/18/2019 12:52    Assessment;  Patient is 79 year old Caucasian female with multiple medical problems who presents with mental status changes and mildly elevated serum ammonia level.  She has received lactulose enema and now on p.o. lactulose and more alert.  Her presentation appears to be due to hepatic encephalopathy.  She does not have obvious triggers such as an infection or GI bleed.  Serum sodium mildly elevated.  Therefore she could have been mildly dehydrated.  Patient is on gabapentin and mirtazapine.  I wonder if there contributing to her condition.  If possible dose of these medicines should be  reduced.  Etiology of cirrhosis may be fatty liver.  Extensive prior work-up unknown.  History of rectal bleeding during her hospitalization 2 weeks ago for SIRS.  Hemoglobin is now normal.  Recommendations;  Check serum alpha-fetoprotein. Xifaxan 550 mg p.o. twice daily. Consider dropping mirtazapine and gabapentin dose if possible.   LOS: 1 day   Tersa Fotopoulos  08/18/2019, 3:40 PM

## 2019-08-18 NOTE — Progress Notes (Addendum)
Patient Demographics:    Heather Newman, is a 79 y.o. female, DOB - [birth year], [id number]  Admit date - 08/17/2019   Admitting Physician Heather Mainland, DO  Outpatient Primary MD for the patient is Patient, No Pcp Per  LOS - 1   Chief Complaint  Patient presents with  . Altered Mental Status        Subjective:    Heather Newman today has no fevers, no emesis,  No chest pain,   -Less lethargic, waking up more, able to answer questions  -Called and left voicemail for patient's granddaughter Ms. Heather Newman   Assessment  & Plan :    Principal Problem:   Hepatic encephalopathy ([place]) Active Problems:   Diabetes mellitus type 2 with complications (HCC)   PAF (paroxysmal atrial fibrillation) (HCC)   Alzheimer's disease, unspecified (CODE) ([place])   History of cardiomyopathy   CAD (coronary artery disease)   Essential hypertension  BriefSummary 79 year old with dementia,COPD/bronchiectasisHTN, DM, chronic hypoxic respiratory failure on 2 L of oxygen chronically, NASH liver cirrhosis,chronic atrial fibrillation not on anticoagulation, admitted on 0000000 from [facility] with concerns about hepatic encephalopathy in the setting of elevated ammonia  A/p 1) hepatic encephalopathy--on admission ammonia was up to 124, please note it was 30 couple weeks ago -Received lactulose enema and p.o. lactulose ammonia is down to 51 -Patient becoming more awake -GI input noted   2)H/oNASHLiver Cirrhosis---okay to restart lactulose =-goal is to have 2 to 3 mushy soft stools per day, avoid constipation, avoid watery diarrhea type stools -Encephalopathic as above #1 -Decrease Remeron to 7.5 mg nightly from 50 mg nightly -Decrease gabapentin to 20 mg twice daily from 300 mg twice daily -Continue rifaximin and lactulose -INR is 1.1, Albumin is 2.8,  LFTs WNL  -Alpha-fetoprotein  pending  3)Leukocytosis noted  --- WBC 17, UA on admission not suspicious for UTI -Chest x-ray not suggestive of pneumonia -Send blood cultures  4)DM2-A1c 8.0, reflecting uncontrolled DM PTA,  -Patient was on Lantus 10 units twice daily -Hold Metformin use Novolog/Humalog Sliding scale insulin with Accu-Cheks/Fingersticks as ordered  5)ParoxysmalA. fib with RVR--- -continue Cardizem CD 240 mg daily, -Continue Toprol-XL at 50 mg daily patient was not on anticoagulation PTAdue to bleeding concerns -Recent echo with EF of 55%  6)Dementia--withbaseline memory andcognitive deficits--now with superimposed hepatic encephalopathy, -Remeron dosage decreased to 7.5 mg nightly   7)CAD--stable,chest pain-free, continue aspirin and-Continue Toprol-XL 50 mg daily -Consider discontinuation of Crestor 5 mg  8)COPD/emphysema--stable, continue  Dulera and as needed neb treatments  9) buttock rash--- suspect Candida/yeast--- Mycolog cream twice daily as ordered  10)Social/Ethics-patient is a full code -Patient's Primary contact is her granddaughter Ms. Heather Newman  ---sinceMariJois not a relativeand Not anofficial POA---and givendifficulties with communication with Heather Newman wewill limit conversations with [employer]  --- Will continue to haveconversations with patient's granddaughterKerrie   Code Status: Partial Family Communication:  -Called and left voicemail for patient's granddaughter Ms. Heather Newman  Consults : GI  DVT Prophylaxis  :  - SCDs lovenox  Lab Results  Component Value Date   PLT 366 08/18/2019    Inpatient Medications  Scheduled Meds: . aspirin EC  81 mg Oral Q breakfast  . diltiazem  240 mg Oral Daily  . enoxaparin (  LOVENOX) injection  40 mg Subcutaneous QHS  . famotidine  20 mg Oral BID  . fluticasone  1 spray Each Nare Daily  . furosemide  20 mg Oral Daily  . gabapentin  300 mg Oral BID  . insulin aspart  0-15 Units Subcutaneous TID  WC  . insulin aspart  0-5 Units Subcutaneous QHS  . insulin aspart  4 Units Subcutaneous TID AC  . insulin glargine  10 Units Subcutaneous Daily  . ipratropium-albuterol  3 mL Nebulization BID  . lactulose  30 g Oral TID  . metoprolol succinate  50 mg Oral Daily  . mirtazapine  15 mg Oral QHS  . mometasone-formoterol  2 puff Inhalation BID  . rifaximin  550 mg Oral BID  . rosuvastatin  5 mg Oral QHS   Continuous Infusions: PRN Meds:.meclizine  Anti-infectives (From admission, onward)   Start     Dose/Rate Route Frequency Ordered Stop   08/18/19 1545  rifaximin (XIFAXAN) tablet 550 mg     550 mg Oral 2 times daily 08/18/19 1538          Objective:   Vitals:   08/18/19 0449 08/18/19 0722 08/18/19 0728 08/18/19 1333  BP: 137/64   (!) 103/57  Pulse: (!) 108   94  Resp: 17     Temp: (!) 97.4 F (36.3 C)   98.7 F (37.1 C)  TempSrc: Axillary   Oral  SpO2: 98% 94% 94% 95%  Weight:        Wt Readings from Last 3 Encounters:  08/17/19 78.1 kg  08/05/19 83.7 kg  07/17/19 84 kg    No intake or output data in the 24 hours ending 08/18/19 1649   Physical Exam  Gen:-Was very lethargic earlier, becoming more awake  HEENT:- [employer].AT, No sclera icterus Neck-Supple Neck,No JVD,.  Lungs-  CTAB , fair symmetrical air movement CV- S1, S2 normal, irregular  Abd-  +ve B.Sounds, Abd Soft, No tenderness,    Extremity:- No  edema, pedal pulses present  Psych-currently lethargic and disoriented  neuro-generalized weakness, no new focal deficits, no tremors Skin--erythematous rash around her buttocks with satellite lesions suggestive of Candida intertrigo   Data Review:   Micro Results Recent Results (from the past 240 hour(s))  SARS Coronavirus 2 by RT PCR (hospital order, performed in [facility] hospital lab) Nasopharyngeal Nasopharyngeal Swab     Status: None   Collection Time: 08/17/19  6:00 PM   Specimen: Nasopharyngeal Swab  Result Value Ref Range Status   SARS Coronavirus  2 NEGATIVE NEGATIVE Final    Comment: (NOTE) SARS-CoV-2 target nucleic acids are NOT DETECTED. The SARS-CoV-2 RNA is generally detectable in upper and lower respiratory specimens during the acute phase of infection. The lowest concentration of SARS-CoV-2 viral copies this assay can detect is 250 copies / mL. A negative result does not preclude SARS-CoV-2 infection and should not be used as the sole basis for treatment or other patient management decisions.  A negative result may occur with improper specimen collection / handling, submission of specimen other than nasopharyngeal swab, presence of viral mutation(s) within the areas targeted by this assay, and inadequate number of viral copies (<250 copies / mL). A negative result must be combined with clinical observations, patient history, and epidemiological information. Fact Sheet for Patients:   StrictlyIdeas.no Fact Sheet for Healthcare Providers: BankingDealers.co.za This test is not yet approved or cleared  by the [place] FDA and has been authorized for detection and/or diagnosis of SARS-CoV-2 by  FDA under an Emergency Use Authorization (EUA).  This EUA will remain in effect (meaning this test can be used) for the duration of the COVID-19 declaration under Section 564(b)(1) of the Act, 21 U.S.C. section 360bbb-3(b)(1), unless the authorization is terminated or revoked sooner. Performed at [facility], [address], [place], [place] [address]     Radiology Reports DG Chest [place] 1 View  Result Date: 08/17/2019 CLINICAL DATA:  Weakness and elevated ammonia level EXAM: PORTABLE CHEST 1 VIEW COMPARISON:  08/04/2019 FINDINGS: Cardiac shadow is enlarged but stable. Aortic calcifications are again seen. The lungs are well aerated bilaterally. Mild vascular congestion is again noted centrally. No edema is seen. No focal infiltrate is noted. Mild scarring at the left base is noted.  IMPRESSION: Chronic changes without acute abnormality. Electronically Signed   By: Inez Catalina M.D.   On: 08/17/2019 15:39   DG Chest [place] 1 View  Result Date: 08/04/2019 CLINICAL DATA:  Fever, rectal bleeding EXAM: PORTABLE CHEST 1 VIEW COMPARISON:  07/15/2019 FINDINGS: Single frontal view of the chest demonstrates an enlarged cardiac silhouette, stable. Chronic central vascular congestion without airspace disease, effusion, or pneumothorax. No acute bony abnormalities. IMPRESSION: 1. Stable chest, no acute process. Electronically Signed   By: Randa Ngo M.D.   On: 08/04/2019 03:35   US Abdomen Limited RUQ  Result Date: 08/18/2019 CLINICAL DATA:  Cirrhosis of the liver. EXAM: ULTRASOUND ABDOMEN LIMITED RIGHT UPPER QUADRANT COMPARISON:  None. FINDINGS: Gallbladder: Post cholecystectomy.  No complicating features. Common bile duct: Diameter: 8.7 mm, partially obscured by bowel gas. Liver: Heterogeneous increased echogenicity with lobulated borders and atrophic appearance. Portal vein is patent on color Doppler imaging with normal direction of blood flow towards the liver. 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 Other: None. 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 Normal direction of flow of the main portal vein. Electronically Signed   By: Fidela Salisbury M.D.   On: 08/18/2019 12:52     CBC Recent Labs  Lab 08/17/19 1528 08/18/19 0623  WBC 11.6* 17.2*  HGB 13.3 13.6  HCT 43.1 43.4  PLT 353 366  MCV 103.4* 100.5*  MCH 31.9 31.5  MCHC 30.9 31.3  RDW 15.4 15.0  LYMPHSABS 2.2  --   MONOABS 1.5*  --   EOSABS 0.3  --   BASOSABS 0.1  --     Chemistries  Recent Labs  Lab 08/17/19 1528 08/18/19 0623  NA 146* 144  K 4.5 3.4*  CL 102 102  CO2 30 32  GLUCOSE 186* 147*  BUN 12 13  CREATININE 0.66 0.51  CALCIUM 9.4 9.3  AST 24  --   ALT 20  --   ALKPHOS 106  --   BILITOT 0.9  --     ------------------------------------------------------------------------------------------------------------------ No results for input(s): CHOL, HDL, LDLCALC, TRIG, CHOLHDL, LDLDIRECT in the last 72 hours.  Lab Results  Component Value Date   HGBA1C 8.0 (H) 08/04/2019   ------------------------------------------------------------------------------------------------------------------ No results for input(s): TSH, T4TOTAL, T3FREE, THYROIDAB in the last 72 hours.  Invalid input(s): FREET3 ------------------------------------------------------------------------------------------------------------------ No results for input(s): VITAMINB12, FOLATE, FERRITIN, TIBC, IRON, RETICCTPCT in the last 72 hours.  Coagulation profile Recent Labs  Lab 08/18/19 0623  INR 1.1    No results for input(s): DDIMER in the last 72 hours.  Cardiac Enzymes No results for input(s): CKMB, TROPONINI, MYOGLOBIN in the last 168 hours.  Invalid input(s): CK ------------------------------------------------------------------------------------------------------------------  Component Value Date/Time   BNP 540.0 (H) 08/17/2019 1528     Roxan Hockey M.D on 08/18/2019 at 4:49 PM  Go to [email or web address] - for contact info  [facility] - Office  [phone]

## 2019-08-19 LAB — GLUCOSE, CAPILLARY
Glucose-Capillary: 149 mg/dL — ABNORMAL HIGH (ref 70–99)
Glucose-Capillary: 226 mg/dL — ABNORMAL HIGH (ref 70–99)
Glucose-Capillary: 170 mg/dL — ABNORMAL HIGH (ref 70–99)

## 2019-08-19 MED ORDER — NYSTATIN-TRIAMCINOLONE 100000-0.1 UNIT/GM-% EX CREA
TOPICAL_CREAM | Freq: Two times a day (BID) | CUTANEOUS | Status: DC
  Filled 2019-08-19: qty 15

## 2019-08-19 MED ORDER — ACETAMINOPHEN 325 MG PO TABS
650.0000 mg | ORAL_TABLET | Freq: Once | ORAL | Status: AC
  Administered 2019-08-19: 650 mg via ORAL
  Filled 2019-08-19: qty 2

## 2019-08-19 NOTE — Progress Notes (Signed)
Patient family voiced concerns about physician communication.  Patient family wishes to be present with doctor to discuss concerns about patient since she has cognition issues.

## 2019-08-19 NOTE — Progress Notes (Addendum)
  Subjective:  Patient has no complaints.  She states she did eat some of her breakfast.  She states Marianna Fuss is a Conservator, museum/gallery.  She states her daughter Neoma Laming has passed away.  She denies abdominal pain.  Objective: Blood pressure (!) 94/56, pulse 88, temperature 97.7 F (36.5 C), temperature source Axillary, resp. rate 16, weight 78.1 kg, SpO2 95 %. Patient is alert and smiling. She does not have asterixis. Her speech is mildly impaired easily understood. Abdominal exam is unremarkable. Does not have peripheral edema.  I noted patient was able to pick up ginger ale can from site table and able to drink without any difficulty.  Labs/studies Results:  CBC Latest Ref Rng & Units 08/18/2019 08/17/2019 08/06/2019  WBC 4.0 - 10.5 K/uL 17.2(H) 11.6(H) 10.7(H)  Hemoglobin 12.0 - 15.0 g/dL 13.6 13.3 10.9(L)  Hematocrit 36.0 - 46.0 % 43.4 43.1 34.3(L)  Platelets 150 - 400 K/uL 366 353 128(L)    CMP Latest Ref Rng & Units 08/18/2019 08/17/2019 08/06/2019  Glucose 70 - 99 mg/dL 147(H) 186(H) 132(H)  BUN 8 - 23 mg/dL _0 Creatinine 0.44 - 1.00 mg/dL 0.51 0.66 0.56  Sodium 135 - 145 mmol/L 144 146(H) 141  Potassium 3.5 - 5.1 mmol/L 3.4(L) 4.5 3.7  Chloride 98 - 111 mmol/L 102 102 106  CO2 22 - 32 mmol/L 32 30 29  Calcium 8.9 - 10.3 mg/dL 9.3 9.4 8.1(L)  Total Protein 6.5 - 8.1 g/dL - 6.5 -  Total Bilirubin 0.3 - 1.2 mg/dL - 0.9 -  Alkaline Phos 38 - 126 U/L - 106 -  AST 15 - 41 U/L - 24 -  ALT 0 - 44 U/L - 20 -    Hepatic Function Latest Ref Rng & Units 08/17/2019 08/04/2019 07/15/2019  Total Protein 6.5 - 8.1 g/dL 6.5 7.1 6.1(L)  Albumin 3.5 - 5.0 g/dL 2.8(L) 3.4(L) 2.8(L)  AST 15 - 41 U/L 24 24 69(H)  ALT 0 - 44 U/L 20 30 67(H)  Alk Phosphatase 38 - 126 U/L 106 138(H) 108  Total Bilirubin 0.3 - 1.2 mg/dL 0.9 1.8(H) 1.2     Assessment:  #1.  Hepatic encephalopathy.  Significant symptomatic improvement in the last 24 hours. I I was finally able to talk with patient's granddaughter  [name] yesterday afternoon and was told that her grandmother was diagnosed cirrhosis over 20 years ago.  Etiology felt to be NASH.  She had work-up when she was living in state of [place].  Recommendations  Continue lactulose and Xifaxan. Lactulose dose should be titrated to a goal of 3-4 bowel movements per day.

## 2019-08-19 NOTE — Progress Notes (Signed)
Patient Demographics:    Zavia Rozario, is a 79 y.o. female, DOB - 1940-04-07, [id number]  Admit date - 08/17/2019   Admitting Physician No admitting provider for patient encounter.  Outpatient Primary MD for the patient is Patient, No Pcp Per  LOS - 2   Chief Complaint  Patient presents with  . Altered Mental Status        Subjective:    Mikyah Tonthat today has no fevers, no emesis,  No chest pain,    Patient is more coherent, much less lethargic, -Patient refused her medications this morning -Spent quite a bit of time trying to convince her to take her medications and she initially  agreed to take them, when we actually brought the lactulose patient change her mind and refused  -Patient states that she does not like to take lactulose because she does not like to have diarrhea  -I was finally able to get hold of patient's granddaughter Ms. Esmond Harps after multiple phone calls and voice messages left for her since 08/18/2019--updated her on improvement in clinical condition and ammonia levels -Discussed potentially decreasing Remeron to 7.5 mg qhs and decreasing gabapentin to 200 mg twice daily  --Patient granddaughter  Ms. Esmond Harps will try and see if she can convince patient to drink the lactulose when she gets here today she plans to visit this afternoon  -Dr. Laural Golden the  gastroenterologist was also able to speak with patient's granddaughter after leaving voicemails previously for her    Assessment  & Plan :    Principal Problem:   Hepatic encephalopathy ([place]) Active Problems:   Diabetes mellitus type 2 with complications ([place])   PAF (paroxysmal atrial fibrillation) ([place])   Alzheimer's disease, unspecified (CODE) ([place])   History of cardiomyopathy   CAD (coronary artery disease)   Essential hypertension  BriefSummary 78 year old with dementia,COPD/bronchiectasisHTN,  DM, chronic hypoxic respiratory failure on 2 L of oxygen chronically, NASH liver cirrhosis,chronic atrial fibrillation not on anticoagulation, admitted on 0000000 from [facility] with concerns about hepatic encephalopathy in the setting of elevated ammonia  A/p 1)Hepatic Encephalopathy--on admission ammonia was up to 124, please note that ammonia was 30 a couple weeks ago -Received lactulose enema and p.o. lactulose ammonia is down to 51 -Patient becoming more awake -GI input noted -Started on rifaximin by Dr. Laural Golden -Patient encouraged to be compliant with lactulose -Please see subjective section compliant with lactulose appears to be an issue -We will repeat LFTs and serum ammonia levels in a.m.  2)H/oNASHLiver Cirrhosis---questions about compliance with lactulose =-goal is to have 2 to 3 mushy soft stools per day, avoid constipation, avoid watery diarrhea type stools -Encephalopathy improving as above #1 -Decreased Remeron to 7.5 mg nightly from 50 mg nightly -Decreased gabapentin to 20 mg twice daily from 300 mg twice daily -Continue rifaximin and lactulose -INR is 1.1, Albumin is 2.8,  LFTs WNL  -Alpha-fetoprotein pending  3)Leukocytosis noted  --- WBC 17, UA on admission not suspicious for UTI -Chest x-ray not suggestive of pneumonia -Blood cultures NGTD  4)DM2-A1c 8.0, reflecting uncontrolled DM PTA,  -Patient was on Lantus 10 units twice daily -Hold Metformin use Novolog/Humalog Sliding scale insulin with Accu-Cheks/Fingersticks as ordered  5)ParoxysmalA. fib with RVR--- -continue Cardizem CD 240 mg daily, -  Continue Toprol-XL at 50 mg daily patient was not on anticoagulation PTAdue to bleeding concerns -Recent echo with EF of 55%  6)Dementia--withbaseline memory andcognitive deficits--now with superimposed hepatic encephalopathy, -Remeron dosage decreased to 7.5 mg nightly   7)CAD--stable,chest pain-free, continue aspirin and-Continue Toprol-XL 50 mg  daily -Consider discontinuation of Crestor 5 mg  8)COPD/emphysema--stable, continue  Dulera and as needed neb treatments  9) buttock rash--- suspect Candida/yeast--- Mycolog cream twice daily as ordered  10)Social/Ethics-patient is a full code -Patient's Primary contact is her granddaughter Ms. [name]  ---sinceMariJois not a relativeand Not anofficial POA---and givendifficulties with communication with MariJo wewill limit conversations with MariJo  --- Will continue to haveconversations with patient's granddaughterKerrie   Code Status: Partial Family Communication:  -Called and updated patient's granddaughter Ms. Barney Drain 08/19/2019, please see subjective section above  Consults : GI  DVT Prophylaxis  :  - SCDs lovenox  Lab Results  Component Value Date   PLT 366 08/18/2019    Inpatient Medications  Scheduled Meds: . aspirin EC  81 mg Oral Q breakfast  . diltiazem  240 mg Oral Daily  . enoxaparin (LOVENOX) injection  40 mg Subcutaneous QHS  . famotidine  20 mg Oral BID  . fluticasone  1 spray Each Nare Daily  . furosemide  20 mg Oral Daily  . gabapentin  200 mg Oral BID  . insulin aspart  0-15 Units Subcutaneous TID WC  . insulin aspart  0-5 Units Subcutaneous QHS  . insulin glargine  10 Units Subcutaneous Daily  . ipratropium-albuterol  3 mL Nebulization BID  . lactulose  30 g Oral TID  . metoprolol succinate  50 mg Oral Daily  . mirtazapine  7.5 mg Oral QHS  . mometasone-formoterol  2 puff Inhalation BID  . nystatin   Topical TID  . nystatin-triamcinolone   Topical BID  . rifaximin  550 mg Oral BID  . rosuvastatin  5 mg Oral QHS   Continuous Infusions: PRN Meds:.meclizine  Anti-infectives (From admission, onward)   Start     Dose/Rate Route Frequency Ordered Stop   08/18/19 1545  rifaximin (XIFAXAN) tablet 550 mg     550 mg Oral 2 times daily 08/18/19 1538          Objective:   Vitals:   08/18/19 2004 08/18/19 2041  08/19/19 0527 08/19/19 0700  BP:  (!) 113/54 (!) 94/56   Pulse:  69 88   Resp:  16 16   Temp:  99 F (37.2 C) 97.7 F (36.5 C)   TempSrc:  Axillary Axillary   SpO2: 93% 98% 98% 95%  Weight:        Wt Readings from Last 3 Encounters:  08/17/19 78.1 kg  08/05/19 83.7 kg  07/17/19 84 kg    No intake or output data in the 24 hours ending 08/19/19 1050   Physical Exam  Gen:-More awake, more coherent   HEENT:- [place].AT, No sclera icterus Neck-Supple Neck,No JVD,.  Lungs-  CTAB , fair symmetrical air movement CV- S1, S2 normal, irregular  Abd-  +ve B.Sounds, Abd Soft, No tenderness,    Extremity:- No  edema, pedal pulses present  Psych-baseline cognitive and memory deficits, patient more cooperative and more alert   neuro-generalized weakness, no new focal deficits, no tremors Skin--erythematous rash around her buttocks with satellite lesions suggestive of Candida intertrigo   Data Review:   Micro Results Recent Results (from the past 240 hour(s))  SARS Coronavirus 2 by RT PCR (hospital order, performed in  [facility] hospital lab) Nasopharyngeal Nasopharyngeal Swab     Status: None   Collection Time: 08/17/19  6:00 PM   Specimen: Nasopharyngeal Swab  Result Value Ref Range Status   SARS Coronavirus 2 NEGATIVE NEGATIVE Final    Comment: (NOTE) SARS-CoV-2 target nucleic acids are NOT DETECTED. The SARS-CoV-2 RNA is generally detectable in upper and lower respiratory specimens during the acute phase of infection. The lowest concentration of SARS-CoV-2 viral copies this assay can detect is 250 copies / mL. A negative result does not preclude SARS-CoV-2 infection and should not be used as the sole basis for treatment or other patient management decisions.  A negative result may occur with improper specimen collection / handling, submission of specimen other than nasopharyngeal swab, presence of viral mutation(s) within the areas targeted by this assay, and inadequate number of  viral copies (<250 copies / mL). A negative result must be combined with clinical observations, patient history, and epidemiological information. Fact Sheet for Patients:   StrictlyIdeas.no Fact Sheet for Healthcare Providers: BankingDealers.co.za This test is not yet approved or cleared  by the [place] FDA and has been authorized for detection and/or diagnosis of SARS-CoV-2 by FDA under an Emergency Use Authorization (EUA).  This EUA will remain in effect (meaning this test can be used) for the duration of the COVID-19 declaration under Section 564(b)(1) of the Act, 21 U.S.C. section 360bbb-3(b)(1), unless the authorization is terminated or revoked sooner. Performed at [facility], [address] [place], [place] [address]   Culture, blood (Routine X 2) w Reflex to ID Panel     Status: None (Preliminary result)   Collection Time: 08/18/19  5:47 PM   Specimen: BLOOD RIGHT FOREARM  Result Value Ref Range Status   Specimen Description BLOOD RIGHT FOREARM  Final   Special Requests   Final    BOTTLES DRAWN AEROBIC AND ANAEROBIC Blood Culture adequate volume   Culture   Final    NO GROWTH < 24 HOURS Performed at [facility], [address], [place],  [address]    Report Status PENDING  Incomplete  Culture, blood (Routine X 2) w Reflex to ID Panel     Status: None (Preliminary result)   Collection Time: 08/18/19  5:48 PM   Specimen: BLOOD LEFT HAND  Result Value Ref Range Status   Specimen Description BLOOD LEFT HAND  Final   Special Requests   Final    BOTTLES DRAWN AEROBIC ONLY Blood Culture adequate volume   Culture   Final    NO GROWTH < 24 HOURS Performed at [facility], [address], [place],  [address]    Report Status PENDING  Incomplete    Radiology Reports DG Chest Port 1 View  Result Date: 08/17/2019 CLINICAL DATA:  Weakness and elevated ammonia level EXAM: PORTABLE CHEST 1 VIEW COMPARISON:   08/04/2019 FINDINGS: Cardiac shadow is enlarged but stable. Aortic calcifications are again seen. The lungs are well aerated bilaterally. Mild vascular congestion is again noted centrally. No edema is seen. No focal infiltrate is noted. Mild scarring at the left base is noted. IMPRESSION: Chronic changes without acute abnormality. Electronically Signed   By: Inez Catalina M.D.   On: 08/17/2019 15:39   DG Chest Port 1 View  Result Date: 08/04/2019 CLINICAL DATA:  Fever, rectal bleeding EXAM: PORTABLE CHEST 1 VIEW COMPARISON:  07/15/2019 FINDINGS: Single frontal view of the chest demonstrates an enlarged cardiac silhouette, stable. Chronic central vascular congestion without airspace disease, effusion, or pneumothorax.  No acute bony abnormalities. IMPRESSION: 1. Stable chest, no acute process. Electronically Signed   By: Randa Ngo M.D.   On: 08/04/2019 03:35   US Abdomen Limited RUQ  Result Date: 08/18/2019 CLINICAL DATA:  Cirrhosis of the liver. EXAM: ULTRASOUND ABDOMEN LIMITED RIGHT UPPER QUADRANT COMPARISON:  None. FINDINGS: Gallbladder: Post cholecystectomy.  No complicating features. Common bile duct: Diameter: 8.7 mm, partially obscured by bowel gas. Liver: Heterogeneous increased echogenicity with lobulated borders and atrophic appearance. Portal vein is patent on color Doppler imaging with normal direction of blood flow towards the liver. 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 Other: None. 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 Normal direction of flow of the main portal vein. Electronically Signed   By: Fidela Salisbury M.D.   On: 08/18/2019 12:52     CBC Recent Labs  Lab 08/17/19 1528 08/18/19 0623  WBC 11.6* 17.2*  HGB 13.3 13.6  HCT 43.1 43.4  PLT 353 366  MCV 103.4* 100.5*  MCH 31.9 31.5  MCHC 30.9 31.3  RDW 15.4 15.0  LYMPHSABS 2.2  --   MONOABS 1.5*  --   EOSABS 0.3  --   BASOSABS 0.1  --      Chemistries  Recent Labs  Lab 08/17/19 1528 08/18/19 0623  NA 146* 144  K 4.5 3.4*  CL 102 102  CO2 30 32  GLUCOSE 186* 147*  BUN 12 13  CREATININE 0.66 0.51  CALCIUM 9.4 9.3  AST 24  --   ALT 20  --   ALKPHOS 106  --   BILITOT 0.9  --    ------------------------------------------------------------------------------------------------------------------ No results for input(s): CHOL, HDL, LDLCALC, TRIG, CHOLHDL, LDLDIRECT in the last 72 hours.  Lab Results  Component Value Date   HGBA1C 8.0 (H) 08/04/2019   ------------------------------------------------------------------------------------------------------------------ No results for input(s): TSH, T4TOTAL, T3FREE, THYROIDAB in the last 72 hours.  Invalid input(s): FREET3 ------------------------------------------------------------------------------------------------------------------ No results for input(s): VITAMINB12, FOLATE, FERRITIN, TIBC, IRON, RETICCTPCT in the last 72 hours.  Coagulation profile Recent Labs  Lab 08/18/19 0623  INR 1.1    No results for input(s): DDIMER in the last 72 hours.  Cardiac Enzymes No results for input(s): CKMB, TROPONINI, MYOGLOBIN in the last 168 hours.  Invalid input(s): CK ------------------------------------------------------------------------------------------------------------------    Component Value Date/Time   BNP 540.0 (H) 08/17/2019 1528     Roxan Hockey M.D on 08/19/2019 at 10:50 AM  Go to [email or web address] - for contact info  [facility] - Office  [phone]

## 2019-08-19 NOTE — Progress Notes (Signed)
Patient's family states she is lactose intolerant

## 2019-08-20 LAB — AMMONIA: Ammonia: 59 umol/L — ABNORMAL HIGH (ref 9–35)

## 2019-08-20 LAB — CBC
HCT: 33.9 % — ABNORMAL LOW (ref 36.0–46.0)
Hemoglobin: 10.8 g/dL — ABNORMAL LOW (ref 12.0–15.0)
MCH: 32.1 pg (ref 26.0–34.0)
MCHC: 31.9 g/dL (ref 30.0–36.0)
MCV: 100.9 fL — ABNORMAL HIGH (ref 80.0–100.0)
Platelets: 281 10*3/uL (ref 150–400)
RBC: 3.36 MIL/uL — ABNORMAL LOW (ref 3.87–5.11)
RDW: 14.7 % (ref 11.5–15.5)
WBC: 10 10*3/uL (ref 4.0–10.5)
nRBC: 0 % (ref 0.0–0.2)

## 2019-08-20 LAB — COMPREHENSIVE METABOLIC PANEL
ALT: 13 U/L (ref 0–44)
AST: 17 U/L (ref 15–41)
Albumin: 2.3 g/dL — ABNORMAL LOW (ref 3.5–5.0)
Alkaline Phosphatase: 79 U/L (ref 38–126)
Anion gap: 10 (ref 5–15)
BUN: 12 mg/dL (ref 8–23)
CO2: 30 mmol/L (ref 22–32)
Calcium: 8.4 mg/dL — ABNORMAL LOW (ref 8.9–10.3)
Chloride: 99 mmol/L (ref 98–111)
Creatinine, Ser: 0.44 mg/dL (ref 0.44–1.00)
GFR calc Af Amer: >60 mL/min (ref >60)
GFR calc non Af Amer: >60 mL/min (ref >60)
Glucose, Bld: 114 mg/dL — ABNORMAL HIGH (ref 70–99)
Potassium: 2.9 mmol/L — ABNORMAL LOW (ref 3.5–5.1)
Sodium: 139 mmol/L (ref 135–145)
Total Bilirubin: 2 mg/dL — ABNORMAL HIGH (ref 0.3–1.2)
Total Protein: 5.3 g/dL — ABNORMAL LOW (ref 6.5–8.1)

## 2019-08-20 LAB — GLUCOSE, CAPILLARY
Glucose-Capillary: 126 mg/dL — ABNORMAL HIGH (ref 70–99)
Glucose-Capillary: 189 mg/dL — ABNORMAL HIGH (ref 70–99)

## 2019-08-20 LAB — AFP TUMOR MARKER: AFP, Serum, Tumor Marker: 3.6 ng/mL (ref 0.0–8.3)

## 2019-08-20 LAB — MAGNESIUM: Magnesium: 1.6 mg/dL — ABNORMAL LOW (ref 1.7–2.4)

## 2019-08-20 MED ORDER — MAGNESIUM SULFATE 2 GM/50ML IV SOLN
2.0000 g | Freq: Once | INTRAVENOUS | Status: AC
  Administered 2019-08-20: 2 g via INTRAVENOUS
  Filled 2019-08-20: qty 50

## 2019-08-20 MED ORDER — RIFAXIMIN 550 MG PO TABS: 550.0000 mg | ORAL_TABLET | Freq: Two times a day (BID) | ORAL | 3 refills | Status: DC

## 2019-08-20 MED ORDER — HYDROCORTISONE (PERIANAL) 2.5 % EX CREA: TOPICAL_CREAM | Freq: Two times a day (BID) | CUTANEOUS | 0 refills | Status: DC

## 2019-08-20 MED ORDER — POTASSIUM CHLORIDE ER 20 MEQ PO TBCR: 20.0000 meq | EXTENDED_RELEASE_TABLET | ORAL | 1 refills | Status: DC

## 2019-08-20 MED ORDER — GABAPENTIN 100 MG PO CAPS: 200.0000 mg | ORAL_CAPSULE | Freq: Two times a day (BID) | ORAL | 3 refills | Status: DC

## 2019-08-20 MED ORDER — TRAMADOL HCL 50 MG PO TABS: 50.0000 mg | ORAL_TABLET | Freq: Two times a day (BID) | ORAL | 0 refills | Status: DC | PRN

## 2019-08-20 MED ORDER — NYSTATIN-TRIAMCINOLONE 100000-0.1 UNIT/GM-% EX CREA: 1.0000 "application " | TOPICAL_CREAM | Freq: Two times a day (BID) | CUTANEOUS | 0 refills | Status: DC

## 2019-08-20 MED ORDER — POTASSIUM CHLORIDE CRYS ER 20 MEQ PO TBCR
40.0000 meq | EXTENDED_RELEASE_TABLET | ORAL | Status: AC
  Administered 2019-08-20 (×2): 40 meq via ORAL
  Filled 2019-08-20 (×2): qty 2

## 2019-08-20 MED ORDER — LACTULOSE 10 GM/15ML PO SOLN: 20.0000 g | Freq: Two times a day (BID) | ORAL | 3 refills | Status: DC

## 2019-08-20 MED ORDER — MIRTAZAPINE 7.5 MG PO TABS: 7.5000 mg | ORAL_TABLET | Freq: Every day | ORAL | 1 refills | Status: DC

## 2019-08-20 MED ORDER — ASPIRIN 81 MG PO TBEC: 81.0000 mg | DELAYED_RELEASE_TABLET | Freq: Every day | ORAL | 3 refills | Status: DC

## 2019-08-20 MED ORDER — NYSTATIN 100000 UNIT/GM EX POWD: Freq: Three times a day (TID) | CUTANEOUS | 0 refills | Status: DC

## 2019-08-20 NOTE — Discharge Summary (Signed)
Heather Newman, is a 79 y.o. female  DOB Dec 10, 1940  MRN [id number].  Admission date:  08/17/2019  Admitting Physician  No admitting provider for patient encounter.  Discharge Date:  08/20/2019   Primary MD  Patient, No Pcp Per  Recommendations for primary care physician for things to follow:   1)Please Change Lactulose to 30 mL (20gm) twice daily --- Goal is to have 2-3 mushy stools per day- avoid constipation and avoid frank watery diarrhea type stools----  2)If stools become too watery and too frequent please adjust lactulose dosage and please give additional potassium supplementation  3)Please Start rifaximin (XIFAXAN) tablet 550 mg twice daily in an attempt to reduce the frequency and severity of confusion associated with hepatic encephalopathy (reduce frequency and severity of episodes) elevated ammonia levels  4) sliding scale insulin as ordered ---insulin aspart (novoLOG) injection 0- 6 Units 0- 6 Units Subcutaneous, 3 times daily with meals CBG < 70: Implement Hypoglycemia Standing Orders, -- CBG 70 - 120: 0 unit CBG 121 - 150: 0 unit  CBG 151 - 200: 0 unit CBG 201 - 250: 1 units CBG 251 - 300: 2 units CBG 301 - 350: 4 units  CBG 351 - 400: 6 units  CBG > 400: 6 units  5)Please repeat CBC and CMP test every Thursday for the next 2 weeks starting on Thursday, 08/23/2019  6)Please note that Mirtazapine/Remeron and Gabapentin dosages have been adjusted  7)Please Avoid ibuprofen/Advil/Aleve/Motrin/Goody Powders/Naproxen/BC powders/Meloxicam/Diclofenac/Indomethacin and other Nonsteroidal anti-inflammatory medications as these will make you more likely to bleed and can cause stomach ulcers, can also cause Kidney problems.   8)Patient's Primary contact is her granddaughter Ms. [name]----[phone]  9)Patient's family/granddaughter [name] like to have outpatient orthopedic  evaluation for patient due to concerns about lumbar/back pain and ambulatory dysfunction  10)Patient is yet to complete her Covid vaccination--- please facilitate administration of the second dose of Covid vaccine to patient   Admission Diagnosis  Hepatic encephalopathy ([place]) [K72.90]   Discharge Diagnosis  Hepatic encephalopathy ([place]) [K72.90]    Principal Problem:   Hepatic encephalopathy ([place]) Active Problems:   Diabetes mellitus type 2 with complications ([place])   PAF (paroxysmal atrial fibrillation) ([facility])   Alzheimer's disease, unspecified (CODE) ([place])   History of cardiomyopathy   CAD (coronary artery disease)   Essential hypertension      Past Medical History:  Diagnosis Date  . Alzheimer's disease ([place])   . Atherosclerosis of native arteries of right leg with ulceration of other part of foot ([place])   . Bilateral primary osteoarthritis of knee   . CAD (coronary artery disease)   . Calculus of ureter   . CHF (congestive heart failure) ([place])   . COPD (chronic obstructive pulmonary disease) ([place])   . Depression   . Displaced spiral fracture of shaft of left femur, subsequent encounter for closed fracture with routine healing   . Essential hypertension   . GERD (gastroesophageal reflux disease)   . Hepatic cirrhosis ([place])   . Hyperlipidemia   .  LBBB (left bundle branch block)   . Melanocytic nevi, unspecified   . OSA (obstructive sleep apnea)   . PAF (paroxysmal atrial fibrillation) ([place])   . Type 2 diabetes mellitus ([place])   . Vitamin D deficiency     Past Surgical History:  Procedure Laterality Date  . CHOLECYSTECTOMY    . SPINE SURGERY    . STRABISMUS SURGERY        HPI  from the history and physical done on the day of admission:    Patient Coming From: [place] home  Chief Complaint: AMS  HPI: Heather Newman is a 79 y.o. female with a history of dementia, coronary artery disease, COPD, GERD, hypertension, hepatic cirrhosis, atrial fibrillation.   Patient seen for increasing altered mental status.  Patient is a resident of [place] home..  Patient is oriented x4 at baseline, but now oriented to person.  She is also had diminished oral food and liquid intake.  She was brought to the hospital for evaluation, especially given her history of hepatic cirrhosis.  I spoke with the patient's granddaughter [name], who is the patient's closest living relative.  Patient does not have an official POA.  She also does not have a GI doctor, as she recently moved down to [place] from [place] getting of the year.  Emergency Department Course: Ammonia level 124.  All other labs fairly normal.  UA normal     Hospital Course:    BriefSummary 79 year old with dementia,COPD/bronchiectasisHTN, DM, chronic hypoxic respiratory failure on 2 L of oxygen chronically, NASH liver cirrhosis,chronic atrial fibrillation not on anticoagulation, admitted on 0000000 from [facility] with concerns about hepatic encephalopathy in the setting of elevated ammonia -Resolved HE with lactulose and BMs  A/p 1)Hepatic Encephalopathy--on admission ammonia was up to 124,   -Received lactulose enema and p.o. lactulose ammonia is down to 59 -Patient is AAO x 3 -GI input appreciated -Started on rifaximin by Dr. Laural Golden -Patient encouraged to be compliant with lactulose -Patient case low potassium and low magnesium with BMs -[facility] to check CMP and CBC q Thursday x 2 weeks   2)H/oNASHLiver Cirrhosis---questions about compliance with lactulose =-goal is to have 2 to 3 mushy soft stools per day, avoid constipation, avoid watery diarrhea type stools -Encephalopathy resolved as above #1 -Decreased Remeron to 7.5 mg nightly from 50 mg nightly -Decreased gabapentin to 20 mg twice daily from 300 mg twice daily -Continue rifaximin and lactulose -INR is 1.1, Albumin is 2.3,  LFTs WNL, except for T bil of 2.0  -Alpha-fetoprotein is WNL at 3.6    3)Leukocytosis   --- resolved, WBC down to 10.0 from WBC 17, UA on admission not suspicious for UTI -Chest x-ray not suggestive of pneumonia -Blood cultures NGTD -Suspect this was reactive leukocytosis  4)DM2-A1c 8.0, reflecting uncontrolled DM PTA,  -Patient was on Lantus 10 units twice daily -Resume Metformin use Novolog/Humalog Sliding scale insulin with Accu-Cheks/Fingersticks as ordered  5)ParoxysmalA. fib with RVR--- -continue Cardizem CD 240 mg daily, -Continue Toprol-XL at 50 mg daily patient was not on anticoagulation PTAdue to bleeding concerns -Recent echo with EF of 55%  6)Dementia--withsome baseline memory andcognitive deficits--cognitive abilities improved with resolution of hepatic encephalopathy -Remeron dosage decreased to 7.5 mg nightly   7)CAD--stable,chest pain-free, continue aspirin and-Continue Toprol-XL 50 mg daily -Consider discontinuation of Crestor 5 mg  8)COPD/emphysema--stable, continue Dulera andas needed neb treatments  9)buttock rash---suspect Candida/yeast---Mycolog cream twice daily as ordered  10)Social/Ethics-patient is a  DNR/DNI/partial code -Okay to  use BiPAP, shock and ACLS medications -However no CPR and no intubation -Patient's Primary contact is her granddaughter Ms. [name] ---sinceMariJois not a relativeand Not anofficial POA---and givendifficulties with communication with MariJo wewill limit conversations with MariJo  --- Will continue to haveconversations with patient's granddaughterKerrie -Please see subjective section above    Code Status: Partial Family Communication:  Bedside conference today patient's granddaughter Ms. Thomes Dinning see subjective section above  Consults : GI  Discharge Condition: Stable  Follow UP--[facility]P at [facility]  Diet and Activity recommendation:  As advised  Discharge Instructions    Discharge Instructions    Call MD for:  difficulty  breathing, headache or visual disturbances   Complete by: As directed    Call MD for:  persistant dizziness or light-headedness   Complete by: As directed    Call MD for:  persistant nausea and vomiting   Complete by: As directed    Call MD for:  severe uncontrolled pain   Complete by: As directed    Call MD for:  temperature >100.4   Complete by: As directed    Diet - low sodium heart healthy   Complete by: As directed    Diet Carb Modified   Complete by: As directed    Discharge instructions   Complete by: As directed    1)Please Change Lactulose to 30 mL (20gm) twice daily --- Goal is to have 2-3 mushy stools per day- avoid constipation and avoid frank watery diarrhea type stools----  2)If stools become too watery and too frequent please adjust lactulose dosage and please give additional potassium supplementation  3)Please Start rifaximin (XIFAXAN) tablet 550 mg twice daily in an attempt to reduce the frequency and severity of confusion associated with hepatic encephalopathy (reduce frequency and severity of episodes) elevated ammonia levels  4) sliding scale insulin as ordered ---insulin aspart (novoLOG) injection 0- 6 Units 0- 6 Units Subcutaneous, 3 times daily with meals CBG < 70: Implement Hypoglycemia Standing Orders, -- CBG 70 - 120: 0 unit CBG 121 - 150: 0 unit  CBG 151 - 200: 0 unit CBG 201 - 250: 1 units CBG 251 - 300: 2 units CBG 301 - 350: 4 units  CBG 351 - 400: 6 units  CBG > 400: 6 units  5)Please repeat CBC and CMP test every Thursday for the next 2 weeks starting on Thursday, 08/23/2019  6)Please note that Mirtazapine/Remeron and Gabapentin dosages have been adjusted  7)Please Avoid ibuprofen/Advil/Aleve/Motrin/Goody Powders/Naproxen/BC powders/Meloxicam/Diclofenac/Indomethacin and other Nonsteroidal anti-inflammatory medications as these will make you more likely to bleed and can cause stomach ulcers, can also cause Kidney problems.   8)Patient's Primary contact  is her granddaughter Ms. [name]----[phone]  9)Patient's family/granddaughter [name] like to have outpatient orthopedic evaluation for patient due to concerns about lumbar/back pain and ambulatory dysfunction  10)Patient is yet to complete her Covid vaccination--- please facilitate administration of the second dose of Covid vaccine to patient   Increase activity slowly   Complete by: As directed        Discharge Medications     Allergies as of 08/20/2019   No Known Allergies     Medication List    TAKE these medications   acetaminophen 325 MG tablet Commonly known as: TYLENOL Take 325-650 mg by mouth every 6 (six) hours as needed for mild pain or moderate pain.   ADMELOG SOLOSTAR [place] Inject 4 Units into the skin 3 (three) times daily before meals.   albuterol (2.5 MG/3ML) 0.083%  nebulizer solution Commonly known as: PROVENTIL Take 3 mLs (2.5 mg total) by nebulization every 6 (six) hours as needed for wheezing or shortness of breath.   aspirin 81 MG EC tablet Take 1 tablet (81 mg total) by mouth daily with breakfast. Start taking on: Aug 21, 2019 What changed: additional instructions   Basaglar KwikPen 100 UNIT/ML Inject 10 Units into the skin daily.   diltiazem 240 MG 24 hr capsule Commonly known as: CARDIZEM CD Take 1 capsule (240 mg total) by mouth daily. For Heart   famotidine 20 MG tablet Commonly known as: PEPCID Take 1 tablet (20 mg total) by mouth 2 (two) times daily.   fluticasone 50 MCG/ACT nasal spray Commonly known as: FLONASE Place 1 spray into both nostrils daily.   Fluticasone-Salmeterol 100-50 MCG/DOSE Aepb Commonly known as: ADVAIR Inhale 1 puff into the lungs 2 (two) times daily.   furosemide 20 MG tablet Commonly known as: LASIX Take 1 tablet (20 mg total) by mouth daily.   gabapentin 100 MG capsule Commonly known as: NEURONTIN Take 2 capsules (200 mg total) by mouth 2 (two) times daily. What changed:    medication strength  how much to take   guaiFENesin 600 MG 12 hr tablet Commonly known as: MUCINEX Take 1 tablet (600 mg total) by mouth 2 (two) times daily.   hydrocortisone 2.5 % rectal cream Commonly known as: ANUSOL-HC Place rectally 2 (two) times daily for 10 days. Use for 10 days then okay to stop What changed:   when to take this  additional instructions   insulin aspart 100 UNIT/ML FlexPen Commonly known as: NOVOLOG Inject 0-6 Units into the skin See admin instructions. insulin aspart (novoLOG) injection 0- 6 Units 0- 6 Units Subcutaneous, 3 times daily with meals CBG < 70: Implement Hypoglycemia Standing Orders, -- CBG 70 - 120: 0 unit CBG 121 - 150: 0 unit  CBG 151 - 200: 0 unit CBG 201 - 250: 1 units CBG 251 - 300: 2 units CBG 301 - 350: 4 units  CBG 351 - 400: 6 units  CBG > 400: 6 units   ipratropium-albuterol 0.5-2.5 (3) MG/3ML Soln Commonly known as: DUONEB Take 3 mLs by nebulization 2 (two) times daily.   lactulose 10 GM/15ML solution Commonly known as: CHRONULAC Take 30 mLs (20 g total) by mouth 2 (two) times daily. Goal is to have 2-3 mushy stools per day-avoid constipation and avoid frank watery diarrhea type stool What changed: when to take this   meclizine 25 MG tablet Commonly known as: ANTIVERT Take 25 mg by mouth 3 (three) times daily as needed for dizziness.   metFORMIN 500 MG tablet Commonly known as: GLUCOPHAGE Take 1 tablet (500 mg total) by mouth 2 (two) times daily with a meal.   metoprolol succinate 50 MG 24 hr tablet Commonly known as: Toprol XL Take 1 tablet (50 mg total) by mouth daily.   mirtazapine 7.5 MG tablet Commonly known as: REMERON Take 1 tablet (7.5 mg total) by mouth at bedtime. What changed:   medication strength  how much to take   nystatin powder Commonly known as: MYCOSTATIN/NYSTOP Apply topically 3 (three) times daily. Apply to affected areas---Groin and perineum   nystatin-triamcinolone cream Commonly known as:  MYCOLOG II Apply 1 application topically 2 (two) times daily. Apply to buttocks--rash   Potassium Chloride ER 20 MEQ Tbcr Take 20 mEq by mouth every Tuesday, Thursday, Saturday, and Sunday. -take potassium supplements while having BMs with Lactulose Start taking on: Aug 21, 2019   rifaximin 550 MG Tabs tablet Commonly known as: XIFAXAN Take 1 tablet (550 mg total) by mouth 2 (two) times daily.   rosuvastatin 5 MG tablet Commonly known as: CRESTOR Take 5 mg by mouth daily.   traMADol 50 MG tablet Commonly known as: ULTRAM Take 1 tablet (50 mg total) by mouth 2 (two) times daily as needed for moderate pain. What changed: reasons to take this   Vitamin D (Ergocalciferol) 1.25 MG (50000 UNIT) Caps capsule Commonly known as: DRISDOL Take 1 capsule (50,000 Units total) by mouth every 7 (seven) days. What changed: when to take this      Major procedures and Radiology Reports - PLEASE review detailed and final reports for all details, in brief -   DG Chest Port 1 View  Result Date: 08/17/2019 CLINICAL DATA:  Weakness and elevated ammonia level EXAM: PORTABLE CHEST 1 VIEW COMPARISON:  08/04/2019 FINDINGS: Cardiac shadow is enlarged but stable. Aortic calcifications are again seen. The lungs are well aerated bilaterally. Mild vascular congestion is again noted centrally. No edema is seen. No focal infiltrate is noted. Mild scarring at the left base is noted. IMPRESSION: Chronic changes without acute abnormality. Electronically Signed   By: Inez Catalina M.D.   On: 08/17/2019 15:39   DG Chest Port 1 View  Result Date: 08/04/2019 CLINICAL DATA:  Fever, rectal bleeding EXAM: PORTABLE CHEST 1 VIEW COMPARISON:  07/15/2019 FINDINGS: Single frontal view of the chest demonstrates an enlarged cardiac silhouette, stable. Chronic central vascular congestion without airspace disease, effusion, or pneumothorax. No acute bony abnormalities. IMPRESSION: 1. Stable chest, no acute process. Electronically  Signed   By: Randa Ngo M.D.   On: 08/04/2019 03:35   US Abdomen Limited RUQ  Result Date: 08/18/2019 CLINICAL DATA:  Cirrhosis of the liver. EXAM: ULTRASOUND ABDOMEN LIMITED RIGHT UPPER QUADRANT COMPARISON:  None. FINDINGS: Gallbladder: Post cholecystectomy.  No complicating features. Common bile duct: Diameter: 8.7 mm, partially obscured by bowel gas. Liver: Heterogeneous increased echogenicity with lobulated borders and atrophic appearance. Portal vein is patent on color Doppler imaging with normal direction of blood flow towards the liver. 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 Other: None. 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 Normal direction of flow of the main portal vein. Electronically Signed   By: Fidela Salisbury M.D.   On: 08/18/2019 12:52    Micro Results  Recent Results (from the past 240 hour(s))  SARS Coronavirus 2 by RT PCR (hospital order, performed in [facility] hospital lab) Nasopharyngeal Nasopharyngeal Swab     Status: None   Collection Time: 08/17/19  6:00 PM   Specimen: Nasopharyngeal Swab  Result Value Ref Range Status   SARS Coronavirus 2 NEGATIVE NEGATIVE Final    Comment: (NOTE) SARS-CoV-2 target nucleic acids are NOT DETECTED. The SARS-CoV-2 RNA is generally detectable in upper and lower respiratory specimens during the acute phase of infection. The lowest concentration of SARS-CoV-2 viral copies this assay can detect is 250 copies / mL. A negative result does not preclude SARS-CoV-2 infection and should not be used as the sole basis for treatment or other patient management decisions.  A negative result may occur with improper specimen collection / handling, submission of specimen other than nasopharyngeal swab, presence of viral mutation(s) within the areas targeted by this assay, and inadequate number of viral copies (<250 copies / mL). A negative result must be  combined with clinical observations,  patient history, and epidemiological information. Fact Sheet for Patients:   StrictlyIdeas.no Fact Sheet for Healthcare Providers: BankingDealers.co.za This test is not yet approved or cleared  by the [place] FDA and has been authorized for detection and/or diagnosis of SARS-CoV-2 by FDA under an Emergency Use Authorization (EUA).  This EUA will remain in effect (meaning this test can be used) for the duration of the COVID-19 declaration under Section 564(b)(1) of the Act, 21 U.S.C. section 360bbb-3(b)(1), unless the authorization is terminated or revoked sooner. Performed at [facility], [address], [place], [place] [address]   Culture, blood (Routine X 2) w Reflex to ID Panel     Status: None (Preliminary result)   Collection Time: 08/18/19  5:47 PM   Specimen: BLOOD RIGHT FOREARM  Result Value Ref Range Status   Specimen Description BLOOD RIGHT FOREARM  Final   Special Requests   Final    BOTTLES DRAWN AEROBIC AND ANAEROBIC Blood Culture adequate volume   Culture   Final    NO GROWTH 2 DAYS Performed at [facility], [address], [place], [place] [address]    Report Status PENDING  Incomplete  Culture, blood (Routine X 2) w Reflex to ID Panel     Status: None (Preliminary result)   Collection Time: 08/18/19  5:48 PM   Specimen: BLOOD LEFT HAND  Result Value Ref Range Status   Specimen Description BLOOD LEFT HAND  Final   Special Requests   Final    BOTTLES DRAWN AEROBIC ONLY Blood Culture adequate volume   Culture   Final    NO GROWTH 2 DAYS Performed at [facility], [address], [place], [place] [address]    Report Status PENDING  Incomplete    Today   Subjective    Dorlisa Delphia today has no new complaints  Eating ok          Patient is  coherent, awake and alert x3  Care Conference at pts bedside with [facility] Tiffany Volger at bedside and  patient's granddaughter  Ms. Esmond Harps after at bedside    -Plan of care, medication adjustments, discharge recommendations and instructions discussed -Questions answered -Patient being discharged to [facility] on 0000000   Patient has been seen and examined prior to discharge   Objective   Blood pressure 93/62, pulse 64, temperature 98.5 F (36.9 C), temperature source Oral, resp. rate 18, weight 78.1 kg, SpO2 97 %.   Intake/Output Summary (Last 24 hours) at 08/20/2019 1447 Last data filed at 08/20/2019 1050 Gross per 24 hour  Intake 480 ml  Output 200 ml  Net 280 ml    Exam Gen:- Awake Alert, no acute distress   HEENT:- [place].AT, No sclera icterus Neck-Supple Neck,No JVD,.  Lungs-  CTAB , fair symmetrical air movement CV- S1, S2 normal, irregular  Abd-  +ve B.Sounds, Abd Soft, No tenderness,    Extremity:- No  edema, pedal pulses present  Psych- some baseline cognitive and memory deficits, patient more cooperative and more alert  neuro-generalized weakness, no new focal deficits, no tremors, speech defect is not new Skin--erythematous rash around her buttocks with satellite lesions suggestive of Candida intertrigo   Data Review   CBC w Diff:  Lab Results  Component Value Date   WBC 10.0 08/20/2019   HGB 10.8 (L) 08/20/2019   HCT 33.9 (L) 08/20/2019   PLT 281 08/20/2019   LYMPHOPCT 19 08/17/2019   MONOPCT 13 08/17/2019   EOSPCT 2 08/17/2019   BASOPCT 1  08/17/2019    CMP:  Lab Results  Component Value Date   NA 139 08/20/2019   K 2.9 (L) 08/20/2019   CL 99 08/20/2019   CO2 30 08/20/2019   BUN 12 08/20/2019   CREATININE 0.44 08/20/2019   CREATININE 0.57 (L) 03/28/2019   PROT 5.3 (L) 08/20/2019   ALBUMIN 2.3 (L) 08/20/2019   BILITOT 2.0 (H) 08/20/2019   ALKPHOS 79 08/20/2019   AST 17 08/20/2019   ALT 13 08/20/2019  .   Total Discharge time is about 33 minutes  Roxan Hockey M.D on 08/20/2019 at 2:47 PM  Go to [email or web address] -  for contact info  [facility] -  Office  [phone]

## 2019-08-20 NOTE — Care Management Important Message (Signed)
Important Message  Patient Details  Name: Heather Newman MRN: [id number] Date of Birth: March 04, 1941   Medicare Important Message Given:  Yes     Tommy Medal 08/20/2019, 2:37 PM

## 2019-08-20 NOTE — Plan of Care (Signed)

## 2019-08-20 NOTE — Discharge Instructions (Signed)
1)Please Change Lactulose to 30 mL (20gm) twice daily --- Goal is to have 2-3 mushy stools per day- avoid constipation and avoid frank watery diarrhea type stools----  2)If stools become too watery and too frequent please adjust lactulose dosage and please give additional potassium supplementation  3)Please Start rifaximin (XIFAXAN) tablet 550 mg twice daily in an attempt to reduce the frequency and severity of confusion associated with hepatic encephalopathy (reduce frequency and severity of episodes) elevated ammonia levels  4) sliding scale insulin as ordered ---insulin aspart (novoLOG) injection 0- 6 Units 0- 6 Units Subcutaneous, 3 times daily with meals CBG < 70: Implement Hypoglycemia Standing Orders, -- CBG 70 - 120: 0 unit CBG 121 - 150: 0 unit  CBG 151 - 200: 0 unit CBG 201 - 250: 1 units CBG 251 - 300: 2 units CBG 301 - 350: 4 units  CBG 351 - 400: 6 units  CBG > 400: 6 units  5)Please repeat CBC and CMP test every Thursday for the next 2 weeks starting on Thursday, 08/23/2019  6)Please note that Mirtazapine/Remeron and Gabapentin dosages have been adjusted  7)Please Avoid ibuprofen/Advil/Aleve/Motrin/Goody Powders/Naproxen/BC powders/Meloxicam/Diclofenac/Indomethacin and other Nonsteroidal anti-inflammatory medications as these will make you more likely to bleed and can cause stomach ulcers, can also cause Kidney problems.   8)Patient's Primary contact is her granddaughter Ms. [name]----[phone]  9)Patient's family/granddaughter [name] like to have outpatient orthopedic evaluation for patient due to concerns about lumbar/back pain and ambulatory dysfunction  10)Patient is yet to complete her Covid vaccination--- please facilitate administration of the second dose of Covid vaccine to patient

## 2019-08-20 NOTE — TOC Transition Note (Addendum)
Transition of Care [facility]) - CM/SW Discharge Note   Patient Details  Name: Mayleigh Halsey MRN: [id number] Date of Birth: October 03, 1940  Transition of Care [facility]) CM/SW Contact:  Shade Flood, LCSW Phone Number: 08/20/2019, 12:11 PM   Clinical Narrative:     Pt admitted from [facility] rehab. Pt was hospitalized at [facility] two weeks ago and returned to [facility] at that time as well. Per MD, pt stable for dc today. Updated Debbie at [facility]. Pt can return to Room B14 today. RN can call report to [phone] ask for [place]. Will print EMS transport form to the floor.   Will update RN and family. There are no other TOC needs for dc.  1440: Received notice from [facility] CMA that pt's family had expressed concern about pt returning to [place]. Attempted to call family but could only leave VMM. Spoke with MD who states that he and [facility] had long discussion with pt and her granddaughter and that they expressed agreement to returning to [facility]. MD indicates pt should be discharged to return to [facility] today.  Final next level of care: Skilled Nursing Facility Barriers to Discharge: Barriers Resolved   Patient Goals and CMS Choice        Discharge Placement                       Discharge Plan and Services In-house Referral: Clinical Social Work                                   Social Determinants of Health (SDOH) Interventions     Readmission Risk Interventions No flowsheet data found.

## 2019-08-23 LAB — CULTURE, BLOOD (ROUTINE X 2)
Culture: NO GROWTH
Culture: NO GROWTH
Special Requests: ADEQUATE
Special Requests: ADEQUATE

## 2019-08-25 ENCOUNTER — Inpatient Hospital Stay (HOSPITAL_COMMUNITY)
Admission: EM | Admit: 2019-08-25 | Discharge: 2019-08-27 | DRG: 441 | Disposition: A | Discharge status: Hospice | Payer: Medicare Other | Source: Skilled Nursing Facility | Attending: Family Medicine | Admitting: Family Medicine

## 2019-08-25 ENCOUNTER — Other Ambulatory Visit: Payer: Self-pay

## 2019-08-25 ENCOUNTER — Encounter (HOSPITAL_COMMUNITY): Payer: Self-pay

## 2019-08-25 DIAGNOSIS — F17210 Nicotine dependence, cigarettes, uncomplicated: Secondary | ICD-10-CM | POA: Diagnosis present

## 2019-08-25 DIAGNOSIS — L89612 Pressure ulcer of right heel, stage 2: Secondary | ICD-10-CM | POA: Diagnosis not present

## 2019-08-25 DIAGNOSIS — I639 Cerebral infarction, unspecified: Secondary | ICD-10-CM | POA: Clinically undetermined

## 2019-08-25 DIAGNOSIS — E118 Type 2 diabetes mellitus with unspecified complications: Secondary | ICD-10-CM | POA: Diagnosis present

## 2019-08-25 DIAGNOSIS — J439 Emphysema, unspecified: Secondary | ICD-10-CM | POA: Diagnosis present

## 2019-08-25 DIAGNOSIS — K7581 Nonalcoholic steatohepatitis (NASH): Secondary | ICD-10-CM | POA: Diagnosis present

## 2019-08-25 DIAGNOSIS — K7290 Hepatic failure, unspecified without coma: Secondary | ICD-10-CM | POA: Diagnosis present

## 2019-08-25 DIAGNOSIS — L89602 Pressure ulcer of unspecified heel, stage 2: Secondary | ICD-10-CM | POA: Diagnosis present

## 2019-08-25 DIAGNOSIS — I2510 Atherosclerotic heart disease of native coronary artery without angina pectoris: Secondary | ICD-10-CM | POA: Diagnosis not present

## 2019-08-25 DIAGNOSIS — G309 Alzheimer's disease, unspecified: Secondary | ICD-10-CM | POA: Diagnosis present

## 2019-08-25 DIAGNOSIS — M170 Bilateral primary osteoarthritis of knee: Secondary | ICD-10-CM | POA: Diagnosis present

## 2019-08-25 DIAGNOSIS — I5022 Chronic systolic (congestive) heart failure: Secondary | ICD-10-CM | POA: Diagnosis present

## 2019-08-25 DIAGNOSIS — I69391 Dysphagia following cerebral infarction: Secondary | ICD-10-CM

## 2019-08-25 DIAGNOSIS — E1151 Type 2 diabetes mellitus with diabetic peripheral angiopathy without gangrene: Secondary | ICD-10-CM | POA: Diagnosis present

## 2019-08-25 DIAGNOSIS — Z66 Do not resuscitate: Secondary | ICD-10-CM | POA: Diagnosis present

## 2019-08-25 DIAGNOSIS — J9601 Acute respiratory failure with hypoxia: Secondary | ICD-10-CM | POA: Diagnosis not present

## 2019-08-25 DIAGNOSIS — Z8719 Personal history of other diseases of the digestive system: Secondary | ICD-10-CM

## 2019-08-25 DIAGNOSIS — I10 Essential (primary) hypertension: Secondary | ICD-10-CM | POA: Diagnosis present

## 2019-08-25 DIAGNOSIS — R4701 Aphasia: Secondary | ICD-10-CM | POA: Diagnosis not present

## 2019-08-25 DIAGNOSIS — G92 Toxic encephalopathy: Secondary | ICD-10-CM | POA: Diagnosis not present

## 2019-08-25 DIAGNOSIS — Z20822 Contact with and (suspected) exposure to covid-19: Secondary | ICD-10-CM | POA: Diagnosis present

## 2019-08-25 DIAGNOSIS — E7220 Disorder of urea cycle metabolism, unspecified: Secondary | ICD-10-CM | POA: Diagnosis present

## 2019-08-25 DIAGNOSIS — L89622 Pressure ulcer of left heel, stage 2: Secondary | ICD-10-CM | POA: Diagnosis present

## 2019-08-25 DIAGNOSIS — Z79899 Other long term (current) drug therapy: Secondary | ICD-10-CM

## 2019-08-25 DIAGNOSIS — G4722 Circadian rhythm sleep disorder, advanced sleep phase type: Secondary | ICD-10-CM | POA: Diagnosis present

## 2019-08-25 DIAGNOSIS — Z7982 Long term (current) use of aspirin: Secondary | ICD-10-CM

## 2019-08-25 DIAGNOSIS — I110 Hypertensive heart disease with heart failure: Secondary | ICD-10-CM | POA: Diagnosis present

## 2019-08-25 DIAGNOSIS — F0280 Dementia in other diseases classified elsewhere without behavioral disturbance: Secondary | ICD-10-CM | POA: Diagnosis present

## 2019-08-25 DIAGNOSIS — Z515 Encounter for palliative care: Secondary | ICD-10-CM | POA: Diagnosis not present

## 2019-08-25 DIAGNOSIS — R1310 Dysphagia, unspecified: Secondary | ICD-10-CM | POA: Diagnosis not present

## 2019-08-25 DIAGNOSIS — R4182 Altered mental status, unspecified: Secondary | ICD-10-CM | POA: Diagnosis present

## 2019-08-25 DIAGNOSIS — G8194 Hemiplegia, unspecified affecting left nondominant side: Secondary | ICD-10-CM | POA: Diagnosis not present

## 2019-08-25 DIAGNOSIS — I25118 Atherosclerotic heart disease of native coronary artery with other forms of angina pectoris: Secondary | ICD-10-CM | POA: Diagnosis present

## 2019-08-25 DIAGNOSIS — R29810 Facial weakness: Secondary | ICD-10-CM | POA: Diagnosis not present

## 2019-08-25 DIAGNOSIS — K7469 Other cirrhosis of liver: Secondary | ICD-10-CM | POA: Diagnosis present

## 2019-08-25 DIAGNOSIS — Z833 Family history of diabetes mellitus: Secondary | ICD-10-CM

## 2019-08-25 DIAGNOSIS — R5381 Other malaise: Secondary | ICD-10-CM | POA: Diagnosis present

## 2019-08-25 DIAGNOSIS — K7682 Hepatic encephalopathy: Secondary | ICD-10-CM

## 2019-08-25 DIAGNOSIS — E785 Hyperlipidemia, unspecified: Secondary | ICD-10-CM | POA: Diagnosis present

## 2019-08-25 DIAGNOSIS — R29728 NIHSS score 28: Secondary | ICD-10-CM | POA: Diagnosis present

## 2019-08-25 DIAGNOSIS — G9341 Metabolic encephalopathy: Secondary | ICD-10-CM | POA: Diagnosis present

## 2019-08-25 DIAGNOSIS — I482 Chronic atrial fibrillation, unspecified: Secondary | ICD-10-CM | POA: Diagnosis present

## 2019-08-25 DIAGNOSIS — G4733 Obstructive sleep apnea (adult) (pediatric): Secondary | ICD-10-CM | POA: Diagnosis present

## 2019-08-25 DIAGNOSIS — F329 Major depressive disorder, single episode, unspecified: Secondary | ICD-10-CM | POA: Diagnosis present

## 2019-08-25 DIAGNOSIS — Z794 Long term (current) use of insulin: Secondary | ICD-10-CM

## 2019-08-25 DIAGNOSIS — R0602 Shortness of breath: Secondary | ICD-10-CM

## 2019-08-25 DIAGNOSIS — K219 Gastro-esophageal reflux disease without esophagitis: Secondary | ICD-10-CM | POA: Diagnosis present

## 2019-08-25 DIAGNOSIS — Z993 Dependence on wheelchair: Secondary | ICD-10-CM

## 2019-08-25 LAB — CBC
HCT: 44.7 % (ref 36.0–46.0)
Hemoglobin: 14.2 g/dL (ref 12.0–15.0)
MCH: 31.9 pg (ref 26.0–34.0)
MCHC: 31.8 g/dL (ref 30.0–36.0)
MCV: 100.4 fL — ABNORMAL HIGH (ref 80.0–100.0)
Platelets: 421 10*3/uL — ABNORMAL HIGH (ref 150–400)
RBC: 4.45 MIL/uL (ref 3.87–5.11)
RDW: 14.6 % (ref 11.5–15.5)
WBC: 9.2 10*3/uL (ref 4.0–10.5)
nRBC: 0 % (ref 0.0–0.2)

## 2019-08-25 LAB — COMPREHENSIVE METABOLIC PANEL
ALT: 19 U/L (ref 0–44)
AST: 27 U/L (ref 15–41)
Albumin: 3.1 g/dL — ABNORMAL LOW (ref 3.5–5.0)
Alkaline Phosphatase: 115 U/L (ref 38–126)
Anion gap: 10 (ref 5–15)
BUN: 20 mg/dL (ref 8–23)
CO2: 30 mmol/L (ref 22–32)
Calcium: 9.5 mg/dL (ref 8.9–10.3)
Chloride: 101 mmol/L (ref 98–111)
Creatinine, Ser: 0.63 mg/dL (ref 0.44–1.00)
GFR calc Af Amer: >60 mL/min (ref >60)
GFR calc non Af Amer: >60 mL/min (ref >60)
Glucose, Bld: 148 mg/dL — ABNORMAL HIGH (ref 70–99)
Potassium: 4.3 mmol/L (ref 3.5–5.1)
Sodium: 141 mmol/L (ref 135–145)
Total Bilirubin: 1.3 mg/dL — ABNORMAL HIGH (ref 0.3–1.2)
Total Protein: 6.8 g/dL (ref 6.5–8.1)

## 2019-08-25 LAB — GLUCOSE, CAPILLARY
Glucose-Capillary: 138 mg/dL — ABNORMAL HIGH (ref 70–99)
Glucose-Capillary: 169 mg/dL — ABNORMAL HIGH (ref 70–99)

## 2019-08-25 LAB — AMMONIA: Ammonia: 152 umol/L — ABNORMAL HIGH (ref 9–35)

## 2019-08-25 LAB — TSH: TSH: 1.768 u[IU]/mL (ref 0.350–4.500)

## 2019-08-25 LAB — SARS CORONAVIRUS 2 BY RT PCR (HOSPITAL ORDER, PERFORMED IN ~~LOC~~ HOSPITAL LAB): SARS Coronavirus 2: NEGATIVE

## 2019-08-25 MED ORDER — RIFAXIMIN 550 MG PO TABS: 550.0000 mg | ORAL_TABLET | Freq: Two times a day (BID) | ORAL | Status: DC

## 2019-08-25 MED ORDER — INSULIN ASPART 100 UNIT/ML ~~LOC~~ SOLN
0.0000 [IU] | Freq: Three times a day (TID) | SUBCUTANEOUS | Status: DC
  Administered 2019-08-25: 1 [IU] via SUBCUTANEOUS
  Administered 2019-08-26 (×2): 2 [IU] via SUBCUTANEOUS

## 2019-08-25 MED ORDER — MIRTAZAPINE 15 MG PO TABS: 7.5000 mg | ORAL_TABLET | Freq: Every day | ORAL | Status: DC

## 2019-08-25 MED ORDER — FLUTICASONE PROPIONATE 50 MCG/ACT NA SUSP
1.0000 | Freq: Every day | NASAL | Status: DC
  Filled 2019-08-25: qty 16

## 2019-08-25 MED ORDER — IPRATROPIUM-ALBUTEROL 0.5-2.5 (3) MG/3ML IN SOLN
3.0000 mL | Freq: Two times a day (BID) | RESPIRATORY_TRACT | Status: DC
  Administered 2019-08-25 – 2019-08-26 (×2): 3 mL via RESPIRATORY_TRACT
  Filled 2019-08-25 (×2): qty 3

## 2019-08-25 MED ORDER — FUROSEMIDE 20 MG PO TABS: 20.0000 mg | ORAL_TABLET | Freq: Every day | ORAL | Status: DC

## 2019-08-25 MED ORDER — INSULIN GLARGINE 100 UNIT/ML ~~LOC~~ SOLN
10.0000 [IU] | Freq: Every day | SUBCUTANEOUS | Status: DC
  Filled 2019-08-25: qty 0.1

## 2019-08-25 MED ORDER — FAMOTIDINE 20 MG PO TABS
20.0000 mg | ORAL_TABLET | Freq: Two times a day (BID) | ORAL | Status: DC
  Filled 2019-08-25: qty 1

## 2019-08-25 MED ORDER — LACTULOSE 10 GM/15ML PO SOLN: 20.0000 g | Freq: Three times a day (TID) | ORAL | Status: DC

## 2019-08-25 MED ORDER — ALBUTEROL SULFATE (2.5 MG/3ML) 0.083% IN NEBU: 2.5000 mg | INHALATION_SOLUTION | Freq: Four times a day (QID) | RESPIRATORY_TRACT | Status: DC | PRN

## 2019-08-25 MED ORDER — ONDANSETRON HCL 4 MG/2ML IJ SOLN: 4.0000 mg | Freq: Four times a day (QID) | INTRAMUSCULAR | Status: DC | PRN

## 2019-08-25 MED ORDER — INSULIN GLARGINE 100 UNIT/ML ~~LOC~~ SOLN
10.0000 [IU] | Freq: Every day | SUBCUTANEOUS | Status: DC
  Filled 2019-08-25 (×3): qty 0.1

## 2019-08-25 MED ORDER — FLUTICASONE FUROATE-VILANTEROL 100-25 MCG/INH IN AEPB
1.0000 | INHALATION_SPRAY | Freq: Every day | RESPIRATORY_TRACT | Status: DC
  Filled 2019-08-25: qty 28

## 2019-08-25 MED ORDER — DILTIAZEM HCL ER COATED BEADS 120 MG PO CP24: 240.0000 mg | ORAL_CAPSULE | Freq: Every day | ORAL | Status: DC

## 2019-08-25 MED ORDER — ROSUVASTATIN CALCIUM 10 MG PO TABS: 5.0000 mg | ORAL_TABLET | Freq: Every evening | ORAL | Status: DC

## 2019-08-25 MED ORDER — HEPARIN SODIUM (PORCINE) 5000 UNIT/ML IJ SOLN
5000.0000 [IU] | Freq: Three times a day (TID) | INTRAMUSCULAR | Status: DC
  Administered 2019-08-25 – 2019-08-26 (×2): 5000 [IU] via SUBCUTANEOUS
  Filled 2019-08-25 (×2): qty 1

## 2019-08-25 MED ORDER — TRAMADOL HCL 50 MG PO TABS: 50.0000 mg | ORAL_TABLET | Freq: Two times a day (BID) | ORAL | Status: DC | PRN

## 2019-08-25 MED ORDER — SODIUM CHLORIDE 0.9 % IV SOLN: INTRAVENOUS | Status: DC

## 2019-08-25 MED ORDER — ONDANSETRON HCL 4 MG PO TABS: 4.0000 mg | ORAL_TABLET | Freq: Four times a day (QID) | ORAL | Status: DC | PRN

## 2019-08-25 MED ORDER — VITAMIN D (ERGOCALCIFEROL) 1.25 MG (50000 UNIT) PO CAPS: 50000.0000 [IU] | ORAL_CAPSULE | ORAL | Status: DC

## 2019-08-25 MED ORDER — SODIUM CHLORIDE 0.9 % IV SOLN: Freq: Once | INTRAVENOUS | Status: AC

## 2019-08-25 MED ORDER — GABAPENTIN 100 MG PO CAPS: 200.0000 mg | ORAL_CAPSULE | Freq: Two times a day (BID) | ORAL | Status: DC | PRN

## 2019-08-25 MED ORDER — ASPIRIN EC 81 MG PO TBEC: 81.0000 mg | DELAYED_RELEASE_TABLET | Freq: Every day | ORAL | Status: DC

## 2019-08-25 MED ORDER — ROSUVASTATIN CALCIUM 5 MG PO TABS: 5.0000 mg | ORAL_TABLET | Freq: Every evening | ORAL | Status: DC

## 2019-08-25 MED ORDER — LACTULOSE ENEMA
300.0000 mL | Freq: Once | ORAL | Status: AC
  Administered 2019-08-25: 300 mL via RECTAL
  Filled 2019-08-25: qty 300

## 2019-08-25 MED ORDER — LACTULOSE ENEMA
300.0000 mL | Freq: Three times a day (TID) | ORAL | Status: DC
  Administered 2019-08-25 – 2019-08-26 (×2): 300 mL via RECTAL
  Filled 2019-08-25 (×7): qty 300

## 2019-08-25 MED ORDER — NYSTATIN 100000 UNIT/GM EX POWD
Freq: Three times a day (TID) | CUTANEOUS | Status: DC
  Filled 2019-08-25: qty 15

## 2019-08-25 NOTE — ED Notes (Signed)
Pt in bed pt responds with moans to tactile stimulation, pt cool to the touch, resps even and unlabored.  Cardiac and O2 sat monitor in place, pt satting 99% on room air, pt appears to be in an a fib like rhythm.

## 2019-08-25 NOTE — ED Notes (Signed)
Pt in bed, pt opens eyes and arouses to verbal stim.  Pt not answering questions.  Pt to floor.

## 2019-08-25 NOTE — ED Triage Notes (Signed)
Pt sent from [place] [facility]. Report given that she was unresponsive except to painful stimuli. Nurse reported that pt had been baseline and talking this morning. Reports critical high ammonia level this morning

## 2019-08-25 NOTE — ED Provider Notes (Signed)
[facility] EMERGENCY DEPARTMENT Provider Note   CSN: [id number] Arrival date & time: 08/25/19  1143     History Chief Complaint  Patient presents with  . Altered Mental Status    Heather Newman is a 79 y.o. female.  HPI   Patient is a 79 year old female with a history of Alzheimer's dementia, she is currently in a nursing facility.  She is also known to have cirrhosis of the liver and has had recurrent hepatic encephalopathy for which she had been seen in the emergency department admitted to the hospital.  In fact in the last 3 months the patient was admitted 4 times, May 21 for hepatic encephalopathy, May 8 for fever, April 18 for acute respiratory failure and February 13 for respiratory failure.  She is unable to give any information today, she is completely obtunded on arrival.  She comes from the nursing facility with a report of possible hepatic encephalopathy with a report of a ammonia that measured greater than 300 per the paramedic report.  The patient has been breathing spontaneously and has had a normal heart rate and blood pressure according to the paramedics however her mental status is significantly obtunded.  Level 5 caveat applies.  Past Medical History:  Diagnosis Date  . Alzheimer's disease ([place])   . 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 Bilateral primary osteoarthritis of knee   . CAD (coronary artery disease)   . Calculus of ureter   . CHF (congestive heart failure) ([place])   . COPD (chronic obstructive pulmonary disease) ([place])   . Depression   . Displaced spiral fracture of shaft of left femur, subsequent encounter for closed fracture with routine healing   . Essential hypertension   . GERD (gastroesophageal reflux disease)   . Hepatic cirrhosis ([place])   . Hyperlipidemia   . LBBB (left bundle branch block)   . Melanocytic nevi, unspecified   . OSA (obstructive sleep apnea)   . PAF (paroxysmal  atrial fibrillation) ([place])   . Type 2 diabetes mellitus ([place])   . Vitamin D deficiency     Patient Active Problem List   Diagnosis Date Noted  . Hepatic encephalopathy ([place]) 08/17/2019  . Rectal bleeding 08/04/2019  . Hyperglycemia due to diabetes mellitus ([place]) 08/04/2019  . Atrial fibrillation with RVR ([place]) 08/04/2019  . SIRS (systemic inflammatory response syndrome) ([place]) 08/04/2019  . Sepsis ([place]) 08/04/2019  . Fever 07/16/2019  . Acute on chronic respiratory failure with hypoxia ([place]) 07/15/2019  . Hypophosphatemia   . CAD (coronary artery disease)   . Essential hypertension   . Atrial flutter with rapid ventricular response ([place])   . History of cardiomyopathy   . Pressure injury of skin 05/13/2019  . Acute respiratory failure with hypoxia and hypercapnia ([place]) 05/12/2019  . COPD exacerbation ([place]) 05/12/2019  . Pressure injury of skin of left buttock 04/03/2019  . Pressure injury of heel, stage 2 ([place]) 04/03/2019  . Diabetes mellitus type 2 with complications ([place]) 99991111  . Nail overgrowth 03/29/2019  . Pressure injury of left heel, stage 2 ([place]) 03/29/2019  . Obstructive sleep apnea (adult) (pediatric)   . Altered mental status, unspecified   . Atherosclerosis of native arteries of right leg with ulceration of other part of foot ([place])   . Atrial premature depolarization   . Calculus of ureter   . Chronic systolic (congestive) heart failure ([place])   . Cognitive communication deficit   . Dementia  in other diseases classified elsewhere without behavioral disturbance ([place])   . Emphysema, unspecified ([place])   . Immobility syndrome (paraplegic)   . Left bundle-branch block, unspecified   . Major depressive disorder, single episode, unspecified   . Acute metabolic encephalopathy   . Other disorders of peripheral nervous system   . Other hypersomnia   . Other symptoms and signs concerning food and fluid intake   . Pain, unspecified   . Sleep disorder   . Stereotyped  movement disorders   . PAF (paroxysmal atrial fibrillation) ([place])   . Alzheimer's disease, unspecified (CODE) ([place])   . Coronary artery disease of native artery of native heart with stable angina pectoris ([place])   . Bilateral primary osteoarthritis of knee   . Circadian rhythm sleep disorder, advanced sleep phase type   . Constipation, unspecified   . Difficulty in walking, not elsewhere classified   . Insomnia due to medical condition   . Erythematous condition, unspecified   . Localized edema   . Melanocytic nevi, unspecified   . Other cirrhosis of liver ([place])   . Other fatigue   . Snoring   . Other symptoms and signs involving the musculoskeletal system   . Vitamin D deficiency, unspecified     Past Surgical History:  Procedure Laterality Date  . CHOLECYSTECTOMY    . SPINE SURGERY    . STRABISMUS SURGERY       OB History   No obstetric history on file.     Family History  Problem Relation Age of Onset  . Diabetes Mellitus II Mother   . Pneumonia Father     Social History   Tobacco Use  . Smoking status: Current Every Day Smoker    Types: Cigarettes  . Smokeless tobacco: Never Used  Substance Use Topics  . Alcohol use: Never  . Drug use: Never    Home Medications Prior to Admission medications   Medication Sig Start Date End Date Taking? Authorizing Provider  acetaminophen (TYLENOL) 325 MG tablet Take 325-650 mg by mouth every 6 (six) hours as needed for mild pain or moderate pain.    [provider]  albuterol (PROVENTIL) (2.5 MG/3ML) 0.083% nebulizer solution Take 3 mLs (2.5 mg total) by nebulization every 6 (six) hours as needed for wheezing or shortness of breath. 03/28/19   [name], NP  aspirin EC 81 MG EC tablet Take 1 tablet (81 mg total) by mouth daily with breakfast. 08/21/19   Roxan Hockey, MD  diltiazem (CARDIZEM CD) 240 MG 24 hr capsule Take 1 capsule (240 mg total) by mouth daily. For Heart 05/17/19   Roxan Hockey, MD    famotidine (PEPCID) 20 MG tablet Take 1 tablet (20 mg total) by mouth 2 (two) times daily. 07/17/19   Barton Dubois, MD  fluticasone (FLONASE) 50 MCG/ACT nasal spray Place 1 spray into both nostrils daily.    [provider]  Fluticasone-Salmeterol (ADVAIR) 100-50 MCG/DOSE AEPB Inhale 1 puff into the lungs 2 (two) times daily.    [provider]  furosemide (LASIX) 20 MG tablet Take 1 tablet (20 mg total) by mouth daily. 05/16/19   Roxan Hockey, MD  gabapentin (NEURONTIN) 100 MG capsule Take 2 capsules (200 mg total) by mouth 2 (two) times daily. 08/20/19   Roxan Hockey, MD  guaiFENesin (MUCINEX) 600 MG 12 hr tablet Take 1 tablet (600 mg total) by mouth 2 (two) times daily. 07/17/19   Barton Dubois, MD  hydrocortisone (ANUSOL-HC) 2.5 % rectal cream Place  rectally 2 (two) times daily for 10 days. Use for 10 days then okay to stop 08/20/19 08/30/19  Roxan Hockey, MD  insulin aspart (NOVOLOG) 100 UNIT/ML FlexPen Inject 0-6 Units into the skin See admin instructions. insulin aspart (novoLOG) injection 0- 6 Units 0- 6 Units Subcutaneous, 3 times daily with meals CBG < 70: Implement Hypoglycemia Standing Orders, -- CBG 70 - 120: 0 unit CBG 121 - 150: 0 unit  CBG 151 - 200: 0 unit CBG 201 - 250: 1 units CBG 251 - 300: 2 units CBG 301 - 350: 4 units  CBG 351 - 400: 6 units  CBG > 400: 6 units 08/06/19   Emokpae, Courage, MD  Insulin Glargine (BASAGLAR KWIKPEN) 100 UNIT/ML Inject 10 Units into the skin daily.    [provider]  Insulin Lispro (ADMELOG SOLOSTAR ) Inject 4 Units into the skin 3 (three) times daily before meals.     [provider]  ipratropium-albuterol (DUONEB) 0.5-2.5 (3) MG/3ML SOLN Take 3 mLs by nebulization 2 (two) times daily. 03/29/19   Fayrene Helper, MD  lactulose (CHRONULAC) 10 GM/15ML solution Take 30 mLs (20 g total) by mouth 2 (two) times daily. Goal is to have 2-3 mushy stools per day-avoid constipation and avoid frank watery diarrhea  type stool 08/20/19   Roxan Hockey, MD  meclizine (ANTIVERT) 25 MG tablet Take 25 mg by mouth 3 (three) times daily as needed for dizziness.    [provider]  metFORMIN (GLUCOPHAGE) 500 MG tablet Take 1 tablet (500 mg total) by mouth 2 (two) times daily with a meal. 05/16/19   Emokpae, Courage, MD  metoprolol succinate (TOPROL XL) 50 MG 24 hr tablet Take 1 tablet (50 mg total) by mouth daily. 05/16/19 05/15/20  Roxan Hockey, MD  mirtazapine (REMERON) 7.5 MG tablet Take 1 tablet (7.5 mg total) by mouth at bedtime. 08/20/19   Roxan Hockey, MD  nystatin (MYCOSTATIN/NYSTOP) powder Apply topically 3 (three) times daily. Apply to affected areas---Groin and perineum 08/20/19   Roxan Hockey, MD  nystatin-triamcinolone (MYCOLOG II) cream Apply 1 application topically 2 (two) times daily. Apply to buttocks--rash 08/20/19   Roxan Hockey, MD  Potassium Chloride ER 20 MEQ TBCR Take 20 mEq by mouth every Tuesday, Thursday, Saturday, and Sunday. -take potassium supplements while having BMs with Lactulose 08/21/19   Roxan Hockey, MD  rifaximin (XIFAXAN) 550 MG TABS tablet Take 1 tablet (550 mg total) by mouth 2 (two) times daily. 08/20/19   Roxan Hockey, MD  rosuvastatin (CRESTOR) 5 MG tablet Take 5 mg by mouth daily.    [provider]  traMADol (ULTRAM) 50 MG tablet Take 1 tablet (50 mg total) by mouth 2 (two) times daily as needed for moderate pain. 08/20/19   Roxan Hockey, MD  Vitamin D, Ergocalciferol, (DRISDOL) 1.25 MG (50000 UNIT) CAPS capsule Take 1 capsule (50,000 Units total) by mouth every 7 (seven) days. Patient taking differently: Take 50,000 Units by mouth every Monday.  04/09/19   Fayrene Helper, MD    Allergies    Patient has no known allergies.  Review of Systems   Review of Systems  Unable to perform ROS: Patient unresponsive    Physical Exam Updated Vital Signs BP 98/63 (BP Location: Right Arm)   Pulse 79   Temp 98.1 F (36.7 C) (Rectal)    Resp 18   Ht 1.727 m (5\' 8" )   Wt 78 kg   SpO2 98%   BMI 26.15 kg/m   Physical Exam  Vitals and nursing note reviewed.  Constitutional:      General: She is not in acute distress.    Appearance: She is well-developed. She is ill-appearing.  HENT:     Head: Normocephalic and atraumatic.     Mouth/Throat:     Mouth: Mucous membranes are dry.     Pharynx: No oropharyngeal exudate.  Eyes:     General: No scleral icterus.       Right eye: No discharge.        Left eye: No discharge.     Conjunctiva/sclera: Conjunctivae normal.     Pupils: Pupils are equal, round, and reactive to light.  Neck:     Thyroid: No thyromegaly.     Vascular: No JVD.  Cardiovascular:     Rate and Rhythm: Normal rate and regular rhythm.     Heart sounds: Normal heart sounds. No murmur. No friction rub. No gallop.   Pulmonary:     Effort: Pulmonary effort is normal. No respiratory distress.     Breath sounds: Normal breath sounds. No wheezing or rales.  Abdominal:     General: Bowel sounds are normal. There is no distension.     Palpations: Abdomen is soft. There is no mass.     Tenderness: There is no abdominal tenderness.  Musculoskeletal:        General: No tenderness. Normal range of motion.     Cervical back: Normal range of motion and neck supple.  Lymphadenopathy:     Cervical: No cervical adenopathy.  Skin:    General: Skin is warm and dry.     Findings: No erythema or rash.  Neurological:     Mental Status: She is alert.     Comments: The patient moves all 4 extremities minimally but spontaneously, she responds to deep painful stimuli in all 4 extremities, she is able to grimace on both sides of her face to painful stimuli, she does not answer questions, does not open her eyes, she groans to painful stimuli only.  Psychiatric:        Behavior: Behavior normal.     ED Results / Procedures / Treatments   Labs (all labs ordered are listed, but only abnormal results are displayed) Labs  Reviewed  AMMONIA - Abnormal; Notable for the following components:      Result Value   Ammonia 152 (*)    All other components within normal limits  COMPREHENSIVE METABOLIC PANEL - Abnormal; Notable for the following components:   Glucose, Bld 148 (*)    Albumin 3.1 (*)    Total Bilirubin 1.3 (*)    All other components within normal limits  CBC - Abnormal; Notable for the following components:   MCV 100.4 (*)    Platelets 421 (*)    All other components within normal limits  SARS CORONAVIRUS 2 BY RT PCR (HOSPITAL ORDER, [facility])    EKG None  Radiology No results found.  Procedures .Critical Care Performed by: Noemi Chapel, MD Authorized by: Noemi Chapel, MD   Critical care provider statement:    Critical care time (minutes):  35   Critical care time was exclusive of:  Separately billable procedures and treating other patients and teaching time   Critical care was necessary to treat or prevent imminent or life-threatening deterioration of the following conditions:  Metabolic crisis and CNS failure or compromise   Critical care was time spent personally by me on the following activities:  Blood draw  for specimens, development of treatment plan with patient or surrogate, discussions with consultants, evaluation of patient's response to treatment, examination of patient, obtaining history from patient or surrogate, ordering and performing treatments and interventions, ordering and review of laboratory studies, ordering and review of radiographic studies, pulse oximetry, re-evaluation of patient's condition and review of old charts   (including critical care time)  Medications Ordered in ED Medications  lactulose (CHRONULAC) enema 200 gm (has no administration in time range)  0.9 %  sodium chloride infusion ( Intravenous New Bag/Given 08/25/19 1216)    ED Course  I have reviewed the triage vital signs and the nursing notes.  Pertinent labs &  imaging results that were available during my care of the patient were reviewed by me and considered in my medical decision making (see chart for details).    MDM Rules/Calculators/A&P                      This patient has a presentation and exam consistent with possible hepatic encephalopathy.  At this time I feel is reasonable work-up would include checking liver function renal function blood counts and an ammonia level.  If elevated will treat aggressively, if normal will look for alternative sources however this patient is nonfocal and more encephalopathic fitting with her liver failure.  This patient's labs are significantly abnormal with an ammonia of 152, this rivals her prior numbers as one of the highest that she has had.  This is consistent with her clinical findings of being encephalopathic, she has been placed on a cardiac monitor and has had multiple repeat examinations and neurologic reevaluation, there is been no decline in her status but no improvement and she will need lactulose enemas.  I discussed her care with the hospitalist, Dr. Wynetta Emery who will come to admit her to the hospital, I appreciate their willingness to care for this very sick patient.  Final Clinical Impression(s) / ED Diagnoses Final diagnoses:  Hepatic encephalopathy ([place])      Noemi Chapel, MD 08/25/19 1250

## 2019-08-25 NOTE — H&P (Addendum)
History and Physical  Warren [id number] DOB: 1940/08/03 DOA: 08/25/2019  GI: Dr. Laural Golden    Patient coming from: [facility]   I have personally briefly reviewed patient's old medical records in [place]  Chief Complaint: obtunded   HPI: Heather Newman is a 79 y.o. female with known Alzheimer's dementia who is a resident at [facility] who recently was discharged from the hospital several days ago from a hospitalization for hepatic encephalopathy where she was treated with lactulose and started on rifaximin and had improved with treatments.  She was discharged on lactulose and rifaximin and unfortunately she was transferred back to the hospital today because of altered mental status and obtundation.  The patient is unable to provide history due to her current state.  Records from the facility reports that she had been given the lactulose.  Unable to determine what type of stool output she was having.  The goal had been to have her take lactulose to achieve goal of 3 stools per day.  Her lactulose had come down to 59 prior to discharge from hospital on 5/24 and mentation had improved.   Unfortunately the patient has had multiple hospitalizations this year for respiratory failure and hepatic encephalopathy.  She was noted to have a markedly elevated ammonia level of 159.  This is higher than it was during last admission.  She was started on a lactulose enema in the emergency department and started on gentle IV fluid hydration and admission was requested.  She is partial code.  No CPR and no intubation.  Review of Systems: UTO due to patient being obtunded   Past Medical History:  Diagnosis Date  . Alzheimer's disease ([place])   . 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 Bilateral primary osteoarthritis of knee   . CAD (coronary artery disease)   . Calculus of ureter   . CHF (congestive heart  failure) ([place])   . COPD (chronic obstructive pulmonary disease) ([place])   . Depression   . Displaced spiral fracture of shaft of left femur, subsequent encounter for closed fracture with routine healing   . Essential hypertension   . GERD (gastroesophageal reflux disease)   . Hepatic cirrhosis ([place])   . Hyperlipidemia   . LBBB (left bundle branch block)   . Melanocytic nevi, unspecified   . OSA (obstructive sleep apnea)   . PAF (paroxysmal atrial fibrillation) ([place])   . Type 2 diabetes mellitus ([place])   . Vitamin D deficiency     Past Surgical History:  Procedure Laterality Date  . CHOLECYSTECTOMY    . SPINE SURGERY    . STRABISMUS SURGERY       reports that she has been smoking cigarettes. She has never used smokeless tobacco. She reports that she does not drink alcohol or use drugs.  No Known Allergies  Family History  Problem Relation Age of Onset  . Diabetes Mellitus II Mother   . Pneumonia Father      Prior to Admission medications   Medication Sig Start Date End Date Taking? Authorizing Provider  acetaminophen (TYLENOL) 325 MG tablet Take 325-650 mg by mouth every 6 (six) hours as needed for mild pain or moderate pain.    [provider]  albuterol (PROVENTIL) (2.5 MG/3ML) 0.083% nebulizer solution Take 3 mLs (2.5 mg total) by nebulization every 6 (six) hours as needed for wheezing or shortness of breath. 03/28/19  [name], NP  aspirin EC 81 MG EC tablet Take 1 tablet (81 mg total) by mouth daily with breakfast. 08/21/19   Roxan Hockey, MD  diltiazem (CARDIZEM CD) 240 MG 24 hr capsule Take 1 capsule (240 mg total) by mouth daily. For Heart 05/17/19   Roxan Hockey, MD  famotidine (PEPCID) 20 MG tablet Take 1 tablet (20 mg total) by mouth 2 (two) times daily. 07/17/19   Barton Dubois, MD  fluticasone (FLONASE) 50 MCG/ACT nasal spray Place 1 spray into both nostrils daily.    [provider]  Fluticasone-Salmeterol (ADVAIR) 100-50 MCG/DOSE  AEPB Inhale 1 puff into the lungs 2 (two) times daily.    [provider]  furosemide (LASIX) 20 MG tablet Take 1 tablet (20 mg total) by mouth daily. 05/16/19   Roxan Hockey, MD  gabapentin (NEURONTIN) 100 MG capsule Take 2 capsules (200 mg total) by mouth 2 (two) times daily. 08/20/19   Roxan Hockey, MD  guaiFENesin (MUCINEX) 600 MG 12 hr tablet Take 1 tablet (600 mg total) by mouth 2 (two) times daily. 07/17/19   Barton Dubois, MD  hydrocortisone (ANUSOL-HC) 2.5 % rectal cream Place rectally 2 (two) times daily for 10 days. Use for 10 days then okay to stop 08/20/19 08/30/19  Roxan Hockey, MD  insulin aspart (NOVOLOG) 100 UNIT/ML FlexPen Inject 0-6 Units into the skin See admin instructions. insulin aspart (novoLOG) injection 0- 6 Units 0- 6 Units Subcutaneous, 3 times daily with meals CBG < 70: Implement Hypoglycemia Standing Orders, -- CBG 70 - 120: 0 unit CBG 121 - 150: 0 unit  CBG 151 - 200: 0 unit CBG 201 - 250: 1 units CBG 251 - 300: 2 units CBG 301 - 350: 4 units  CBG 351 - 400: 6 units  CBG > 400: 6 units 08/06/19   Emokpae, Courage, MD  Insulin Glargine (BASAGLAR KWIKPEN) 100 UNIT/ML Inject 10 Units into the skin daily.    [provider]  Insulin Lispro (ADMELOG SOLOSTAR [place]) Inject 4 Units into the skin 3 (three) times daily before meals.     [provider]  ipratropium-albuterol (DUONEB) 0.5-2.5 (3) MG/3ML SOLN Take 3 mLs by nebulization 2 (two) times daily. 03/29/19   Fayrene Helper, MD  lactulose (CHRONULAC) 10 GM/15ML solution Take 30 mLs (20 g total) by mouth 2 (two) times daily. Goal is to have 2-3 mushy stools per day-avoid constipation and avoid frank watery diarrhea type stool 08/20/19   Roxan Hockey, MD  meclizine (ANTIVERT) 25 MG tablet Take 25 mg by mouth 3 (three) times daily as needed for dizziness.    [provider]  metFORMIN (GLUCOPHAGE) 500 MG tablet Take 1 tablet (500 mg total) by mouth 2 (two) times daily with a meal.  05/16/19   Emokpae, Courage, MD  metoprolol succinate (TOPROL XL) 50 MG 24 hr tablet Take 1 tablet (50 mg total) by mouth daily. 05/16/19 05/15/20  Roxan Hockey, MD  mirtazapine (REMERON) 7.5 MG tablet Take 1 tablet (7.5 mg total) by mouth at bedtime. 08/20/19   Roxan Hockey, MD  nystatin (MYCOSTATIN/NYSTOP) powder Apply topically 3 (three) times daily. Apply to affected areas---Groin and perineum 08/20/19   Roxan Hockey, MD  nystatin-triamcinolone (MYCOLOG II) cream Apply 1 application topically 2 (two) times daily. Apply to buttocks--rash 08/20/19   Roxan Hockey, MD  Potassium Chloride ER 20 MEQ TBCR Take 20 mEq by mouth every Tuesday, Thursday, Saturday, and Sunday. -take potassium supplements while having BMs with Lactulose 08/21/19  Roxan Hockey, MD  rifaximin (XIFAXAN) 550 MG TABS tablet Take 1 tablet (550 mg total) by mouth 2 (two) times daily. 08/20/19   Roxan Hockey, MD  rosuvastatin (CRESTOR) 5 MG tablet Take 5 mg by mouth daily.    [provider]  traMADol (ULTRAM) 50 MG tablet Take 1 tablet (50 mg total) by mouth 2 (two) times daily as needed for moderate pain. 08/20/19   Roxan Hockey, MD  Vitamin D, Ergocalciferol, (DRISDOL) 1.25 MG (50000 UNIT) CAPS capsule Take 1 capsule (50,000 Units total) by mouth every 7 (seven) days. Patient taking differently: Take 50,000 Units by mouth every Monday.  04/09/19   Fayrene Helper, MD    Physical Exam: Vitals:   08/25/19 1153 08/25/19 1155 08/25/19 1159 08/25/19 1216  BP: 120/72  120/77 98/63  Pulse: 75  76 79  Resp: 19  18 18   Temp:    98.1 F (36.7 C)  TempSrc:    Rectal  SpO2: 95%  96% 98%  Weight:  78 kg    Height:  5\' 8"  (1.727 m)      Constitutional: Severely obtunded groaning and spontaneous verbalizations and eye movement. Eyes: PERRL, lids and conjunctivae normal ENMT: Mucous membranes are dry. Posterior pharynx clear of any exudate or lesions. Neck: normal, supple, no masses, no  thyromegaly Respiratory: No Rales heard bilateral breath sounds. Normal respiratory effort. No accessory muscle use.  Cardiovascular: Normal S1-S2 sounds.  Trace pretibial extremity edema. 2+ pedal pulses. No carotid bruits.  Abdomen: no masses palpated. No hepatosplenomegaly. Bowel sounds positive.  Musculoskeletal: no clubbing / cyanosis. No joint deformity upper and lower extremities. Good ROM, no contractures. Normal muscle tone.  Skin: no rashes, lesions, ulcers. No induration Neurologic: No gross focal deficits, moving all extremities spontaneously. Psychiatric: Encephalopathic.   Labs on Admission: I have personally reviewed following labs and imaging studies  CBC: Recent Labs  Lab 08/20/19 0430 08/25/19 1155  WBC 10.0 9.2  HGB 10.8* 14.2  HCT 33.9* 44.7  MCV 100.9* 100.4*  PLT 281 [id number]*   Basic Metabolic Panel: Recent Labs  Lab 08/20/19 0430 08/25/19 1155  NA 139 141  K 2.9* 4.3  CL 99 101  CO2 30 30  GLUCOSE 114* 148*  BUN 12 20  CREATININE 0.44 0.63  CALCIUM 8.4* 9.5  MG 1.6*  --    GFR: Estimated Creatinine Clearance: 64.6 mL/min (by C-G formula based on SCr of 0.63 mg/dL). Liver Function Tests: Recent Labs  Lab 08/20/19 0430 08/25/19 1155  AST 17 27  ALT 13 19  ALKPHOS 79 115  BILITOT 2.0* 1.3*  PROT 5.3* 6.8  ALBUMIN 2.3* 3.1*   No results for input(s): LIPASE, AMYLASE in the last 168 hours. Recent Labs  Lab 08/20/19 0430 08/25/19 1155  AMMONIA 59* 152*   Coagulation Profile: No results for input(s): INR, PROTIME in the last 168 hours. Cardiac Enzymes: No results for input(s): CKTOTAL, CKMB, CKMBINDEX, TROPONINI in the last 168 hours. BNP (last 3 results) No results for input(s): PROBNP in the last 8760 hours. HbA1C: No results for input(s): HGBA1C in the last 72 hours. CBG: Recent Labs  Lab 08/19/19 1155 08/19/19 1616 08/19/19 2121 08/20/19 0731 08/20/19 1120  GLUCAP 149* 226* 170* 126* 189*   Lipid Profile: No results for  input(s): CHOL, HDL, LDLCALC, TRIG, CHOLHDL, LDLDIRECT in the last 72 hours. Thyroid Function Tests: No results for input(s): TSH, T4TOTAL, FREET4, T3FREE, THYROIDAB in the last 72 hours. Anemia Panel: No results for input(s): VITAMINB12, FOLATE,  FERRITIN, TIBC, IRON, RETICCTPCT in the last 72 hours. Urine analysis:    Component Value Date/Time   COLORURINE YELLOW 08/17/2019 [place] 08/17/2019 1525   LABSPEC 1.014 08/17/2019 1525   PHURINE 7.0 08/17/2019 1525   GLUCOSEU NEGATIVE 08/17/2019 1525   HGBUR MODERATE (A) 08/17/2019 1525   BILIRUBINUR NEGATIVE 08/17/2019 1525   KETONESUR NEGATIVE 08/17/2019 1525   PROTEINUR NEGATIVE 08/17/2019 1525   NITRITE NEGATIVE 08/17/2019 [place] 08/17/2019 1525    Radiological Exams on Admission: No results found.  Assessment/Plan Principal Problem:   Hepatic encephalopathy (HCC) Active Problems:   Altered mental status, unspecified   Chronic systolic (congestive) heart failure (HCC)   Dementia in other diseases classified elsewhere without behavioral disturbance (HCC)   Emphysema, unspecified (HCC)   Acute metabolic encephalopathy   Alzheimer's disease, unspecified (CODE) ([place])   Coronary artery disease of native artery of native heart with stable angina pectoris (HCC)   Bilateral primary osteoarthritis of knee   Circadian rhythm sleep disorder, advanced sleep phase type   Other cirrhosis of liver ([place])   Diabetes mellitus type 2 with complications (HCC)   Pressure injury of left heel, stage 2 (HCC)   Pressure injury of heel, stage 2 (HCC)   CAD (coronary artery disease)   Essential hypertension   Hyperammonemia ([place])   DNR (do not resuscitate)   1. Hepatic encephalopathy -has been difficult to determine if she is taking the lactulose regularly at the facility.  She has a markedly elevated ammonia level at this time.  She has been started on lactulose enemas.  Continue lactulose therapy to bring  ammonia levels down.  Continue IV fluid hydration.  Restart home rifaximin.  Follow ammonia levels.  Neurochecks.  Liquid diet for now.  Aspiration precautions. 2. COPD/emphysema-bronchodilators from home resume. 3. CAD-resume home medications. 4. Dementia-delirium precautions, resume home meds. 5. Hyperammonemia-treating as above with lactulose. 6. Stage II sacral breakdown-wound care consult.  Air mattress overlay. 7. Chronic atrial fibrillation - Pt had not been on anticoagulation due to history of GI bleeding.   8. Stage II pressure injury of the heels-we will provide Prevalon boots as needed for protection.   9. DNR : Patient has a partial CODE STATUS no CPR and no intubation.  Her mortality risk is very high.  We will try and reach out to family members for update.  DVT prophylaxis: SCD/heparin    Code Status: Partial No CPR, DNI   Family Communication:  Granddaughter Kerri  Disposition Plan: SNF   Consults called:   Admission status: INP    Rael Yo MD [facility] How to contact the [facility] Attending or Consulting provider [place] or covering provider during after hours [place], for this patient?  1. Check the care team in [facility] and look for a) attending/consulting TR[facility] provider listed and b) the [facility] team listed 2. Log into [email or web address] and use [place]'s universal password to access. If you do not have the password, please contact the hospital operator. 3. Locate the [facility] provider you are looking for under [facility] and page to a number that you can be directly reached. 4. If you still have difficulty reaching the provider, please page the [facility] (Director on Call) for the Hospitalists listed on amion for assistance.   If 7PM-7AM, please contact night-coverage [email or web address] Password [facility]  08/25/2019, 1:37 PM

## 2019-08-25 NOTE — ED Notes (Signed)
Released lactulose enema, pt had large soft brown bm, cleaned pt, changed bedding as needed, changed depends.  Pt remains in bed with eyes closed, pt moans and moves extremities to tactile stimulation, pt has stage one/two pressure ulcer on sacrum and redness to groin, dressing placed on sacrum.

## 2019-08-25 NOTE — Progress Notes (Signed)
   08/25/19 1616  Assess: MEWS Score  BP (!) 110/97  Pulse Rate 89  Resp 14  Level of Consciousness Responds to Pain  SpO2 100 %  O2 Device Nasal Cannula  Patient Activity (if Appropriate) In bed  O2 Flow Rate (L/min) 2 L/min  Assess: MEWS Score  MEWS Temp 0  MEWS Systolic 0  MEWS Pulse 0  MEWS RR 0  MEWS LOC 2  MEWS Score 2  MEWS Score Color Yellow  Assess: if the MEWS score is Yellow or Red  Were vital signs taken at a resting state? Yes  Focused Assessment Documented focused assessment  Early Detection of Sepsis Score *See Row Information* Low  MEWS guidelines implemented *See Row Information* Yes  Treat  MEWS Interventions Escalated (See documentation below)  Take Vital Signs  Increase Vital Sign Frequency  Yellow: Q 2hr X 2 then Q 4hr X 2, if remains yellow, continue Q 4hrs  Escalate  MEWS: Escalate Yellow: discuss with charge nurse/RN and consider discussing with provider and RRT  Notify: Charge Nurse/RN  Name of Charge Nurse/RN Notified [place] Lighter  Date Charge Nurse/RN Notified 08/25/19  Time Charge Nurse/RN Notified 1616  Document  Patient Outcome Other (Comment) (Patient remains same)  Progress note created (see row info) Yes

## 2019-08-26 ENCOUNTER — Inpatient Hospital Stay (HOSPITAL_COMMUNITY): Payer: Medicare Other

## 2019-08-26 DIAGNOSIS — L89612 Pressure ulcer of right heel, stage 2: Secondary | ICD-10-CM

## 2019-08-26 DIAGNOSIS — J9601 Acute respiratory failure with hypoxia: Secondary | ICD-10-CM

## 2019-08-26 DIAGNOSIS — K7469 Other cirrhosis of liver: Secondary | ICD-10-CM

## 2019-08-26 DIAGNOSIS — I639 Cerebral infarction, unspecified: Secondary | ICD-10-CM | POA: Clinically undetermined

## 2019-08-26 DIAGNOSIS — L89622 Pressure ulcer of left heel, stage 2: Secondary | ICD-10-CM

## 2019-08-26 LAB — BLOOD GAS, ARTERIAL
Acid-base deficit: 0.9 mmol/L (ref 0.0–2.0)
Bicarbonate: 23.3 mmol/L (ref 20.0–28.0)
Drawn by: 137461
FIO2: 36
O2 Saturation: 95.6 %
Patient temperature: 37
pCO2 arterial: 38.9 mmHg (ref 32.0–48.0)
pH, Arterial: 7.395 (ref 7.350–7.450)
pO2, Arterial: 84.2 mmHg (ref 83.0–108.0)

## 2019-08-26 LAB — COMPREHENSIVE METABOLIC PANEL
ALT: 20 U/L (ref 0–44)
AST: 32 U/L (ref 15–41)
Albumin: 3.2 g/dL — ABNORMAL LOW (ref 3.5–5.0)
Alkaline Phosphatase: 126 U/L (ref 38–126)
Anion gap: 15 (ref 5–15)
BUN: 28 mg/dL — ABNORMAL HIGH (ref 8–23)
CO2: 22 mmol/L (ref 22–32)
Calcium: 9.9 mg/dL (ref 8.9–10.3)
Chloride: 105 mmol/L (ref 98–111)
Creatinine, Ser: 0.8 mg/dL (ref 0.44–1.00)
GFR calc Af Amer: >60 mL/min (ref >60)
GFR calc non Af Amer: >60 mL/min (ref >60)
Glucose, Bld: 199 mg/dL — ABNORMAL HIGH (ref 70–99)
Potassium: 4.7 mmol/L (ref 3.5–5.1)
Sodium: 142 mmol/L (ref 135–145)
Total Bilirubin: 1.6 mg/dL — ABNORMAL HIGH (ref 0.3–1.2)
Total Protein: 7.4 g/dL (ref 6.5–8.1)

## 2019-08-26 LAB — GLUCOSE, CAPILLARY
Glucose-Capillary: 182 mg/dL — ABNORMAL HIGH (ref 70–99)
Glucose-Capillary: 168 mg/dL — ABNORMAL HIGH (ref 70–99)
Glucose-Capillary: 185 mg/dL — ABNORMAL HIGH (ref 70–99)
Glucose-Capillary: 166 mg/dL — ABNORMAL HIGH (ref 70–99)

## 2019-08-26 LAB — AMMONIA: Ammonia: 53 umol/L — ABNORMAL HIGH (ref 9–35)

## 2019-08-26 MED ORDER — ONDANSETRON HCL 4 MG/2ML IJ SOLN: 4.0000 mg | Freq: Four times a day (QID) | INTRAMUSCULAR | Status: DC | PRN

## 2019-08-26 MED ORDER — DILTIAZEM HCL 60 MG PO TABS: 90.0000 mg | ORAL_TABLET | Freq: Four times a day (QID) | ORAL | Status: DC

## 2019-08-26 MED ORDER — METOPROLOL TARTRATE 5 MG/5ML IV SOLN
5.0000 mg | Freq: Four times a day (QID) | INTRAVENOUS | Status: DC
  Administered 2019-08-26 (×2): 5 mg via INTRAVENOUS
  Filled 2019-08-26 (×3): qty 5

## 2019-08-26 MED ORDER — MORPHINE BOLUS VIA INFUSION
1.0000 mg | INTRAVENOUS | Status: DC | PRN
  Filled 2019-08-26: qty 1

## 2019-08-26 MED ORDER — GLYCOPYRROLATE 0.2 MG/ML IJ SOLN: 0.2000 mg | INTRAMUSCULAR | Status: DC | PRN

## 2019-08-26 MED ORDER — MORPHINE 100MG IN NS 100ML (1MG/ML) PREMIX INFUSION
1.0000 mg/h | INTRAVENOUS | Status: DC
  Administered 2019-08-26: 1 mg/h via INTRAVENOUS
  Filled 2019-08-26 (×2): qty 100

## 2019-08-26 MED ORDER — DILTIAZEM HCL 25 MG/5ML IV SOLN
10.0000 mg | Freq: Once | INTRAVENOUS | Status: AC
  Administered 2019-08-26: 10 mg via INTRAVENOUS
  Filled 2019-08-26: qty 5

## 2019-08-26 MED ORDER — LEVALBUTEROL HCL 0.63 MG/3ML IN NEBU
INHALATION_SOLUTION | RESPIRATORY_TRACT | Status: AC
  Administered 2019-08-26: 0.63 mg via RESPIRATORY_TRACT
  Filled 2019-08-26: qty 3

## 2019-08-26 MED ORDER — GLYCOPYRROLATE 0.2 MG/ML IJ SOLN
0.2000 mg | INTRAMUSCULAR | Status: DC | PRN
  Administered 2019-08-27: 0.2 mg via INTRAVENOUS
  Filled 2019-08-26: qty 1

## 2019-08-26 MED ORDER — LORAZEPAM 1 MG PO TABS: 1.0000 mg | ORAL_TABLET | ORAL | Status: DC | PRN

## 2019-08-26 MED ORDER — DILTIAZEM HCL 25 MG/5ML IV SOLN
10.0000 mg | Freq: Once | INTRAVENOUS | Status: AC
  Administered 2019-08-26: 10 mg via INTRAVENOUS

## 2019-08-26 MED ORDER — DILTIAZEM HCL ER COATED BEADS 240 MG PO CP24: 240.0000 mg | ORAL_CAPSULE | Freq: Every day | ORAL | Status: DC

## 2019-08-26 MED ORDER — LORAZEPAM 2 MG/ML IJ SOLN
1.0000 mg | INTRAMUSCULAR | Status: DC | PRN
  Administered 2019-08-27: 1 mg via INTRAVENOUS
  Filled 2019-08-26: qty 1

## 2019-08-26 MED ORDER — METOPROLOL TARTRATE 5 MG/5ML IV SOLN
5.0000 mg | Freq: Once | INTRAVENOUS | Status: AC
  Administered 2019-08-26: 5 mg via INTRAVENOUS
  Filled 2019-08-26: qty 5

## 2019-08-26 MED ORDER — DILTIAZEM LOAD VIA INFUSION
10.0000 mg | Freq: Once | INTRAVENOUS | Status: DC
  Filled 2019-08-26: qty 10

## 2019-08-26 MED ORDER — LORAZEPAM 2 MG/ML IJ SOLN: 1.0000 mg | INTRAMUSCULAR | Status: DC | PRN

## 2019-08-26 MED ORDER — DIPHENHYDRAMINE HCL 50 MG/ML IJ SOLN: 12.5000 mg | INTRAMUSCULAR | Status: DC | PRN

## 2019-08-26 MED ORDER — ONDANSETRON 4 MG PO TBDP: 4.0000 mg | ORAL_TABLET | Freq: Four times a day (QID) | ORAL | Status: DC | PRN

## 2019-08-26 MED ORDER — LORAZEPAM 2 MG/ML PO CONC: 1.0000 mg | ORAL | Status: DC | PRN

## 2019-08-26 MED ORDER — LEVALBUTEROL HCL 0.63 MG/3ML IN NEBU: 0.6300 mg | INHALATION_SOLUTION | Freq: Once | RESPIRATORY_TRACT | Status: AC

## 2019-08-26 MED ORDER — DILTIAZEM HCL 60 MG PO TABS: 60.0000 mg | ORAL_TABLET | Freq: Four times a day (QID) | ORAL | Status: DC

## 2019-08-26 MED ORDER — GLYCOPYRROLATE 1 MG PO TABS
1.0000 mg | ORAL_TABLET | ORAL | Status: DC | PRN
  Filled 2019-08-26: qty 1

## 2019-08-26 MED ORDER — STROKE: EARLY STAGES OF RECOVERY BOOK: Freq: Once | Status: AC

## 2019-08-26 NOTE — Progress Notes (Addendum)
PROGRESS NOTE   Heather Newman  [id number] DOB: [birth year] DOA: 08/25/2019 PCP: Patient, No Pcp Per   Chief Complaint  Patient presents with  . Altered Mental Status     Brief Admission History:  79 y.o. female with known Alzheimer's dementia who is a resident at [facility] who recently was discharged from the hospital several days ago from a hospitalization for hepatic encephalopathy where she was treated with lactulose and started on rifaximin and had improved with treatments.  She was discharged on lactulose and rifaximin and unfortunately she was transferred back to the hospital today because of altered mental status and obtundation.   Unfortunately the patient has had multiple hospitalizations this year for respiratory failure and hepatic encephalopathy.  She was noted to have a markedly elevated ammonia level of 159.  This is higher than it was during last admission.  She was started on a lactulose enema in the emergency department and started on gentle IV fluid hydration and admission was requested.  Assessment & Plan:   Principal Problem:   Acute CVA (cerebrovascular accident) ([place]) Active Problems:   Altered mental status, unspecified   Chronic systolic (congestive) heart failure (HCC)   Dementia in other diseases classified elsewhere without behavioral disturbance (HCC)   Emphysema, unspecified (HCC)   Acute metabolic encephalopathy   Alzheimer's disease, unspecified (CODE) ([place])   Coronary artery disease of native artery of native heart with stable angina pectoris ([place])   Bilateral primary osteoarthritis of knee   Circadian rhythm sleep disorder, advanced sleep phase type   Other cirrhosis of liver ([place])   Diabetes mellitus type 2 with complications (HCC)   Pressure injury of left heel, stage 2 (HCC)   Pressure injury of heel, stage 2 (HCC)   CAD (coronary artery disease)   Essential hypertension   Hepatic encephalopathy ([place])   Hyperammonemia ([place])   DNR (do not  resuscitate)  1. Hepatic encephalopathy -family reports they have not been giving patient lactulose regularly at the skilled nursing facility.  She presented with a markedly elevated ammonia level but it has come down with lactulose enemas.  She remains encephalopathic. 2. Acute CVA - CT this morning confirms acute right internal capsule and lentiform nucleus CVA.  I spoke with neurologist Dr. Lorraine Lax at [facility] who will see in consult. If there is a delay in bed placement for transfer he requested getting teleneurology consult while she is waiting which we have done,  Started stroke orderset bundle. MRI unavailable at [facility] for next 2 days, will have to get MRI at [facility].  Gentle IV fluids, neuro checks. Further recommendations per teleneurology physician. Allow permissive hypertension.  3. COPD/emphysema-bronchodilators from home resume. 4. CAD-resume home medications. 5. Dementia-delirium precautions, resume home meds. 6. Hyperammonemia-treating as above with lactulose.  Levels coming down.  7. Stage II sacral breakdown-wound care consult.  Air mattress overlay. 8. Chronic atrial fibrillation - Pt had NOT been on anticoagulation due to history of GI bleeding.  IV metoprolol ordered for rate control.  9. Stage II pressure injury of the heels-we will provide Prevalon boots as needed for protection.   10. DNR : Patient has a partial CODE STATUS no CPR and no intubation.  Her mortality risk is very high.  Will reach out to family members for update.  DVT prophylaxis: SCD/heparin    Code Status: Family changed code today: Full code but compressions up to 15 mins only.   Family Communication:  Granddaughter Kerri  Disposition Plan: SNF   Consults called:  Admission status: INP    Status is: Inpatient  Remains inpatient appropriate because:Altered mental status, IV treatments appropriate due to intensity of illness or inability to take PO and Inpatient level of care appropriate due to severity of  illness  Dispo: The patient is from: SNF              Anticipated d/c is to: SNF              Anticipated d/c date is: 3 days              Patient currently is not medically stable to d/c.  Consultants:   Neurology / Teleneurology   Procedures:    Antimicrobials:    Subjective: Pt remains obtunded and new left facial droop seen this morning.   Objective: Vitals:   08/26/19 0423 08/26/19 0615 08/26/19 0731 08/26/19 1254  BP: 118/83 (!) 152/66  (!) 155/95  Pulse: (!) 117 (!) 116  61  Resp: 20 18  16   Temp: 97.6 F (36.4 C) 97.8 F (36.6 C)  98.1 F (36.7 C)  TempSrc:  Oral  Oral  SpO2: 100% 100% 98% 99%  Weight:      Height:        Intake/Output Summary (Last 24 hours) at 08/26/2019 1318 Last data filed at 08/26/2019 0705 Gross per 24 hour  Intake 724.19 ml  Output --  Net 724.19 ml   Filed Weights   08/25/19 1155  Weight: 78 kg    Examination:  General exam: Appears calm and comfortable  Respiratory system: Clear to auscultation. Respiratory effort normal. Cardiovascular system: S1 & S2 heard, RRR. No JVD, murmurs, rubs, gallops or clicks. No pedal edema. Gastrointestinal system: Abdomen is nondistended, soft and nontender. No organomegaly or masses felt. Normal bowel sounds heard. Central nervous system: Alert and oriented. No focal neurological deficits. Extremities: Symmetric 5 x 5 power. Skin: No rashes, lesions or ulcers Psychiatry: Judgement and insight appear normal. Mood & affect appropriate.   Data Reviewed: I have personally reviewed following labs and imaging studies  CBC: Recent Labs  Lab 08/20/19 0430 08/25/19 1155  WBC 10.0 9.2  HGB 10.8* 14.2  HCT 33.9* 44.7  MCV 100.9* 100.4*  PLT 281 421*    Basic Metabolic Panel: Recent Labs  Lab 08/20/19 0430 08/25/19 1155 08/26/19 0757  NA 139 141 142  K 2.9* 4.3 4.7  CL 99 101 105  CO2 30 30 22   GLUCOSE 114* 148* 199*  BUN 12 20 28*  CREATININE 0.44 0.63 0.80  CALCIUM 8.4* 9.5 9.9   MG 1.6*  --   --     GFR: Estimated Creatinine Clearance: 64.6 mL/min (by C-G formula based on SCr of 0.8 mg/dL).  Liver Function Tests: Recent Labs  Lab 08/20/19 0430 08/25/19 1155 08/26/19 0757  AST 17 27 32  ALT 13 19 20   ALKPHOS 79 115 126  BILITOT 2.0* 1.3* 1.6*  PROT 5.3* 6.8 7.4  ALBUMIN 2.3* 3.1* 3.2*    CBG: Recent Labs  Lab 08/25/19 1854 08/25/19 2214 08/26/19 0353 08/26/19 0740 08/26/19 1124  GLUCAP 138* 169* 182* 168* 185*    Recent Results (from the past 240 hour(s))  SARS Coronavirus 2 by RT PCR (hospital order, performed in [facility] hospital lab) Nasopharyngeal Nasopharyngeal Swab     Status: None   Collection Time: 08/17/19  6:00 PM   Specimen: Nasopharyngeal Swab  Result Value Ref Range Status   SARS Coronavirus 2 NEGATIVE NEGATIVE Final  Comment: (NOTE) SARS-CoV-2 target nucleic acids are NOT DETECTED. The SARS-CoV-2 RNA is generally detectable in upper and lower respiratory specimens during the acute phase of infection. The lowest concentration of SARS-CoV-2 viral copies this assay can detect is 250 copies / mL. A negative result does not preclude SARS-CoV-2 infection and should not be used as the sole basis for treatment or other patient management decisions.  A negative result may occur with improper specimen collection / handling, submission of specimen other than nasopharyngeal swab, presence of viral mutation(s) within the areas targeted by this assay, and inadequate number of viral copies (<250 copies / mL). A negative result must be combined with clinical observations, patient history, and epidemiological information. Fact Sheet for Patients:   StrictlyIdeas.no Fact Sheet for Healthcare Providers: BankingDealers.co.za This test is not yet approved or cleared  by the [place] FDA and has been authorized for detection and/or diagnosis of SARS-CoV-2 by FDA under an Emergency Use  Authorization (EUA).  This EUA will remain in effect (meaning this test can be used) for the duration of the COVID-19 declaration under Section 564(b)(1) of the Act, 21 U.S.C. section 360bbb-3(b)(1), unless the authorization is terminated or revoked sooner. Performed at [facility], [address], [place], [place] [address]   Culture, blood (Routine X 2) w Reflex to ID Panel     Status: None   Collection Time: 08/18/19  5:47 PM   Specimen: BLOOD RIGHT FOREARM  Result Value Ref Range Status   Specimen Description BLOOD RIGHT FOREARM  Final   Special Requests   Final    BOTTLES DRAWN AEROBIC AND ANAEROBIC Blood Culture adequate volume   Culture   Final    NO GROWTH 5 DAYS Performed at [facility], [address], [place], [place] [address]    Report Status 08/23/2019 FINAL  Final  Culture, blood (Routine X 2) w Reflex to ID Panel     Status: None   Collection Time: 08/18/19  5:48 PM   Specimen: BLOOD LEFT HAND  Result Value Ref Range Status   Specimen Description BLOOD LEFT HAND  Final   Special Requests   Final    BOTTLES DRAWN AEROBIC ONLY Blood Culture adequate volume   Culture   Final    NO GROWTH 5 DAYS Performed at [facility], [address] [place], [place] [address]    Report Status 08/23/2019 FINAL  Final  SARS Coronavirus 2 by RT PCR (hospital order, performed in [facility] hospital lab) Nasopharyngeal Nasopharyngeal Swab     Status: None   Collection Time: 08/25/19 12:49 PM   Specimen: Nasopharyngeal Swab  Result Value Ref Range Status   SARS Coronavirus 2 NEGATIVE NEGATIVE Final    Comment: (NOTE) SARS-CoV-2 target nucleic acids are NOT DETECTED. The SARS-CoV-2 RNA is generally detectable in upper and lower respiratory specimens during the acute phase of infection. The lowest concentration of SARS-CoV-2 viral copies this assay can detect is 250 copies / mL. A negative result does not preclude SARS-CoV-2 infection and should not be used as the sole basis for  treatment or other patient management decisions.  A negative result may occur with improper specimen collection / handling, submission of specimen other than nasopharyngeal swab, presence of viral mutation(s) within the areas targeted by this assay, and inadequate number of viral copies (<250 copies / mL). A negative result must be combined with clinical observations, patient history, and epidemiological information. Fact Sheet for Patients:   StrictlyIdeas.no Fact Sheet for Healthcare Providers: BankingDealers.co.za  This test is not yet approved or cleared  by the [place] and has been authorized for detection and/or diagnosis of SARS-CoV-2 by FDA under an Emergency Use Authorization (EUA).  This EUA will remain in effect (meaning this test can be used) for the duration of the COVID-19 declaration under Section 564(b)(1) of the Act, 21 U.S.C. section 360bbb-3(b)(1), unless the authorization is terminated or revoked sooner. Performed at [facility], [address], [place], [place] [address]      Radiology Studies: CT HEAD WO CONTRAST  Result Date: 08/26/2019 CLINICAL DATA:  Unresponsive/altered mental status EXAM: CT HEAD WITHOUT CONTRAST TECHNIQUE: Contiguous axial images were obtained from the base of the skull through the vertex without intravenous contrast. COMPARISON:  None. FINDINGS: Brain: There is mild diffuse atrophy. There is decreased attenuation involving the right lentiform nucleus both anteriorly and posteriorly as well as the right internal, external, and extreme capsules. Claustrum does not appear involved with this apparent acute infarct on the right. There is no focal mass or hemorrhage. There is no extra-axial fluid collection, or midline shift. Elsewhere there is slight small vessel disease in the centra semiovale bilaterally. Vascular: No hyperdense vessel. There is arterial vascular calcification in each distal  vertebral artery as well as in the carotid siphon regions bilaterally. Skull: Bony calvarium appears intact. Sinuses/Orbits: There is mucosal thickening in multiple ethmoid air cells. Other visualized paranasal sinuses are clear. Orbits appear symmetric bilaterally. Other: Visualized mastoid air cells are clear. IMPRESSION: 1. Recent and likely acute infarct involving the right lentiform nucleus as well as the right internal, external, and extreme capsule regions. Localized cytotoxic edema in these areas. 2. There is mild atrophy with mild periventricular small vessel disease. No mass or hemorrhage evident. 3.  There are foci of arterial vascular calcification. 4.  There is mucosal thickening in several ethmoid air cells. These results will be called to the ordering clinician or representative by the Radiologist Assistant, and communication documented in the PACS or [employer]. Electronically Signed   By: Lowella Grip III M.D.   On: 08/26/2019 10:03     Scheduled Meds: . aspirin EC  81 mg Oral Q breakfast  . diltiazem  240 mg Oral Daily  . famotidine  20 mg Oral BID  . fluticasone  1 spray Each Nare Daily  . fluticasone furoate-vilanterol  1 puff Inhalation Daily  . furosemide  20 mg Oral Daily  . heparin  5,000 Units Subcutaneous Q8H  . insulin aspart  0-9 Units Subcutaneous TID WC  . insulin glargine  10 Units Subcutaneous QHS  . ipratropium-albuterol  3 mL Nebulization BID  . lactulose  300 mL Rectal Q8H  . metoprolol tartrate  5 mg Intravenous Q6H  . mirtazapine  7.5 mg Oral QHS  . nystatin   Topical TID  . rifaximin  550 mg Oral BID  . rosuvastatin  5 mg Oral QPM  . [START ON 08/27/2019] Vitamin D (Ergocalciferol)  50,000 Units Oral Q Mon   Continuous Infusions: . sodium chloride 50 mL/hr at 08/26/19 0617     LOS: 1 day   Time spent: 35 minutes    Carvel Huskins Wynetta Emery, MD How to contact the [facility] Attending or Consulting provider [place] or covering provider during after  hours [place], for this patient?  1. Check the care team in [facility] and look for a) attending/consulting TRH provider listed and b) the [facility] team listed 2. Log into [email or web address] and use [place]'s universal password  to access. If you do not have the password, please contact the hospital operator. 3. Locate the [facility] provider you are looking for under [facility] and page to a number that you can be directly reached. 4. If you still have difficulty reaching the provider, please page the [facility] (Director on Call) for the Hospitalists listed on amion for assistance.  08/26/2019, 1:18 PM

## 2019-08-26 NOTE — Progress Notes (Signed)
Patient responds to voice, mute. Unable to tolerate PO meds. MD notified.

## 2019-08-26 NOTE — TOC Initial Note (Addendum)
Transition of Care [facility]) - Initial/Assessment Note   Patient Details  Name: Heather Newman MRN: [id number] Date of Birth: 01-19-1941  Transition of Care [facility]) CM/SW Contact:    Sherie Don, LCSW Phone Number: 08/26/2019, 5:45 PM  Clinical Narrative: CSW received consult from patient's hospitalist at [facility], Dr. Sloan Leiter, for hospice house with [facility]. CSW called [facility] and spoke the RN, Elane Fritz. Clinicals faxed to [place]. Dr. Sloan Leiter updated about referral. CSW attempted to call patient's granddaughter, [name], but was unable to speak to her as she was feeling overwhelmed, so CSW spoke with patient's family friend, Government social research officer. Family is agreeable to hospice house referral. Dr. Sloan Leiter updated. Patient expected to transfer back to [facility].   Expected Discharge Plan: Hospice Medical Facility([facility]osp[facility] House for [facility]) Barriers to Discharge: Continued Medical Work up  Patient Goals and CMS Choice Patient states their goals for this hospitalization and ongoing recovery are:: Patient be transferred to hospice house CMS Medicare.gov Compare Post Acute Care list provided to:: Patient Represenative (must comment)(MariJo Nelson-Decker (family friend)) Choice offered to / list presented to : [place] / Guardian  Expected Discharge Plan and Services Expected Discharge Plan: Hospice Medical Facility([facility] for [facility]) In-house Referral: Clinical Social Work Post Acute Care Choice: Hospice Living arrangements for the past 2 months: [place]  Prior Living Arrangements/Services Living arrangements for the past 2 months: [place] Lives with:: Facility Resident Patient language and need for interpreter reviewed:: Yes Do you feel safe going back to the place where you live?: Yes      Need for Family Participation in Patient Care: Yes (Comment)([name] Leverne Humbles (granddaughter) [place]:  [phone]) Care giver support system in place?: Yes (comment) Criminal Activity/Legal Involvement Pertinent to Current Situation/Hospitalization: No - Comment as needed  Activities of Daily Living Home Assistive Devices/Equipment: Wheelchair, Environmental consultant (specify type) ADL Screening (condition at time of admission) Patient's cognitive ability adequate to safely complete daily activities?: No Is the patient deaf or have difficulty hearing?: No Does the patient have difficulty seeing, even when wearing glasses/contacts?: No Does the patient have difficulty concentrating, remembering, or making decisions?: Yes Patient able to express need for assistance with ADLs?: No Does the patient have difficulty dressing or bathing?: Yes Independently performs ADLs?: No Communication: Independent Dressing (OT): Needs assistance Is this a change from baseline?: Pre-admission baseline Grooming: Needs assistance Is this a change from baseline?: Pre-admission baseline Feeding: Dependent Is this a change from baseline?: Pre-admission baseline Bathing: Needs assistance Is this a change from baseline?: Pre-admission baseline Toileting: Needs assistance Is this a change from baseline?: Pre-admission baseline In/Out Bed: Needs assistance Is this a change from baseline?: Pre-admission baseline Walks in Home: Needs assistance Is this a change from baseline?: Pre-admission baseline Does the patient have difficulty walking or climbing stairs?: Yes Weakness of Legs: Both Weakness of Arms/Hands: None  Emotional Assessment Appearance:: Appears stated age Attitude/Demeanor/Rapport: Unable to Assess Affect (typically observed): Unable to Assess Orientation: : Fluctuating Orientation (Suspected and/or reported [place]) Alcohol / Substance Use: Not Applicable Psych Involvement: No (comment)  Admission diagnosis:  Hepatic encephalopathy ([place]) [K72.90] Patient Active Problem List   Diagnosis Date Noted  .  Acute CVA (cerebrovascular accident) ([place]) 08/26/2019  . Hyperammonemia ([place]) 08/25/2019  . DNR (do not resuscitate) 08/25/2019  . Hepatic encephalopathy ([place]) 08/17/2019  . Rectal bleeding 08/04/2019  . Hyperglycemia due to diabetes mellitus ([place]) 08/04/2019  . Atrial fibrillation with RVR ([place]) 08/04/2019  .  SIRS (systemic inflammatory response syndrome) ([place]) 08/04/2019  . Sepsis ([place]) 08/04/2019  . Fever 07/16/2019  . Acute on chronic respiratory failure with hypoxia ([place]) 07/15/2019  . Hypophosphatemia   . CAD (coronary artery disease)   . Essential hypertension   . Atrial flutter with rapid ventricular response ([place])   . History of cardiomyopathy   . Pressure injury of skin 05/13/2019  . Acute respiratory failure with hypoxia and hypercapnia ([place]) 05/12/2019  . COPD exacerbation ([place]) 05/12/2019  . Pressure injury of skin of left buttock 04/03/2019  . Pressure injury of heel, stage 2 ([place]) 04/03/2019  . Diabetes mellitus type 2 with complications ([place]) [id number]  . Nail overgrowth 03/29/2019  . Pressure injury of left heel, stage 2 ([place]) 03/29/2019  . Obstructive sleep apnea (adult) (pediatric)   . Altered mental status, unspecified   . Atherosclerosis of native arteries of right leg with ulceration of other part of foot ([place])   . Atrial premature depolarization   . Calculus of ureter   . Chronic systolic (congestive) heart failure ([place])   . Cognitive communication deficit   . Dementia in other diseases classified elsewhere without behavioral disturbance ([place])   . Emphysema, unspecified ([place])   . Immobility syndrome (paraplegic)   . Left bundle-branch block, unspecified   . Major depressive disorder, single episode, unspecified   . Acute metabolic encephalopathy   . Other disorders of peripheral nervous system   . Other hypersomnia   . Other symptoms and signs concerning food and fluid intake   . Pain, unspecified   . Sleep disorder   . Stereotyped movement disorders    . PAF (paroxysmal atrial fibrillation) ([place])   . Alzheimer's disease, unspecified (CODE) ([place])   . Coronary artery disease of native artery of native heart with stable angina pectoris ([place])   . Bilateral primary osteoarthritis of knee   . Circadian rhythm sleep disorder, advanced sleep phase type   . Constipation, unspecified   . Difficulty in walking, not elsewhere classified   . Insomnia due to medical condition   . Erythematous condition, unspecified   . Localized edema   . Melanocytic nevi, unspecified   . Other cirrhosis of liver ([place])   . Other fatigue   . Snoring   . Other symptoms and signs involving the musculoskeletal system   . Vitamin D deficiency, unspecified    PCP:  Patient, No Pcp Per Pharmacy:   [place], [place] - [address] [place] [address] Phone: [phone] Fax: [phone]  [place], [place] - [address] [place] [address] Phone: [phone] Fax: [phone]  Readmission Risk Interventions No flowsheet data found.

## 2019-08-26 NOTE — Progress Notes (Signed)
Patient transferred back to [facility] [address], Room 313 via [place]. Morphine drip started. Vitals obtained. Patient nonverbal. Family notified.

## 2019-08-26 NOTE — Progress Notes (Signed)
Transfer Note:  Arrived from [facility]-unresponsive to a sternal rub,Afib RVR, "gurgling". Not moving left upper and lower extremity in response to pain.   Spoke with [employer] who is a co power of attorney along with patients grand daughter Aram Beecham is currently overwhelmed with anxiety given rapid deterioration. Patient at baseline has some dementia, liver cirrhoses-but is bed to wheelchair bound. Has a hx of Afib-but not deemed a anticoag candidate due to prior hx of GI bleeding.  O/E: Unresponsive "gurgling"-accumulating secretions Slight left facial droop Significant left sided hemiplegia Chest: transimitted upper airway sounds -though improved after suctioning  Imp Acute Hypoxic resp failure-with tenuous airway-due to acute CVA Hepatic Encephalopathy Hx of Liver Cirrhoses-NASH Afib RVR-not a anticoag candidate due to prior hx of GI bleed/Cirrhoses  Plan: Very tenuous situation-clearly has suffered a significant neuro deficit due to CVA- very poor functional baseline (bed to wheelchair bound). Long d/w with MariJo (co power of attorney)-who was on speaker phone with the the grand daughter-Kerri-who was obviously distraught in the background-explained poor prognoses and that neuro outcome would likely be very poor. Initially family wanted to patient to be a "full code" for 15 minutes-but after further d/w family by myself and with Neuro NP-finally agreed for a DNI, ok for chest compressions, Defib etc.  Currently she is not stable for MRI/CT imaging-do not think that it will change immediate management anyway  Will get ABG, CXR  IV Metoprolol prn for RVR, if rate still is a issue-will start cardizem infusion.  Will continue to engage with family-but if neuro decline continues-suspect it is best to transition to full DNR and consider comfort measures-have asked PCCM to opine regarding this issue. Will get Pall care consult as well.  Neuro also at bedside-confirms poor neuro  prognoses.  Total time spent 45 min  The patient is critically ill with multiple organ system failure and requires high complexity decision making for assessment and support, frequent evaluation and titration of therapies, advanced monitoring, review of radiographic studies and interpretation of complex data.

## 2019-08-26 NOTE — Progress Notes (Signed)
Patient's family members Tomi Likens, Chauncey Reading and granddaughter [name] has requested that patient be full code status. They said that patient wants CPR however she does not want CPR to go beyond 15 minutes. According to [facility] "patient wants to be a full code but without any extraordinary heroic measures." MD has been informed of HCPOA decision to rescind partial code status.

## 2019-08-26 NOTE — Progress Notes (Signed)
SLP Cancellation Note  Patient Details Name: Heather Newman MRN: [id number] DOB: [birth year]   Cancelled treatment:       Reason Eval/Treat Not Completed: Fatigue/lethargy limiting ability to participate; per Rapid Response nurse, there are concerns for potential aspiration.  Pt failed [facility] swallow screen; there are no orders for SLP swallow yet, however, she is not sufficiently alert for assessment at this time. SLP will follow for readiness.  Galya Dunnigan L. Tivis Ringer, [place] Office number [phone] Pager [phone]    Assunta Curtis 08/26/2019, 4:51 PM

## 2019-08-26 NOTE — Progress Notes (Signed)
Pt arrives in afib RVR  BP= 145/107 with audible rattles and end tidal = 29 Withdraws from pain PERRL Rapid response RN notified as well as CCM and neurology  NTS done by RRRN improves spO2 No more rattles, R lung sounds diminished, L lung sounds were absent but returned after xopenex  Pt's daughter called RN after speaking with [facility] providers; she is requesting that pt be tx back to [facility] for ease of family visitation. Pt transitioned to comfort care; social work currently assisting with hospice bed search in proximity to family.  Pt transferred back to [facility] 313 at shift change

## 2019-08-26 NOTE — Consult Note (Addendum)
NEUROLOGY CONSULT  Reason for Consult: stroke; obtunded Referring Physician: Dr. Tery Sanfilippo  CC: unable d/t clinical condition  HPI: Heather Newman is an 79 y.o. female who resides in a SNF (baseline mRS 5 as she has been bed-bound) with PMH of Alzheimers demntia, CAD, CHF, COPD, depression, HTN, HLD, GERD, hepatic cirrhosis, DM2, OSA, Afib not on [place] d/t recnet GIB. She was recently admitted at [facility] and has had several admits for hepatic encephalopathy. On 5/29 she was readmitted again with AMS. Ammonia was 153, lactulose was started and this is now down to 53 today. CTH was done as part of AMS wk up, which showed acute stroke in the right internal/ext capsule. Neurology was consulted for stroke wk up. Upon entering room, RRT at bedside suctioning pt as she is aspirating on her own secreations. She is obtunded, doesn't follow commands and is densely paretic on left. Pt is partial code, no intubation confirmed, and attempt to keep out of ICU with supportive care. HCPOA wants CPR done in case of cardiac arrest.   ROS: unable d/t clinical condition  Past Medical History Past Medical History:  Diagnosis Date  . Alzheimer's disease ([place])   . 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 Bilateral primary osteoarthritis of knee   . CAD (coronary artery disease)   . Calculus of ureter   . CHF (congestive heart failure) ([facility])   . COPD (chronic obstructive pulmonary disease) ([place])   . Depression   . Displaced spiral fracture of shaft of left femur, subsequent encounter for closed fracture with routine healing   . Essential hypertension   . GERD (gastroesophageal reflux disease)   . Hepatic cirrhosis ([place])   . Hyperlipidemia   . LBBB (left bundle branch block)   . Melanocytic nevi, unspecified   . OSA (obstructive sleep apnea)   . PAF (paroxysmal atrial fibrillation) ([place])   . Type 2 diabetes mellitus ([place])   . Vitamin D deficiency     Past  Surgical History Past Surgical History:  Procedure Laterality Date  . CHOLECYSTECTOMY    . SPINE SURGERY    . STRABISMUS SURGERY      Family History Family History  Problem Relation Age of Onset  . Diabetes Mellitus II Mother   . Pneumonia Father     Social History    reports that she has been smoking cigarettes. She has never used smokeless tobacco. She reports that she does not drink alcohol or use drugs.  Allergies No Known Allergies  Home Medications Medications Prior to Admission  Medication Sig Dispense Refill  . acetaminophen (TYLENOL) 325 MG tablet Take 325-650 mg by mouth every 6 (six) hours as needed for mild pain or moderate pain.    Marland Kitchen albuterol (PROVENTIL) (2.5 MG/3ML) 0.083% nebulizer solution Take 3 mLs (2.5 mg total) by nebulization every 6 (six) hours as needed for wheezing or shortness of breath. 75 mL 3  . aspirin EC 81 MG EC tablet Take 1 tablet (81 mg total) by mouth daily with breakfast. 30 tablet 3  . diltiazem (CARDIZEM CD) 240 MG 24 hr capsule Take 1 capsule (240 mg total) by mouth daily. For Heart 30 capsule 2  . famotidine (PEPCID) 20 MG tablet Take 1 tablet (20 mg total) by mouth 2 (two) times daily.    . fluticasone (FLONASE) 50 MCG/ACT nasal spray Place 1 spray into both nostrils daily.    . Fluticasone-Salmeterol (ADVAIR) 100-50 MCG/DOSE AEPB Inhale  1 puff into the lungs 2 (two) times daily.    . furosemide (LASIX) 20 MG tablet Take 1 tablet (20 mg total) by mouth daily. 30 tablet 1  . gabapentin (NEURONTIN) 100 MG capsule Take 2 capsules (200 mg total) by mouth 2 (two) times daily. 60 capsule 3  . guaiFENesin (MUCINEX) 600 MG 12 hr tablet Take 1 tablet (600 mg total) by mouth 2 (two) times daily.    . hydrocortisone (ANUSOL-HC) 2.5 % rectal cream Place rectally 2 (two) times daily for 10 days. Use for 10 days then okay to stop 30 g 0  . insulin aspart (NOVOLOG) 100 UNIT/ML FlexPen Inject 0-6 Units into the skin See admin instructions. insulin aspart  (novoLOG) injection 0- 6 Units 0- 6 Units Subcutaneous, 3 times daily with meals CBG < 70: Implement Hypoglycemia Standing Orders, -- CBG 70 - 120: 0 unit CBG 121 - 150: 0 unit  CBG 151 - 200: 0 unit CBG 201 - 250: 1 units CBG 251 - 300: 2 units CBG 301 - 350: 4 units  CBG 351 - 400: 6 units  CBG > 400: 6 units 15 mL 11  . Insulin Glargine (BASAGLAR KWIKPEN) 100 UNIT/ML Inject 10 Units into the skin at bedtime.     . Insulin Lispro (ADMELOG SOLOSTAR [place]) Inject 4 Units into the skin 3 (three) times daily before meals.     Marland Kitchen ipratropium-albuterol (DUONEB) 0.5-2.5 (3) MG/3ML SOLN Take 3 mLs by nebulization 2 (two) times daily. 360 mL 3  . lactulose (CHRONULAC) 10 GM/15ML solution Take 30 mLs (20 g total) by mouth 2 (two) times daily. Goal is to have 2-3 mushy stools per day-avoid constipation and avoid frank watery diarrhea type stool 473 mL 3  . meclizine (ANTIVERT) 25 MG tablet Take 25 mg by mouth 3 (three) times daily as needed for dizziness.    . metFORMIN (GLUCOPHAGE) 500 MG tablet Take 1 tablet (500 mg total) by mouth 2 (two) times daily with a meal. 30 tablet 1  . mirtazapine (REMERON) 7.5 MG tablet Take 1 tablet (7.5 mg total) by mouth at bedtime. 30 tablet 1  . nystatin (MYCOSTATIN/NYSTOP) powder Apply topically 3 (three) times daily. Apply to affected areas---Groin and perineum 15 g 0  . nystatin-triamcinolone (MYCOLOG II) cream Apply 1 application topically 2 (two) times daily. Apply to buttocks--rash 30 g 0  . Potassium Chloride ER 20 MEQ TBCR Take 20 mEq by mouth every Tuesday, Thursday, Saturday, and Sunday. -take potassium supplements while having BMs with Lactulose 30 tablet 1  . rifaximin (XIFAXAN) 550 MG TABS tablet Take 1 tablet (550 mg total) by mouth 2 (two) times daily. 60 tablet 3  . rosuvastatin (CRESTOR) 5 MG tablet Take 5 mg by mouth daily.    . traMADol (ULTRAM) 50 MG tablet Take 1 tablet (50 mg total) by mouth 2 (two) times daily as needed for moderate pain. 15 tablet 0  .  Vitamin D, Ergocalciferol, (DRISDOL) 1.25 MG (50000 UNIT) CAPS capsule Take 1 capsule (50,000 Units total) by mouth every 7 (seven) days. (Patient taking differently: Take 50,000 Units by mouth every Monday. ) 4 capsule 2  . metoprolol succinate (TOPROL XL) 50 MG 24 hr tablet Take 1 tablet (50 mg total) by mouth daily. (Patient not taking: Reported on 08/25/2019) 30 tablet 3    Hospital Medications . aspirin EC  81 mg Oral Q breakfast  . diltiazem  240 mg Oral Daily  . famotidine  20 mg Oral BID  .  fluticasone  1 spray Each Nare Daily  . fluticasone furoate-vilanterol  1 puff Inhalation Daily  . furosemide  20 mg Oral Daily  . heparin  5,000 Units Subcutaneous Q8H  . insulin aspart  0-9 Units Subcutaneous TID WC  . insulin glargine  10 Units Subcutaneous QHS  . ipratropium-albuterol  3 mL Nebulization BID  . lactulose  300 mL Rectal Q8H  . metoprolol tartrate  5 mg Intravenous Q6H  . mirtazapine  7.5 mg Oral QHS  . nystatin   Topical TID  . rifaximin  550 mg Oral BID  . rosuvastatin  5 mg Oral QPM  . [START ON 08/27/2019] Vitamin D (Ergocalciferol)  50,000 Units Oral Q Mon     ROS: Pt unable d/t clinical exam; dementia; AMS  Physical Examination:  Vitals:   08/26/19 0423 08/26/19 0615 08/26/19 0731 08/26/19 1254  BP: 118/83 (!) 152/66  (!) 155/95  Pulse: (!) 117 (!) 116  61  Resp: 20 18  16   Temp: 97.6 F (36.4 C) 97.8 F (36.6 C)  98.1 F (36.7 C)  TempSrc:  Oral  Oral  SpO2: 100% 100% 98% 99%  Weight:      Height:        General - frail, elderly, severe distress Heart - Regular rate and rhythm - no murmer Lungs - gurgling on secretions, weak cough, respiratory distress Abdomen - Soft - non tender Extremities - Distal pulses intact - no edema Skin - Warm and dry  Neurologic Examination:   Mental Status:  Obtunded, only opened eyes to deep noxious sternal rub. Did not follow commands or attempt to speak. Moans only to pain Cranial Nerves:  Slight dysconjugate  gaze, did not track. PERRL, 31mm. Dense left facial droop.  Motor: There is some movement against gravity on right arm/leg to noxious stimuli. Left has desne hemipresis Sensory: dense, total loss on left Plantars: up on right, mute on left  Cerebellar: unable Gait: not tested NIHSS 1a Level of Conscious.: 2 1b LOC Questions: 2 1c LOC Commands: 2 2 Best Gaze: 0 3 Visual: 0 4 Facial Palsy: 3 5a Motor Arm - Left:4 5b Motor Arm - Right: 2 6a Motor Leg - Left: 4 6b Motor Leg - Right: 2 7 Limb Ataxia: 0 8 Sensory: 2 9 Best Language: 3 10 Dysarthria: 2 11 Extinct. and Inatten.: 0 TOTAL: 28  LABORATORY STUDIES:  Basic Metabolic Panel: Recent Labs  Lab 08/20/19 0430 08/25/19 1155 08/26/19 0757  NA 139 141 142  K 2.9* 4.3 4.7  CL 99 101 105  CO2 30 30 22   GLUCOSE 114* 148* 199*  BUN 12 20 28*  CREATININE 0.44 0.63 0.80  CALCIUM 8.4* 9.5 9.9  MG 1.6*  --   --     Liver Function Tests: Recent Labs  Lab 08/20/19 0430 08/25/19 1155 08/26/19 0757  AST 17 27 32  ALT 13 19 20   ALKPHOS 79 115 126  BILITOT 2.0* 1.3* 1.6*  PROT 5.3* 6.8 7.4  ALBUMIN 2.3* 3.1* 3.2*   No results for input(s): LIPASE, AMYLASE in the last 168 hours. Recent Labs  Lab 08/20/19 0430 08/25/19 1155 08/26/19 0755  AMMONIA 59* 152* 53*    CBC: Recent Labs  Lab 08/20/19 0430 08/25/19 1155  WBC 10.0 9.2  HGB 10.8* 14.2  HCT 33.9* 44.7  MCV 100.9* 100.4*  PLT 281 421*    Cardiac Enzymes: No results for input(s): CKTOTAL, CKMB, CKMBINDEX, TROPONINI in the last 168 hours.  BNP: Invalid input(s):  POCBNP  CBG: Recent Labs  Lab 08/25/19 1854 08/25/19 2214 08/26/19 0353 08/26/19 0740 08/26/19 1124  GLUCAP 138* 169* 182* 168* 185*    Microbiology:   Coagulation Studies: No results for input(s): LABPROT, INR in the last 72 hours.  Urinalysis: No results for input(s): COLORURINE, LABSPEC, PHURINE, GLUCOSEU, HGBUR, BILIRUBINUR, KETONESUR, PROTEINUR, UROBILINOGEN, NITRITE,  LEUKOCYTESUR in the last 168 hours.  Invalid input(s): APPERANCEUR  Lipid Panel:  No results found for: CHOL, TRIG, HDL, CHOLHDL, VLDL, LDLCALC  HgbA1C:  Lab Results  Component Value Date   HGBA1C 8.0 (H) 08/04/2019    Urine Drug Screen:  No results found for: LABOPIA, COCAINSCRNUR, LABBENZ, AMPHETMU, THCU, LABBARB   Alcohol Level:  No results for input(s): ETH in the last 168 hours.  Miscellaneous labs:  EKG  EKG  IMAGING: CT HEAD WO CONTRAST  Result Date: 08/26/2019 CLINICAL DATA:  Unresponsive/altered mental status EXAM: CT HEAD WITHOUT CONTRAST TECHNIQUE: Contiguous axial images were obtained from the base of the skull through the vertex without intravenous contrast. COMPARISON:  None. FINDINGS: Brain: There is mild diffuse atrophy. There is decreased attenuation involving the right lentiform nucleus both anteriorly and posteriorly as well as the right internal, external, and extreme capsules. Claustrum does not appear involved with this apparent acute infarct on the right. There is no focal mass or hemorrhage. There is no extra-axial fluid collection, or midline shift. Elsewhere there is slight small vessel disease in the centra semiovale bilaterally. Vascular: No hyperdense vessel. There is arterial vascular calcification in each distal vertebral artery as well as in the carotid siphon regions bilaterally. Skull: Bony calvarium appears intact. Sinuses/Orbits: There is mucosal thickening in multiple ethmoid air cells. Other visualized paranasal sinuses are clear. Orbits appear symmetric bilaterally. Other: Visualized mastoid air cells are clear. IMPRESSION: 1. Recent and likely acute infarct involving the right lentiform nucleus as well as the right internal, external, and extreme capsule regions. Localized cytotoxic edema in these areas. 2. There is mild atrophy with mild periventricular small vessel disease. No mass or hemorrhage evident. 3.  There are foci of arterial vascular  calcification. 4.  There is mucosal thickening in several ethmoid air cells. These results will be called to the ordering clinician or representative by the Radiologist Assistant, and communication documented in the PACS or [employer]. Electronically Signed   By: Lowella Grip III M.D.   On: 08/26/2019 10:03     Assessment/Plan: 79yr old lady who is very debilitated and bed-bound (mRS 5) at baseline from SNF with dementia and multiple admits for hepatic encephalopathy. NH4 153, lactulose started. CTH showed acute (within 24-48h) stroke, no known time of onset given severe encephalopathy since admit. No acute stroke treatments available to her. She has been off [place] for Afib d/t GIB.  1. Acute Ischemic Stroke- significant neurologic deficits due to small stroke due to location in the Rt internal capsule.   -Tele: Afib RVR  -Echo: 55% LA severe dilation, RA mod dilation  -MRI, CTA pending  -LDL: pending 2. Afib RVR- off OAC d/t GIB. ASA ok for now 3. Dense Lt hemiparesis, dysphagia, mute global aphasia- d/t above 4. DM2- A1C was 8.0 last admit. SSI 5. Toxic metabolic encephalopathy, obtunded- between long standing hepatic disease and hyperammoniemia, current stroke, active aspiration, infection and baseline poor neurologic reserve in setting of dementia and severe debility at baseline.  6. Alzheimer's dementia- severe debility from SNF   PLAN: Pt seen with RRT at bedside and primary team updated Supportive care, suctioning  as pt cannot protect airway. Confirmed DNI with family on phone; however, they do want CPR. Keep NPO CTA head/neck and MRI to complete stroke wk up, but do not think pt is stable enough for this tonight.  Lipids added No need to repeat A1c Rehab if stable, pending GOC Pending on [place] discussion tomorrow with family, we may d/c further stroke wk up as above. Pts granddaughter is HCPOA and has severe anxiety and is in great distress over making further current  decisions right now.  She is at high risk for M&M  Attending neurologist's note to follow  Desiree Metzger-Cihelka, ARNP-C, ANVP-BC Pager: [phone]   NEUROHOSPITALIST ADDENDUM Performed a face to face diagnostic evaluation.   I have reviewed the contents of history and physical exam as documented by PA/ARNP/Resident and agree with above documentation.  I have discussed and formulated the above plan as documented. Edits to the note have been made as needed.  79 year old female who lives in a nursing home who has been bedbound due to history of dementia, also has hepatic cirrhosis with hepatic encephalopathy as well as multiple other vascular risk factors including A. fib not on anticoagulation due to recent GI bleed.  Presented to [facility] with hepatic encephalopathy and ammonia of 153.  Patient noted to be weak on the left side and therefore CT head was performed which showed a hypodensity in the right internal capsule suggestive of acute stroke.  Patient transferred to [facility] for further stroke evaluation.  On arrival to [facility], patient obtunded and aspirating on her own secretions.  Opens eyes to noxious stimulus.  Does not follow any commands.  Plegic in the left.  Family called due to concern patient is not protecting her airway.  Hospitalist discussed in detail the patient condition and emergent need to intubate for airway protection versus DNI/DNR.  Neurology also provided input.  Family called back to the hospitalist and it appears that they decided to pursue comfort care-which I think is appropriate for 79 year old female with dementia, bedbound at baseline with cirrhosis and hepatic encephalopathy, GI bleed and and now a new stroke resulting in left-sided paralysis.  Neurology will be evaluated as needed    Karena Addison Elesha Thedford MD [facility] [phone]   If 7pm to 7am, please call on call as listed on AMION.

## 2019-08-26 NOTE — Progress Notes (Signed)
   08/26/19 0615  Assess: MEWS Score  Temp 97.8 F (36.6 C)  BP (!) 152/66  Pulse Rate (!) 116  Resp 18  SpO2 100 %  O2 Device Room Air  Assess: MEWS Score  MEWS Temp 0  MEWS Systolic 0  MEWS Pulse 2  MEWS RR 0  MEWS LOC 1  MEWS Score 3  MEWS Score Color Yellow   Pt heart rate sustaining 115 to 120's, afib. Dr. Darrick Meigs notified. New order for Cardizem 10mg  IV once placed and given.

## 2019-08-26 NOTE — Significant Event (Signed)
Rapid Response Event Note  Overview: Time Called: 1524 Arrival Time: 1526 Event Type: Neurologic, Respiratory, Cardiac  Patient has just arrived from [place] at bedside upon my arrival.  Per Report she has been hospitalized with hepatic encephalopathy Ammonia level 53 this morning.  She has also had an acute CVA and I not moving her left side  Initial Focused Assessment: Upon arrival she is obtunded. She will move her right arm purposefully to deep painful stimulus.  After significant stimulation she does moan.  Pupils 3/reactive Lung sounds with rhonchi through out and tachypnea.    145/107  AF 130-150s  RR 26-36  O2 sat 91% on 2L [place] Temp 97.2  Interventions: Patient positioned in high fowlers Oral suction and oral care done 5mg  Lopressor given IV  For AF 130-150s  Dr Sloan Leiter at bedside to assess patient NT suctioned,  White frothy secretions.   Lungs sounds decreased on left then sounded "tight" through out. Xopenex nebulizer given Lung sounds clear ABG done  7.39/38.9/84.2/23.3  Dr Aroor at bedside to assess patient, she did open her eyes to deep painful stimuli, she does not follow commands.  Dr Sloan Leiter spoke with family via phone,  She is currently a limited code, do not intubate.  BP 107/88  AF 120-140  RR 22 O2 sat 98% on 4L [place]  Dr Sloan Leiter spoke with family again.  Patient is now full comfort.  Plan of Care (if not transferred): Transfer to [facility] or Hospice house  Event Summary: Name of Physician Notified: Dr Sloan Leiter at 1526  Name of Consulting Physician Notified: Dr Lorraine Lax and [place] Cerise NP at       Event End Time: [place]  Raliegh Ip

## 2019-08-26 NOTE — H&P (Signed)
error 

## 2019-08-26 NOTE — Progress Notes (Signed)
   08/26/19 0423  Assess: MEWS Score  Temp 97.6 F (36.4 C)  BP 118/83  Pulse Rate (!) 117  Resp 20  Level of Consciousness Responds to Voice  SpO2 100 %  O2 Device Nasal Cannula  O2 Flow Rate (L/min) 2 L/min  Assess: MEWS Score  MEWS Temp 0  MEWS Systolic 0  MEWS Pulse 2  MEWS RR 0  MEWS LOC 1  MEWS Score 3  MEWS Score Color Yellow    Patients Heart rate sustaining 115-125, afib on ECG monitor. Patient responds to voice, mute. Unable to follow commands. MD notified. New orders placed.

## 2019-08-26 NOTE — Progress Notes (Addendum)
Family called back-they have spoken with Neurology-they understand poor prognoses-apparently till a few days back-patient was clear that she did not aggressive care (CPR etc) if she suffered a catastrophic illness. Thy want to transition to full comfort measures-are requesting that we transfer to [facility] no beds available-then they ask that patient be transferred to [facility]. Spoke with SW-who will see if hospice has a bed-if not Dr Irwin Brakeman accepts her back to his service at [facility].  Full comfort measures orders placed-starting Morphine gtt for comfort.   VERY poor prognoses, suspect life expectancy to be a few days at best.  Family aware that there is a small chance that patient may pass while at [facility] before being moved to hospice or back to [facility].

## 2019-08-27 DIAGNOSIS — I10 Essential (primary) hypertension: Secondary | ICD-10-CM

## 2019-08-27 LAB — SARS CORONAVIRUS 2 BY RT PCR (HOSPITAL ORDER, PERFORMED IN ~~LOC~~ HOSPITAL LAB): SARS Coronavirus 2: NEGATIVE

## 2019-08-27 MED ORDER — KETOROLAC TROMETHAMINE 15 MG/ML IJ SOLN
15.0000 mg | Freq: Once | INTRAMUSCULAR | Status: AC
  Administered 2019-08-27: 15 mg via INTRAVENOUS
  Filled 2019-08-27: qty 1

## 2019-08-27 NOTE — Discharge Summary (Signed)
Physician Discharge Summary  Heather Newman [id number] DOB: 1940-05-28 DOA: 08/25/2019  PCP: Patient, No Pcp Per  Admit date: 08/25/2019 Discharge date: 08/27/2019  Disposition: Hospice   Recommendations   SYMPTOM MANAGEMENT PER HOSPICE PROTOCOL   Discharge Condition: HOSPICE   CODE STATUS: DNR    Brief Hospitalization Summary: Please see all hospital notes, images, labs for full details of the hospitalization. HPI: Heather Newman is a 79 y.o. female with known Alzheimer's dementia who is a resident at [facility] who recently was discharged from the hospital several days ago from a hospitalization for hepatic encephalopathy where she was treated with lactulose and started on rifaximin and had improved with treatments.  She was discharged on lactulose and rifaximin and unfortunately she was transferred back to the hospital today because of altered mental status and obtundation.  The patient is unable to provide history due to her current state.  Records from the facility reports that she had been given the lactulose.  Unable to determine what type of stool output she was having.  The goal had been to have her take lactulose to achieve goal of 3 stools per day.  Her lactulose had come down to 59 prior to discharge from hospital on 5/24 and mentation had improved.   Unfortunately the patient has had multiple hospitalizations this year for respiratory failure and hepatic encephalopathy.  She was noted to have a markedly elevated ammonia level of 159.  This is higher than it was during last admission.  She was started on a lactulose enema in the emergency department and started on gentle IV fluid hydration and admission was requested.  She was partial code.  No CPR and no intubation.  Brief Admission History:  79 y.o.femalewithknown Alzheimer's dementia who is a resident at [facility] who recently was discharged from the hospital several days ago from a hospitalization for hepatic  encephalopathy where she was treated with lactulose and started on rifaximin and had improved with treatments. She was discharged on lactulose and rifaximin and unfortunately shewas transferred back to the hospital today because of altered mental status and obtundation.   Unfortunately the patient has had multiple hospitalizations this year for respiratory failure and hepatic encephalopathy. She was noted to have a markedly elevated ammonia level of 159. This is higher than it was during last admission. She was started on a lactulose enema in the emergency department and started on gentle IV fluid hydration and admission was requested.  Pt developed neuro changes on 5/30 in AM and CT head with findings of acute CVA involving the right internal capsule. Efforts were made to transfer to [facility] for neurology consult and MRI brain however patient further decompensated en route to [facility] and after arrival and neurology evaluation they felt that there her neurological insult was severe and progressing and there would be no meaningful recovery. Family at that time consented to full comfort care measures and patient was transferred back to [facility] for full comfort care.    Assessment & Plan:   Principal Problem:   Acute CVA (cerebrovascular accident) ([place]) Active Problems:   Altered mental status, unspecified   Chronic systolic (congestive) heart failure (HCC)   Dementia in other diseases classified elsewhere without behavioral disturbance (HCC)   Emphysema, unspecified ([place])   Acute metabolic encephalopathy   Alzheimer's disease, unspecified (CODE) ([place])   Coronary artery disease of native artery of native heart with stable angina pectoris ([place])   Bilateral primary osteoarthritis of knee   Circadian rhythm sleep  disorder, advanced sleep phase type   Other cirrhosis of liver (HCC)   Diabetes mellitus type 2 with complications ([place])   Pressure injury of left heel, stage 2 (HCC)   Pressure injury of heel,  stage 2 (HCC)   CAD (coronary artery disease)   Essential hypertension   Hepatic encephalopathy ([place])   Hyperammonemia ([place])   DNR (do not resuscitate)  1. Hepatic encephalopathy -family reports they have not been giving patient lactulose regularly at the skilled nursing facility. She presented with a markedly elevated ammonia level but it has come down with lactulose enemas.  2. Acute CVA - CT confirms acute right internal capsule and lentiform nucleus CVA. Unfortunately during transport to [facility] her CVA symptoms progressed and after being seen by neurology it was felt that comfort care was most appropriate treatment.  Pt was returned to [facility] on full comfort care.    3. COPD/emphysema-bronchodilators from home resume. 4. CAD-resume home medications. 5. Dementia-Full Comfort measures.  6. Hyperammonemia-treaed with lactulose.  7. Stage II sacral breakdown-full comfort measures.  8. Chronic atrial fibrillation - Pt had NOT been on anticoagulation due to history of GI bleeding.Full comfort measures.  9. Stage II pressure injury of the heels-full comfort.  10. DNR :Full Comfort Care.   Discharge Diagnoses:  Principal Problem:   Acute CVA (cerebrovascular accident) ([place]) Active Problems:   Altered mental status, unspecified   Chronic systolic (congestive) heart failure (HCC)   Dementia in other diseases classified elsewhere without behavioral disturbance (HCC)   Emphysema, unspecified (HCC)   Acute metabolic encephalopathy   Alzheimer's disease, unspecified (CODE) ([place])   Coronary artery disease of native artery of native heart with stable angina pectoris ([place])   Bilateral primary osteoarthritis of knee   Circadian rhythm sleep disorder, advanced sleep phase type   Other cirrhosis of liver ([place])   Diabetes mellitus type 2 with complications (HCC)   Pressure injury of left heel, stage 2 (HCC)   Pressure injury of heel, stage 2 (HCC)   CAD (coronary artery disease)   Essential  hypertension   Hepatic encephalopathy ([place])   Hyperammonemia ([place])   DNR (do not resuscitate)   Discharge Instructions:  Allergies as of 08/27/2019   No Known Allergies     Medication List    STOP taking these medications   acetaminophen 325 MG tablet Commonly known as: TYLENOL   ADMELOG SOLOSTAR [place]   albuterol (2.5 MG/3ML) 0.083% nebulizer solution Commonly known as: PROVENTIL   aspirin 81 MG EC tablet   Basaglar KwikPen 100 UNIT/ML   diltiazem 240 MG 24 hr capsule Commonly known as: CARDIZEM CD   famotidine 20 MG tablet Commonly known as: PEPCID   fluticasone 50 MCG/ACT nasal spray Commonly known as: FLONASE   Fluticasone-Salmeterol 100-50 MCG/DOSE Aepb Commonly known as: ADVAIR   furosemide 20 MG tablet Commonly known as: LASIX   gabapentin 100 MG capsule Commonly known as: NEURONTIN   guaiFENesin 600 MG 12 hr tablet Commonly known as: MUCINEX   hydrocortisone 2.5 % rectal cream Commonly known as: ANUSOL-HC   insulin aspart 100 UNIT/ML FlexPen Commonly known as: NOVOLOG   ipratropium-albuterol 0.5-2.5 (3) MG/3ML Soln Commonly known as: DUONEB   lactulose 10 GM/15ML solution Commonly known as: CHRONULAC   meclizine 25 MG tablet Commonly known as: ANTIVERT   metFORMIN 500 MG tablet Commonly known as: GLUCOPHAGE   metoprolol succinate 50 MG 24 hr tablet Commonly known as: Toprol XL   mirtazapine 7.5 MG tablet Commonly known as: REMERON  nystatin powder Commonly known as: MYCOSTATIN/NYSTOP   nystatin-triamcinolone cream Commonly known as: MYCOLOG II   Potassium Chloride ER 20 MEQ Tbcr   rifaximin 550 MG Tabs tablet Commonly known as: XIFAXAN   rosuvastatin 5 MG tablet Commonly known as: CRESTOR   traMADol 50 MG tablet Commonly known as: ULTRAM   Vitamin D (Ergocalciferol) 1.25 MG (50000 UNIT) Caps capsule Commonly known as: DRISDOL       No Known Allergies Allergies as of 08/27/2019   No Known Allergies     Medication  List    STOP taking these medications   acetaminophen 325 MG tablet Commonly known as: TYLENOL   ADMELOG SOLOSTAR [place]   albuterol (2.5 MG/3ML) 0.083% nebulizer solution Commonly known as: PROVENTIL   aspirin 81 MG EC tablet   Basaglar KwikPen 100 UNIT/ML   diltiazem 240 MG 24 hr capsule Commonly known as: CARDIZEM CD   famotidine 20 MG tablet Commonly known as: PEPCID   fluticasone 50 MCG/ACT nasal spray Commonly known as: FLONASE   Fluticasone-Salmeterol 100-50 MCG/DOSE Aepb Commonly known as: ADVAIR   furosemide 20 MG tablet Commonly known as: LASIX   gabapentin 100 MG capsule Commonly known as: NEURONTIN   guaiFENesin 600 MG 12 hr tablet Commonly known as: MUCINEX   hydrocortisone 2.5 % rectal cream Commonly known as: ANUSOL-HC   insulin aspart 100 UNIT/ML FlexPen Commonly known as: NOVOLOG   ipratropium-albuterol 0.5-2.5 (3) MG/3ML Soln Commonly known as: DUONEB   lactulose 10 GM/15ML solution Commonly known as: CHRONULAC   meclizine 25 MG tablet Commonly known as: ANTIVERT   metFORMIN 500 MG tablet Commonly known as: GLUCOPHAGE   metoprolol succinate 50 MG 24 hr tablet Commonly known as: Toprol XL   mirtazapine 7.5 MG tablet Commonly known as: REMERON   nystatin powder Commonly known as: MYCOSTATIN/NYSTOP   nystatin-triamcinolone cream Commonly known as: MYCOLOG II   Potassium Chloride ER 20 MEQ Tbcr   rifaximin 550 MG Tabs tablet Commonly known as: XIFAXAN   rosuvastatin 5 MG tablet Commonly known as: CRESTOR   traMADol 50 MG tablet Commonly known as: ULTRAM   Vitamin D (Ergocalciferol) 1.25 MG (50000 UNIT) Caps capsule Commonly known as: DRISDOL       Procedures/Studies: CT HEAD WO CONTRAST  Result Date: 08/26/2019 CLINICAL DATA:  Unresponsive/altered mental status EXAM: CT HEAD WITHOUT CONTRAST TECHNIQUE: Contiguous axial images were obtained from the base of the skull through the vertex without intravenous contrast.  COMPARISON:  None. FINDINGS: Brain: There is mild diffuse atrophy. There is decreased attenuation involving the right lentiform nucleus both anteriorly and posteriorly as well as the right internal, external, and extreme capsules. Claustrum does not appear involved with this apparent acute infarct on the right. There is no focal mass or hemorrhage. There is no extra-axial fluid collection, or midline shift. Elsewhere there is slight small vessel disease in the centra semiovale bilaterally. Vascular: No hyperdense vessel. There is arterial vascular calcification in each distal vertebral artery as well as in the carotid siphon regions bilaterally. Skull: Bony calvarium appears intact. Sinuses/Orbits: There is mucosal thickening in multiple ethmoid air cells. Other visualized paranasal sinuses are clear. Orbits appear symmetric bilaterally. Other: Visualized mastoid air cells are clear. IMPRESSION: 1. Recent and likely acute infarct involving the right lentiform nucleus as well as the right internal, external, and extreme capsule regions. Localized cytotoxic edema in these areas. 2. There is mild atrophy with mild periventricular small vessel disease. No mass or hemorrhage evident. 3.  There are foci of  arterial vascular calcification. 4.  There is mucosal thickening in several ethmoid air cells. These results will be called to the ordering clinician or representative by the Radiologist Assistant, and communication documented in the PACS or [employer]. Electronically Signed   By: Lowella Grip III M.D.   On: 08/26/2019 10:03   DG Chest Port 1 View  Result Date: 08/17/2019 CLINICAL DATA:  Weakness and elevated ammonia level EXAM: PORTABLE CHEST 1 VIEW COMPARISON:  08/04/2019 FINDINGS: Cardiac shadow is enlarged but stable. Aortic calcifications are again seen. The lungs are well aerated bilaterally. Mild vascular congestion is again noted centrally. No edema is seen. No focal infiltrate is noted. Mild  scarring at the left base is noted. IMPRESSION: Chronic changes without acute abnormality. Electronically Signed   By: Inez Catalina M.D.   On: 08/17/2019 15:39   DG Chest Port 1 View  Result Date: 08/04/2019 CLINICAL DATA:  Fever, rectal bleeding EXAM: PORTABLE CHEST 1 VIEW COMPARISON:  07/15/2019 FINDINGS: Single frontal view of the chest demonstrates an enlarged cardiac silhouette, stable. Chronic central vascular congestion without airspace disease, effusion, or pneumothorax. No acute bony abnormalities. IMPRESSION: 1. Stable chest, no acute process. Electronically Signed   By: Randa Ngo M.D.   On: 08/04/2019 03:35   DG Chest Port 1V same Day  Result Date: 08/26/2019 CLINICAL DATA:  Acute shortness of breath EXAM: PORTABLE CHEST 1 VIEW COMPARISON:  08/17/2019 FINDINGS: Cardiomegaly again identified. Mild peribronchial thickening and mild LEFT basilar scarring again noted. There is no evidence of focal airspace disease, pulmonary edema, suspicious pulmonary nodule/mass, pleural effusion, or pneumothorax. No acute bony abnormalities are identified. IMPRESSION: Cardiomegaly without evidence of acute cardiopulmonary disease. Electronically Signed   By: Margarette Canada M.D.   On: 08/26/2019 16:41   US Abdomen Limited RUQ  Result Date: 08/18/2019 CLINICAL DATA:  Cirrhosis of the liver. EXAM: ULTRASOUND ABDOMEN LIMITED RIGHT UPPER QUADRANT COMPARISON:  None. FINDINGS: Gallbladder: Post cholecystectomy.  No complicating features. Common bile duct: Diameter: 8.7 mm, partially obscured by bowel gas. Liver: Heterogeneous increased echogenicity with lobulated borders and atrophic appearance. Portal vein is patent on color Doppler imaging with normal direction of blood flow towards the liver. 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 Other: None. 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 Normal direction of flow of the main portal vein.  Electronically Signed   By: Fidela Salisbury M.D.   On: 08/18/2019 12:52      Subjective: Pt appears comfortable.   Discharge Exam: Vitals:   08/26/19 2004 08/26/19 2005  BP: (!) 133/101   Pulse: (!) 113   Resp: 16   Temp: 98.9 F (37.2 C)   SpO2: 98% 98%   Vitals:   08/26/19 1656 08/26/19 1959 08/26/19 2004 08/26/19 2005  BP: 136/77  (!) 133/101   Pulse:   (!) 113   Resp: (!) 22  16   Temp:   98.9 F (37.2 C)   TempSrc:   Oral   SpO2: 97%  98% 98%  Weight:  74.6 kg    Height:  5\' 8"  (1.727 m)     General: Pt somnolent, appears comfortable, no apparent distress.  Cardiovascular: normal S1/S2 +, no rubs, no gallops Respiratory: BBS with anterior rales heard Abdominal: Soft, NT, ND, bowel sounds + Extremities: cool to touch   The results of significant diagnostics from this hospitalization (including imaging, microbiology, ancillary and laboratory) are listed below for reference.  Microbiology: Recent Results (from the past 240 hour(s))  SARS Coronavirus 2 by RT PCR (hospital order, performed in [facility] hospital lab) Nasopharyngeal Nasopharyngeal Swab     Status: None   Collection Time: 08/17/19  6:00 PM   Specimen: Nasopharyngeal Swab  Result Value Ref Range Status   SARS Coronavirus 2 NEGATIVE NEGATIVE Final    Comment: (NOTE) SARS-CoV-2 target nucleic acids are NOT DETECTED. The SARS-CoV-2 RNA is generally detectable in upper and lower respiratory specimens during the acute phase of infection. The lowest concentration of SARS-CoV-2 viral copies this assay can detect is 250 copies / mL. A negative result does not preclude SARS-CoV-2 infection and should not be used as the sole basis for treatment or other patient management decisions.  A negative result may occur with improper specimen collection / handling, submission of specimen other than nasopharyngeal swab, presence of viral mutation(s) within the areas targeted by this assay, and inadequate  number of viral copies (<250 copies / mL). A negative result must be combined with clinical observations, patient history, and epidemiological information. Fact Sheet for Patients:   StrictlyIdeas.no Fact Sheet for Healthcare Providers: BankingDealers.co.za This test is not yet approved or cleared  by the [place] FDA and has been authorized for detection and/or diagnosis of SARS-CoV-2 by FDA under an Emergency Use Authorization (EUA).  This EUA will remain in effect (meaning this test can be used) for the duration of the COVID-19 declaration under Section 564(b)(1) of the Act, 21 U.S.C. section 360bbb-3(b)(1), unless the authorization is terminated or revoked sooner. Performed at [facility], [address] [place], [place] [address]   Culture, blood (Routine X 2) w Reflex to ID Panel     Status: None   Collection Time: 08/18/19  5:47 PM   Specimen: BLOOD RIGHT FOREARM  Result Value Ref Range Status   Specimen Description BLOOD RIGHT FOREARM  Final   Special Requests   Final    BOTTLES DRAWN AEROBIC AND ANAEROBIC Blood Culture adequate volume   Culture   Final    NO GROWTH 5 DAYS Performed at [facility], [address], [place], [place] [address]    Report Status 08/23/2019 FINAL  Final  Culture, blood (Routine X 2) w Reflex to ID Panel     Status: None   Collection Time: 08/18/19  5:48 PM   Specimen: BLOOD LEFT HAND  Result Value Ref Range Status   Specimen Description BLOOD LEFT HAND  Final   Special Requests   Final    BOTTLES DRAWN AEROBIC ONLY Blood Culture adequate volume   Culture   Final    NO GROWTH 5 DAYS Performed at [facility], [address] [place],  [address]    Report Status 08/23/2019 FINAL  Final  SARS Coronavirus 2 by RT PCR (hospital order, performed in [facility] hospital lab) Nasopharyngeal Nasopharyngeal Swab     Status: None   Collection Time: 08/25/19 12:49 PM   Specimen: Nasopharyngeal  Swab  Result Value Ref Range Status   SARS Coronavirus 2 NEGATIVE NEGATIVE Final    Comment: (NOTE) SARS-CoV-2 target nucleic acids are NOT DETECTED. The SARS-CoV-2 RNA is generally detectable in upper and lower respiratory specimens during the acute phase of infection. The lowest concentration of SARS-CoV-2 viral copies this assay can detect is 250 copies / mL. A negative result does not preclude SARS-CoV-2 infection and should not be used as the sole basis for treatment or other patient management decisions.  A negative result may  occur with improper specimen collection / handling, submission of specimen other than nasopharyngeal swab, presence of viral mutation(s) within the areas targeted by this assay, and inadequate number of viral copies (<250 copies / mL). A negative result must be combined with clinical observations, patient history, and epidemiological information. Fact Sheet for Patients:   StrictlyIdeas.no Fact Sheet for Healthcare Providers: BankingDealers.co.za This test is not yet approved or cleared  by the [place] FDA and has been authorized for detection and/or diagnosis of SARS-CoV-2 by FDA under an Emergency Use Authorization (EUA).  This EUA will remain in effect (meaning this test can be used) for the duration of the COVID-19 declaration under Section 564(b)(1) of the Act, 21 U.S.C. section 360bbb-3(b)(1), unless the authorization is terminated or revoked sooner. Performed at [facility], [address], [place], [place] [address]      Labs: BNP (last 3 results) Recent Labs    05/13/19 0156 08/17/19 1528  BNP 696.0* 123XX123*   Basic Metabolic Panel: Recent Labs  Lab 08/25/19 1155 08/26/19 0757  NA 141 142  K 4.3 4.7  CL 101 105  CO2 30 22  GLUCOSE 148* 199*  BUN 20 28*  CREATININE 0.63 0.80  CALCIUM 9.5 9.9   Liver Function Tests: Recent Labs  Lab 08/25/19 1155 08/26/19 0757  AST 27 32   ALT 19 20  ALKPHOS 115 126  BILITOT 1.3* 1.6*  PROT 6.8 7.4  ALBUMIN 3.1* 3.2*   No results for input(s): LIPASE, AMYLASE in the last 168 hours. Recent Labs  Lab 08/25/19 1155 08/26/19 0755  AMMONIA 152* 53*   CBC: Recent Labs  Lab 08/25/19 1155  WBC 9.2  HGB 14.2  HCT 44.7  MCV 100.4*  PLT 421*   Cardiac Enzymes: No results for input(s): CKTOTAL, CKMB, CKMBINDEX, TROPONINI in the last 168 hours. BNP: Invalid input(s): POCBNP CBG: Recent Labs  Lab 08/25/19 2214 08/26/19 0353 08/26/19 0740 08/26/19 1124 08/26/19 1651  GLUCAP 169* 182* 168* 185* 166*   D-Dimer No results for input(s): DDIMER in the last 72 hours. Hgb A1c No results for input(s): HGBA1C in the last 72 hours. Lipid Profile No results for input(s): CHOL, HDL, LDLCALC, TRIG, CHOLHDL, LDLDIRECT in the last 72 hours. Thyroid function studies Recent Labs    08/25/19 1207  TSH 1.768   Anemia work up No results for input(s): VITAMINB12, FOLATE, FERRITIN, TIBC, IRON, RETICCTPCT in the last 72 hours. Urinalysis    Component Value Date/Time   COLORURINE YELLOW 08/17/2019 1525   APPEARANCEUR CLEAR 08/17/2019 1525   LABSPEC 1.014 08/17/2019 1525   PHURINE 7.0 08/17/2019 1525   GLUCOSEU NEGATIVE 08/17/2019 1525   HGBUR MODERATE (A) 08/17/2019 1525   BILIRUBINUR NEGATIVE 08/17/2019 [place] 08/17/2019 1525   PROTEINUR NEGATIVE 08/17/2019 1525   NITRITE NEGATIVE 08/17/2019 [place] 08/17/2019 1525   Sepsis Labs Invalid input(s): PROCALCITONIN,  WBC,  LACTICIDVEN Microbiology Recent Results (from the past 240 hour(s))  SARS Coronavirus 2 by RT PCR (hospital order, performed in [facility] hospital lab) Nasopharyngeal Nasopharyngeal Swab     Status: None   Collection Time: 08/17/19  6:00 PM   Specimen: Nasopharyngeal Swab  Result Value Ref Range Status   SARS Coronavirus 2 NEGATIVE NEGATIVE Final    Comment: (NOTE) SARS-CoV-2 target nucleic acids are NOT  DETECTED. The SARS-CoV-2 RNA is generally detectable in upper and lower respiratory specimens during the acute phase of infection. The lowest concentration of SARS-CoV-2 viral copies this assay  can detect is 250 copies / mL. A negative result does not preclude SARS-CoV-2 infection and should not be used as the sole basis for treatment or other patient management decisions.  A negative result may occur with improper specimen collection / handling, submission of specimen other than nasopharyngeal swab, presence of viral mutation(s) within the areas targeted by this assay, and inadequate number of viral copies (<250 copies / mL). A negative result must be combined with clinical observations, patient history, and epidemiological information. Fact Sheet for Patients:   StrictlyIdeas.no Fact Sheet for Healthcare Providers: BankingDealers.co.za This test is not yet approved or cleared  by the [place] FDA and has been authorized for detection and/or diagnosis of SARS-CoV-2 by FDA under an Emergency Use Authorization (EUA).  This EUA will remain in effect (meaning this test can be used) for the duration of the COVID-19 declaration under Section 564(b)(1) of the Act, 21 U.S.C. section 360bbb-3(b)(1), unless the authorization is terminated or revoked sooner. Performed at [facility], [address], [place], [place] [address]   Culture, blood (Routine X 2) w Reflex to ID Panel     Status: None   Collection Time: 08/18/19  5:47 PM   Specimen: BLOOD RIGHT FOREARM  Result Value Ref Range Status   Specimen Description BLOOD RIGHT FOREARM  Final   Special Requests   Final    BOTTLES DRAWN AEROBIC AND ANAEROBIC Blood Culture adequate volume   Culture   Final    NO GROWTH 5 DAYS Performed at [facility], [address], [place], [place] [address]    Report Status 08/23/2019 FINAL  Final  Culture, blood (Routine X 2) w Reflex to ID Panel      Status: None   Collection Time: 08/18/19  5:48 PM   Specimen: BLOOD LEFT HAND  Result Value Ref Range Status   Specimen Description BLOOD LEFT HAND  Final   Special Requests   Final    BOTTLES DRAWN AEROBIC ONLY Blood Culture adequate volume   Culture   Final    NO GROWTH 5 DAYS Performed at [facility], [address], [place], [place] [address]    Report Status 08/23/2019 FINAL  Final  SARS Coronavirus 2 by RT PCR (hospital order, performed in [facility] hospital lab) Nasopharyngeal Nasopharyngeal Swab     Status: None   Collection Time: 08/25/19 12:49 PM   Specimen: Nasopharyngeal Swab  Result Value Ref Range Status   SARS Coronavirus 2 NEGATIVE NEGATIVE Final    Comment: (NOTE) SARS-CoV-2 target nucleic acids are NOT DETECTED. The SARS-CoV-2 RNA is generally detectable in upper and lower respiratory specimens during the acute phase of infection. The lowest concentration of SARS-CoV-2 viral copies this assay can detect is 250 copies / mL. A negative result does not preclude SARS-CoV-2 infection and should not be used as the sole basis for treatment or other patient management decisions.  A negative result may occur with improper specimen collection / handling, submission of specimen other than nasopharyngeal swab, presence of viral mutation(s) within the areas targeted by this assay, and inadequate number of viral copies (<250 copies / mL). A negative result must be combined with clinical observations, patient history, and epidemiological information. Fact Sheet for Patients:   StrictlyIdeas.no Fact Sheet for Healthcare Providers: BankingDealers.co.za This test is not yet approved or cleared  by the [place] FDA and has been authorized for detection and/or diagnosis of SARS-CoV-2 by FDA under an Emergency Use Authorization (EUA).  This EUA will remain  in effect (meaning this test can be used) for the duration of the COVID-19  declaration under Section 564(b)(1) of the Act, 21 U.S.C. section 360bbb-3(b)(1), unless the authorization is terminated or revoked sooner. Performed at [facility], [address], [place], [place] [address]    Time coordinating discharge:     SIGNED:  Irwin Brakeman, MD  [facility] 08/27/2019, 12:25 PM How to contact the [facility] Attending or Consulting provider [place] or covering provider during after hours [place], for this patient?  1. Check the care team in [facility] and look for a) attending/consulting TR[facility] provider listed and b) the [facility] team listed 2. Log into [email or web address] and use [place]'s universal password to access. If you do not have the password, please contact the hospital operator. 3. Locate the [facility] provider you are looking for under [facility] and page to a number that you can be directly reached. 4. If you still have difficulty reaching the provider, please page the [facility] (Director on Call) for the Hospitalists listed on amion for assistance.

## 2019-08-27 NOTE — TOC Transition Note (Signed)
Transition of Care [facility]) - CM/SW Discharge Note  Patient Details  Name: Brynlei Leduff MRN: [id number] Date of Birth: 10/30/1940  [facility] CM/SW Contact:  Sherie Don, LCSW Phone Number: 08/27/2019, 1:27 PM  Clinical Narrative: CSW spoke with Cassandra with [facility]. Per Cassandra, patient has been approved for the hospice house. The number to call for report is [phone]. CSW completed Medical Necessity form and provided copy to 300 RN station. EMS to be called after rapid COVID test comes back negative. RN updated. TOC signing off.  Final next level of care: Other (comment)(Hospice house) Barriers to Discharge: Barriers Resolved  Patient Goals and CMS Choice Patient states their goals for this hospitalization and ongoing recovery are:: Discharge to hospice house CMS Medicare.gov Compare Post Acute Care list provided to:: Patient Represenative (must comment)(MariJo Nelson-Decker (family friend)) Choice offered to / list presented to : [facility] POA / [place]  Discharge Placement Patient to be transferred to facility by: Opal Name of family member notified: [facility] Nelson-Decker Patient and family notified of of transfer: 08/27/19  Discharge Plan and Services In-house Referral: Clinical Social Work Post Acute Care Choice: Hospice          DME Arranged: N/A DME Agency: NA HH Arranged: NA [place] Agency: NA  Readmission Risk Interventions No flowsheet data found.

## 2019-08-27 NOTE — Plan of Care (Signed)
Problem: Education: Goal: Knowledge of General Education information will improve Description: Including pain rating scale, medication(s)/side effects and non-pharmacologic comfort measures 08/27/2019 1447 by Melony Overly, RN Outcome: Not Applicable 2/72/5366 4403 by Melony Overly, RN Outcome: Not Met (add Reason)   Problem: Health Behavior/Discharge Planning: Goal: Ability to manage health-related needs will improve 08/27/2019 1447 by Melony Overly, RN Outcome: Not Applicable 4/74/2595 6387 by Melony Overly, RN Outcome: Not Met (add Reason)   Problem: Clinical Measurements: Goal: Ability to maintain clinical measurements within normal limits will improve 08/27/2019 1447 by Melony Overly, RN Outcome: Not Applicable 5/64/3329 5188 by Melony Overly, RN Outcome: Not Met (add Reason) Goal: Will remain free from infection 08/27/2019 1447 by Melony Overly, RN Outcome: Not Applicable 07/12/6061 0160 by Melony Overly, RN Outcome: Not Met (add Reason) Goal: Diagnostic test results will improve 08/27/2019 1447 by Melony Overly, RN Outcome: Not Applicable 04/06/3233 5732 by Melony Overly, RN Outcome: Not Met (add Reason) Goal: Respiratory complications will improve 08/27/2019 1447 by Melony Overly, RN Outcome: Not Applicable 04/30/5425 0623 by Melony Overly, RN Outcome: Not Met (add Reason) Goal: Cardiovascular complication will be avoided 08/27/2019 1447 by Melony Overly, RN Outcome: Not Applicable 7/62/8315 1761 by Melony Overly, RN Outcome: Not Met (add Reason)   Problem: Activity: Goal: Risk for activity intolerance will decrease 08/27/2019 1447 by Melony Overly, RN Outcome: Not Applicable 09/02/3708 6269 by Melony Overly, RN Outcome: Not Met (add Reason)   Problem: Nutrition: Goal: Adequate nutrition will be maintained 08/27/2019 1447 by Melony Overly, RN Outcome: Not Applicable 4/85/4627 0350 by Melony Overly, RN Outcome: Not Met (add  Reason)   Problem: Coping: Goal: Level of anxiety will decrease 08/27/2019 1447 by Melony Overly, RN Outcome: Not Applicable 0/93/8182 9937 by Melony Overly, RN Outcome: Not Met (add Reason)   Problem: Elimination: Goal: Will not experience complications related to bowel motility 08/27/2019 1447 by Melony Overly, RN Outcome: Not Applicable 1/69/6789 3810 by Melony Overly, RN Outcome: Not Met (add Reason) Goal: Will not experience complications related to urinary retention 08/27/2019 1447 by Melony Overly, RN Outcome: Not Applicable 1/75/1025 8527 by Melony Overly, RN Outcome: Not Met (add Reason)   Problem: Pain Managment: Goal: General experience of comfort will improve 08/27/2019 1447 by Melony Overly, RN Outcome: Not Applicable 7/82/4235 3614 by Melony Overly, RN Outcome: Not Met (add Reason)   Problem: Safety: Goal: Ability to remain free from injury will improve 08/27/2019 1447 by Melony Overly, RN Outcome: Not Applicable 4/31/5400 8676 by Melony Overly, RN Outcome: Not Met (add Reason)   Problem: Skin Integrity: Goal: Risk for impaired skin integrity will decrease 08/27/2019 1447 by Melony Overly, RN Outcome: Not Applicable 1/95/0932 6712 by Melony Overly, RN Outcome: Not Met (add Reason)   Problem: Education: Goal: Knowledge of secondary prevention will improve 08/27/2019 1447 by Melony Overly, RN Outcome: Not Applicable 4/58/0998 3382 by Melony Overly, RN Outcome: Not Met (add Reason) Goal: Knowledge of patient specific risk factors addressed and post discharge goals established will improve 08/27/2019 1447 by Melony Overly, RN Outcome: Not Applicable 08/01/3974 7341 by Melony Overly, RN Outcome: Not Met (add Reason) Goal: Individualized Educational Video(s) 08/27/2019 1447 by Melony Overly, RN Outcome: Not Applicable 9/37/9024 0973 by Melony Overly, RN Outcome: Not Met (add Reason)   Problem: Coping: Goal: Will identify  appropriate support needs 08/27/2019 1447 by Melony Overly, RN Outcome: Not Applicable 05/23/6718 9198 by Melony Overly, RN Outcome: Not Met (add Reason)   Problem: Nutrition: Goal: Dietary intake will improve 08/27/2019 1447 by Melony Overly, RN Outcome: Not Applicable 0/22/1798 1025 by Melony Overly, RN Outcome: Not Met (add Reason)

## 2019-08-30 ENCOUNTER — Ambulatory Visit: Payer: Medicare Other | Admitting: Orthopaedic Surgery

## 2019-09-27 DEATH — deceased

## 2022-03-27 IMAGING — DX DG CHEST 1V PORT
1 series · 1 of 1 positions shown · non-contrast
Comparison: 05/12/2019

CLINICAL DATA: Shortness of breath

EXAM:
PORTABLE CHEST 1 VIEW

[chest ap]
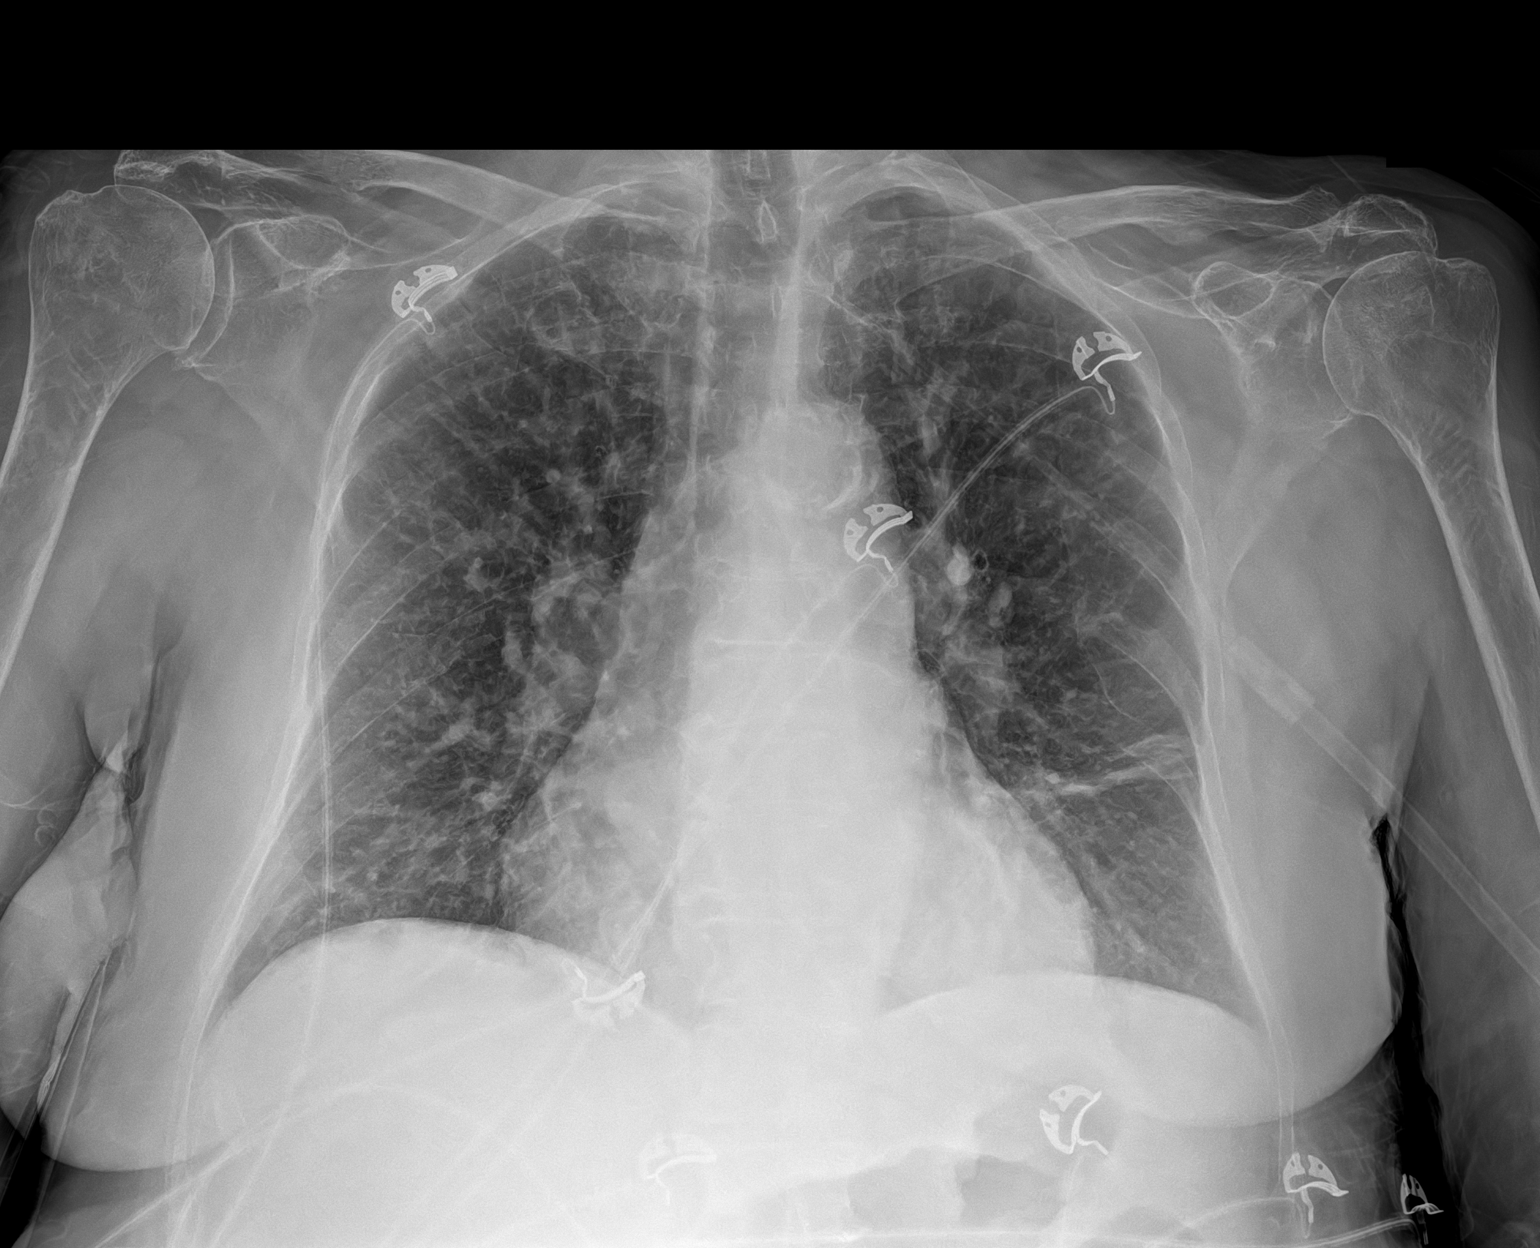

[1 of 1 positions shown; findings below may reference images not displayed]

FINDINGS: Mild cardiomegaly. Lingular atelectasis. No effusions or overt
edema. No acute bony abnormality.
IMPRESSION: Cardiomegaly, lingular atelectasis.

## 2022-04-29 IMAGING — DX DG CHEST 1V PORT
1 series · 1 of 1 positions shown · non-contrast
Comparison: 08/04/2019

CLINICAL DATA: Weakness and elevated ammonia level

EXAM:
PORTABLE CHEST 1 VIEW

[chest ap]
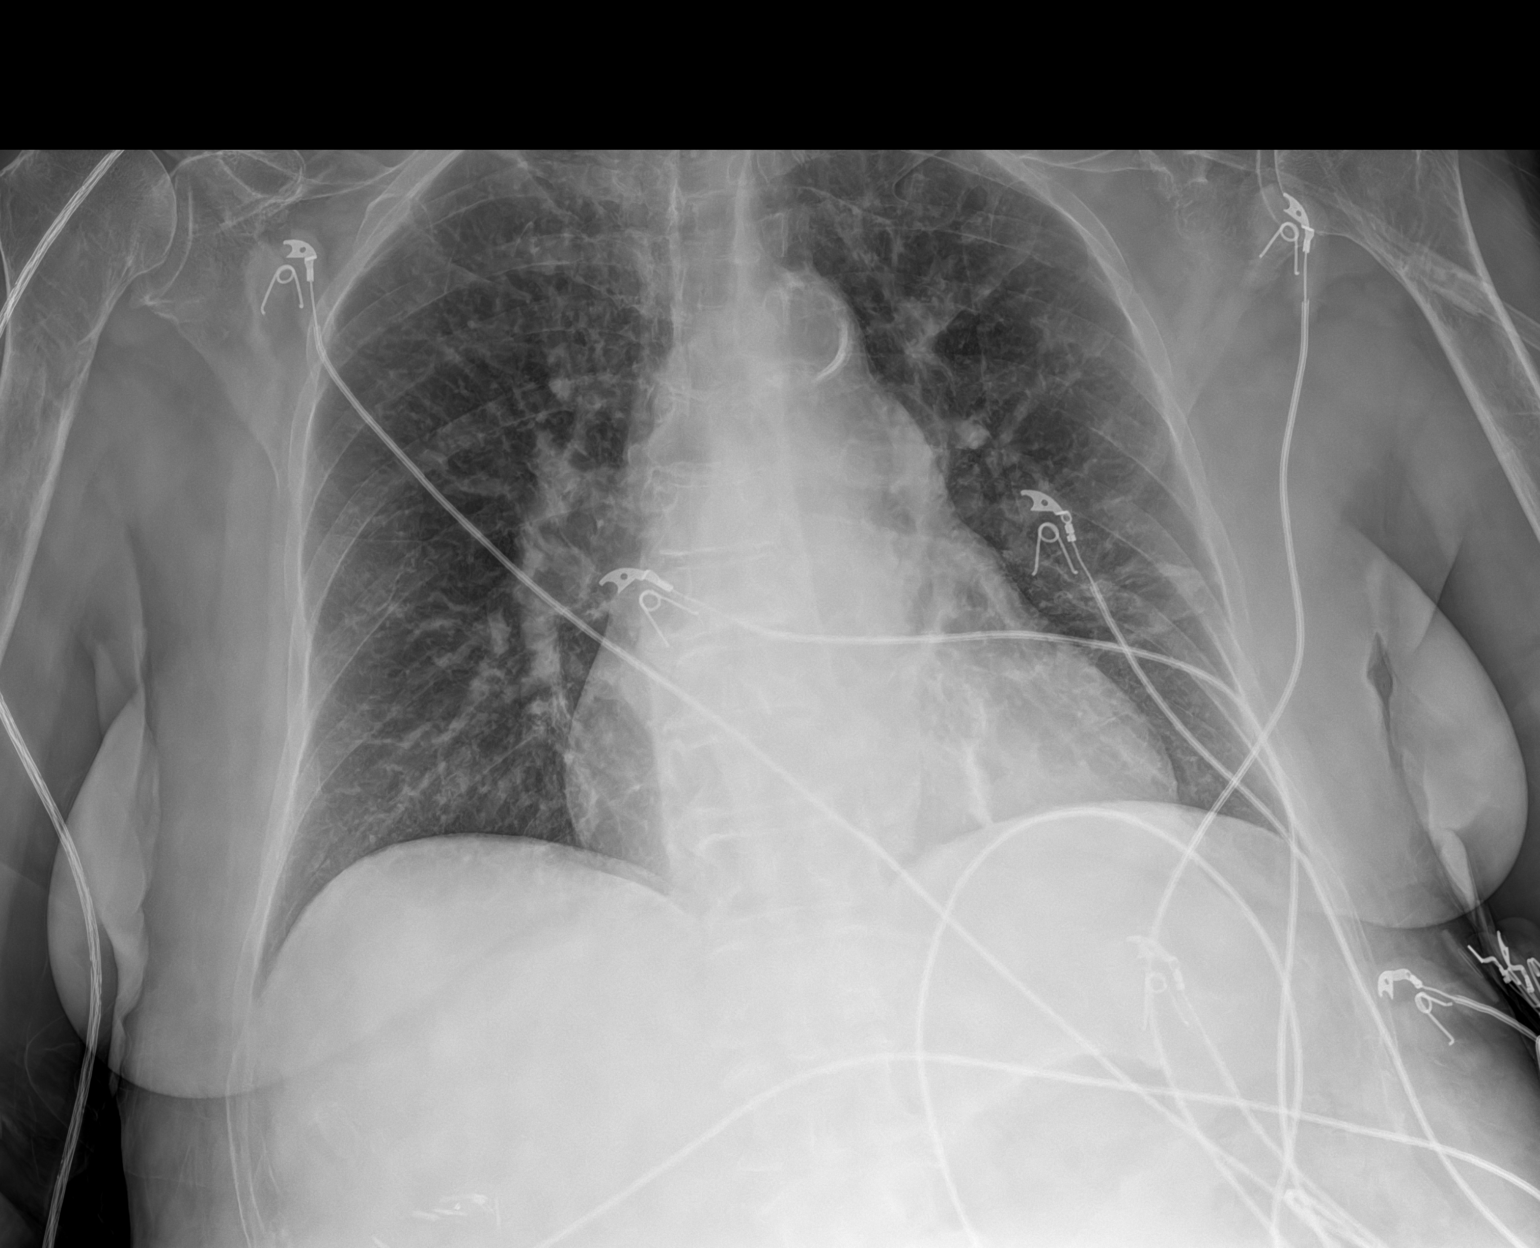

[1 of 1 positions shown; findings below may reference images not displayed]

FINDINGS: Cardiac shadow is enlarged but stable. Aortic calcifications are
again seen. The lungs are well aerated bilaterally. Mild vascular
congestion is again noted centrally. No edema is seen. No focal
infiltrate is noted. Mild scarring at the left base is noted.
IMPRESSION: Chronic changes without acute abnormality.

## 2022-05-27 ENCOUNTER — Encounter: Payer: Self-pay | Admitting: Radiology
